# Patient Record
Sex: Male | Born: 1940 | Race: White
Health system: Southern US, Community
[De-identification: ages and names within clinical notes are randomized; demographics above are authoritative.]

## PROBLEM LIST (undated history)

## (undated) DIAGNOSIS — I2510 Atherosclerotic heart disease of native coronary artery without angina pectoris: Secondary | ICD-10-CM

## (undated) DIAGNOSIS — Z955 Presence of coronary angioplasty implant and graft: Secondary | ICD-10-CM

## (undated) DIAGNOSIS — G20 Parkinson's disease: Secondary | ICD-10-CM

## (undated) DIAGNOSIS — D649 Anemia, unspecified: Secondary | ICD-10-CM

## (undated) DIAGNOSIS — I10 Essential (primary) hypertension: Secondary | ICD-10-CM

## (undated) DIAGNOSIS — E1121 Type 2 diabetes mellitus with diabetic nephropathy: Secondary | ICD-10-CM

## (undated) DIAGNOSIS — E119 Type 2 diabetes mellitus without complications: Secondary | ICD-10-CM

## (undated) DIAGNOSIS — K7689 Other specified diseases of liver: Secondary | ICD-10-CM

## (undated) DIAGNOSIS — E785 Hyperlipidemia, unspecified: Secondary | ICD-10-CM

## (undated) DIAGNOSIS — G20A1 Parkinson's disease without dyskinesia, without mention of fluctuations: Secondary | ICD-10-CM

## (undated) DIAGNOSIS — Z951 Presence of aortocoronary bypass graft: Secondary | ICD-10-CM

## (undated) HISTORY — DX: Presence of coronary angioplasty implant and graft: Z95.5

## (undated) HISTORY — DX: Essential (primary) hypertension: I10

## (undated) HISTORY — DX: Hyperlipidemia, unspecified: E78.5

## (undated) HISTORY — DX: Atherosclerotic heart disease of native coronary artery without angina pectoris: I25.10

## (undated) HISTORY — PX: CARDIAC SURGERY: SHX584

---

## 2004-03-17 DIAGNOSIS — Z951 Presence of aortocoronary bypass graft: Secondary | ICD-10-CM

## 2004-03-17 HISTORY — DX: Presence of aortocoronary bypass graft: Z95.1

## 2013-08-28 DIAGNOSIS — N201 Calculus of ureter: Secondary | ICD-10-CM | POA: Insufficient documentation

## 2013-08-28 DIAGNOSIS — K7689 Other specified diseases of liver: Secondary | ICD-10-CM | POA: Insufficient documentation

## 2013-08-28 DIAGNOSIS — M25519 Pain in unspecified shoulder: Secondary | ICD-10-CM | POA: Insufficient documentation

## 2013-08-28 DIAGNOSIS — M545 Low back pain, unspecified: Secondary | ICD-10-CM | POA: Insufficient documentation

## 2013-08-28 DIAGNOSIS — E119 Type 2 diabetes mellitus without complications: Secondary | ICD-10-CM | POA: Insufficient documentation

## 2013-08-28 DIAGNOSIS — M109 Gout, unspecified: Secondary | ICD-10-CM | POA: Insufficient documentation

## 2013-08-28 DIAGNOSIS — K224 Dyskinesia of esophagus: Secondary | ICD-10-CM | POA: Insufficient documentation

## 2013-08-28 DIAGNOSIS — R351 Nocturia: Secondary | ICD-10-CM | POA: Insufficient documentation

## 2013-08-28 DIAGNOSIS — H469 Unspecified optic neuritis: Secondary | ICD-10-CM | POA: Insufficient documentation

## 2013-08-28 DIAGNOSIS — I10 Essential (primary) hypertension: Secondary | ICD-10-CM | POA: Insufficient documentation

## 2013-08-28 DIAGNOSIS — E7849 Other hyperlipidemia: Secondary | ICD-10-CM | POA: Insufficient documentation

## 2013-08-28 DIAGNOSIS — R430 Anosmia: Secondary | ICD-10-CM | POA: Insufficient documentation

## 2013-08-28 DIAGNOSIS — I2510 Atherosclerotic heart disease of native coronary artery without angina pectoris: Secondary | ICD-10-CM | POA: Insufficient documentation

## 2013-10-07 DIAGNOSIS — R911 Solitary pulmonary nodule: Secondary | ICD-10-CM | POA: Insufficient documentation

## 2014-04-25 DIAGNOSIS — F32A Depression, unspecified: Secondary | ICD-10-CM | POA: Insufficient documentation

## 2014-04-25 DIAGNOSIS — R251 Tremor, unspecified: Secondary | ICD-10-CM | POA: Insufficient documentation

## 2014-04-25 DIAGNOSIS — G20 Parkinson's disease: Secondary | ICD-10-CM | POA: Insufficient documentation

## 2015-07-10 DIAGNOSIS — E1121 Type 2 diabetes mellitus with diabetic nephropathy: Secondary | ICD-10-CM | POA: Insufficient documentation

## 2015-07-10 DIAGNOSIS — I10 Essential (primary) hypertension: Secondary | ICD-10-CM | POA: Insufficient documentation

## 2015-08-05 DIAGNOSIS — D729 Disorder of white blood cells, unspecified: Secondary | ICD-10-CM | POA: Insufficient documentation

## 2015-08-05 DIAGNOSIS — I209 Angina pectoris, unspecified: Secondary | ICD-10-CM | POA: Insufficient documentation

## 2015-08-05 DIAGNOSIS — E785 Hyperlipidemia, unspecified: Secondary | ICD-10-CM | POA: Insufficient documentation

## 2015-08-05 DIAGNOSIS — Z951 Presence of aortocoronary bypass graft: Secondary | ICD-10-CM | POA: Insufficient documentation

## 2016-10-28 DIAGNOSIS — F330 Major depressive disorder, recurrent, mild: Secondary | ICD-10-CM | POA: Insufficient documentation

## 2018-01-01 ENCOUNTER — Emergency Department (HOSPITAL_BASED_OUTPATIENT_CLINIC_OR_DEPARTMENT_OTHER)
Admission: EM | Admit: 2018-01-01 | Discharge: 2018-01-01 | Disposition: A | Discharge status: Home / Self Care | Payer: Medicare Other | Attending: Emergency Medicine | Admitting: Emergency Medicine

## 2018-01-01 ENCOUNTER — Encounter (HOSPITAL_BASED_OUTPATIENT_CLINIC_OR_DEPARTMENT_OTHER): Payer: Self-pay | Admitting: Emergency Medicine

## 2018-01-01 ENCOUNTER — Emergency Department (HOSPITAL_BASED_OUTPATIENT_CLINIC_OR_DEPARTMENT_OTHER): Payer: Medicare Other

## 2018-01-01 ENCOUNTER — Other Ambulatory Visit: Payer: Self-pay

## 2018-01-01 DIAGNOSIS — M545 Low back pain, unspecified: Secondary | ICD-10-CM

## 2018-01-01 DIAGNOSIS — Z951 Presence of aortocoronary bypass graft: Secondary | ICD-10-CM | POA: Insufficient documentation

## 2018-01-01 DIAGNOSIS — E119 Type 2 diabetes mellitus without complications: Secondary | ICD-10-CM | POA: Diagnosis not present

## 2018-01-01 DIAGNOSIS — G20 Parkinson's disease: Secondary | ICD-10-CM | POA: Diagnosis not present

## 2018-01-01 DIAGNOSIS — I10 Essential (primary) hypertension: Secondary | ICD-10-CM | POA: Insufficient documentation

## 2018-01-01 HISTORY — DX: Parkinson's disease: G20

## 2018-01-01 HISTORY — DX: Parkinson's disease without dyskinesia, without mention of fluctuations: G20.A1

## 2018-01-01 HISTORY — DX: Essential (primary) hypertension: I10

## 2018-01-01 HISTORY — DX: Type 2 diabetes mellitus without complications: E11.9

## 2018-01-01 HISTORY — DX: Presence of aortocoronary bypass graft: Z95.1

## 2018-01-01 MED ORDER — CYCLOBENZAPRINE HCL 10 MG PO TABS: 10.0000 mg | ORAL_TABLET | Freq: Two times a day (BID) | ORAL | 0 refills | Status: DC | PRN

## 2018-01-01 MED ORDER — KETOROLAC TROMETHAMINE 30 MG/ML IJ SOLN
15.0000 mg | Freq: Once | INTRAMUSCULAR | Status: AC
  Administered 2018-01-01: 15 mg via INTRAMUSCULAR
  Filled 2018-01-01: qty 1

## 2018-01-01 MED ORDER — CYCLOBENZAPRINE HCL 5 MG PO TABS: 5.0000 mg | ORAL_TABLET | Freq: Two times a day (BID) | ORAL | 0 refills | Status: AC | PRN

## 2018-01-01 NOTE — Discharge Instructions (Addendum)
You can take ibuprofen as prescribed over-the-counter, as needed for your pain.  For muscle pain or spasms, take Flexeril twice daily.  Do not drive or operate machinery while taking this medication.  Use ice and heat alternating 20 minutes on, 20 minutes off.  Please return or go to the closest emergency department if you develop any new or worsening symptoms.

## 2018-01-01 NOTE — ED Provider Notes (Signed)
[facility] EMERGENCY DEPARTMENT Provider Note   CSN: [id number] Arrival date & time: 01/01/18  1131     History   Chief Complaint Chief Complaint  Patient presents with  . Back Pain    HPI Erik Velazquez is a 77 y.o. male with history of Parkinson's disease, hypertension, diabetes, coronary artery disease who presents with a 3-day history of left-sided low back pain.  Patient reports his pain began after a trip to [place] when he was lifting suitcases and sitting on the airplane a lot.  He reports it improved, however flared up again last week when he lifted a suitcase.  He has had pain across his low back, worse in the left side.  It is worse when he moves.  He denies any numbness or tingling, saddle anesthesia, bowel or bladder incontinence, history of IVDU, cancer, procedure to back, or fevers.  He denies any chest pain, shortness of breath.  He has no personal history of AAA.  HPI  Past Medical History:  Diagnosis Date  . Diabetes mellitus without complication (HCC)   . 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 Hypertension   . Parkinson disease (HCC)     There are no active problems to display for this patient.   Past Surgical History:  Procedure Laterality Date  . CARDIAC SURGERY          Home Medications    Prior to Admission medications   Medication Sig Start Date End Date Taking? Authorizing Provider  cyclobenzaprine (FLEXERIL) 10 MG tablet Take 1 tablet (10 mg total) by mouth 2 (two) times daily as needed for muscle spasms. 01/01/18   Emi HolesLaw, Earnstine Meinders M, PA-C    Family History History reviewed. No pertinent family history.  Social History Social History   Tobacco Use  . Smoking status: Never Smoker  . Smokeless tobacco: Never Used  Substance Use Topics  . Alcohol use: Not Currently    Frequency: Never  . Drug use: Never     Allergies   Patient has no allergy information on record.   Review of Systems Review of Systems  Constitutional:  Negative for fever.  Respiratory: Negative for shortness of breath.   Cardiovascular: Negative for chest pain.  Genitourinary: Negative for dysuria and frequency.  Musculoskeletal: Positive for back pain.  Neurological: Negative for numbness.     Physical Exam Updated Vital Signs BP 140/83 (BP Location: Right Arm)   Pulse 70   Temp 98 F (36.7 C) (Oral)   Resp 16   SpO2 99%   Physical Exam  Constitutional: He appears well-developed and well-nourished. No distress.  HENT:  Head: Normocephalic and atraumatic.  Mouth/Throat: Oropharynx is clear and moist. No oropharyngeal exudate.  Eyes: Pupils are equal, round, and reactive to light. Conjunctivae are normal. Right eye exhibits no discharge. Left eye exhibits no discharge. No scleral icterus.  Neck: Normal range of motion. Neck supple. No thyromegaly present.  Cardiovascular: Normal rate, regular rhythm, normal heart sounds and intact distal pulses. Exam reveals no gallop and no friction rub.  No murmur heard. Pulmonary/Chest: Effort normal and breath sounds normal. No stridor. No respiratory distress. He has no wheezes. He has no rales.  Abdominal: Soft. Bowel sounds are normal. He exhibits no distension and no pulsatile midline mass. There is no tenderness. There is no rebound and no guarding.  Musculoskeletal: He exhibits no edema.  Some left-sided lumbar paraspinal tenderness, no midline cervical, thoracic, or lumbar tenderness  Lymphadenopathy:    He has no  cervical adenopathy.  Neurological: He is alert. Coordination normal.  Skin: Skin is warm and dry. No rash noted. He is not diaphoretic. No pallor.  Psychiatric: He has a normal mood and affect.  Nursing note and vitals reviewed.    ED Treatments / Results  Labs (all labs ordered are listed, but only abnormal results are displayed) Labs Reviewed - No data to display  EKG None  Radiology Dg Lumbar Spine Complete  Result Date: 01/01/2018 CLINICAL DATA:  77 y/o M;  lifting injury 3 days ago. Bilateral lower back pain. EXAM: LUMBAR SPINE - COMPLETE 4+ VIEW COMPARISON:  None. FINDINGS: Five lumbar type non-rib-bearing vertebral bodies. Normal lumbar lordosis without listhesis. Vertebral body and disc space heights are maintained. Multilevel endplate marginal osteophytes. Calcific atherosclerosis of the abdominal aorta. Mild lower lumbar facet arthrosis. IMPRESSION: 1. No acute fracture or dislocation. 2. Mild lumbar spondylosis. Electronically Signed   By: Mitzi Hansen M.D.   On: 01/01/2018 13:58    Procedures Procedures (including critical care time)  Medications Ordered in ED Medications  ketorolac (TORADOL) 30 MG/ML injection 15 mg (15 mg Intramuscular Given 01/01/18 1321)     Initial Impression / Assessment and Plan / ED Course  I have reviewed the triage vital signs and the nursing notes.  Pertinent labs & imaging results that were available during my care of the patient were reviewed by me and considered in my medical decision making (see chart for details).  Clinical Course as of Jan 02 1551  Sat Jan 01, 2018  [id number] 77 year old male here with some low back pain.  He attributes it to lifting something heavy and he has had this happen before.  He is otherwise neuro intact and is got a benign abdominal exam.  X-rays do not show any obvious fractures.  We will treat him symptomatically and have him follow-up with his primary care doctor.   [MB]    Clinical Course User Index [MB] Terrilee Files, MD    Patient with low back pain after lifting a heavy suitcase.  Suspect musculoskeletal pain.  He is neurovascularly intact.  X-ray is negative for acute findings but shows mild spondylosis.  Patient has no pulsatile mass.  He has a mechanism of injury.  Low suspicion of AAA.  Patient is completely improved after 15 mg of Toradol.  Patient is requesting Flexeril.  Patient given low-dose Flexeril, however there was an error in E prescribing and  patient was prescribed 10 mg instead.  I called [facility] and several other [facility] multiple times and could not contact anyone.  There is no voicemail set up.  I also tried to call the patient several times and there is also no voicemail set up.  The prescription was changed electronically however I was unable to confirm the change or make the patient aware to take half a tablet.  Patient also evaluated by my attending, Dr. Charm Barges, who guided the patient's management and agrees with plan.  Final Clinical Impressions(s) / ED Diagnoses   Final diagnoses:  Acute left-sided low back pain without sciatica    ED Discharge Orders         Ordered    cyclobenzaprine (FLEXERIL) 10 MG tablet  2 times daily PRN     01/01/18 1440           Emi Holes, [place] 01/01/18 1825    Terrilee Files, MD 01/02/18 [phone]

## 2018-01-01 NOTE — ED Triage Notes (Signed)
Pt c/o left sided lower back pain x 3 days with radiation down leg. No urinary sx or radiation to abdomen

## 2019-01-31 IMAGING — CR DG LUMBAR SPINE COMPLETE 4+V
5 series · 5 of 5 positions shown · non-contrast
Comparison: None.

CLINICAL DATA: 77 y/o M; lifting injury 3 days ago. Bilateral lower
back pain.

EXAM:
LUMBAR SPINE - COMPLETE 4+ VIEW

[t l-spine a.p.]
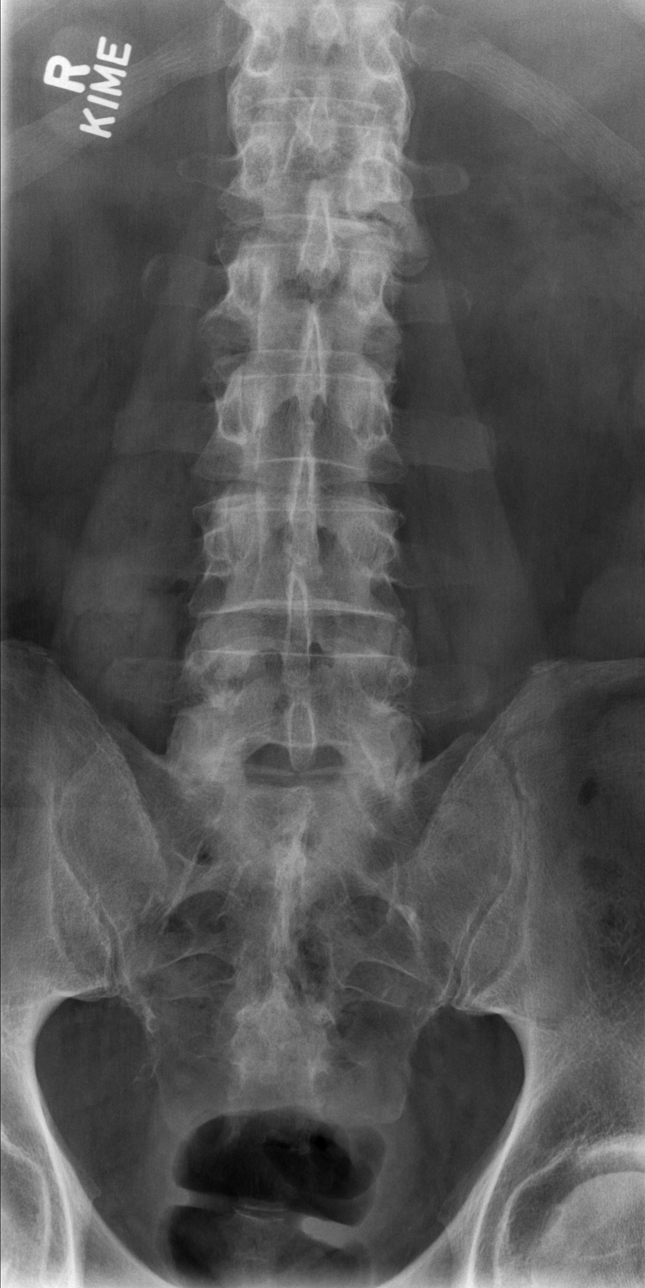

[t l-spine oblique exposure (1 of 2)]
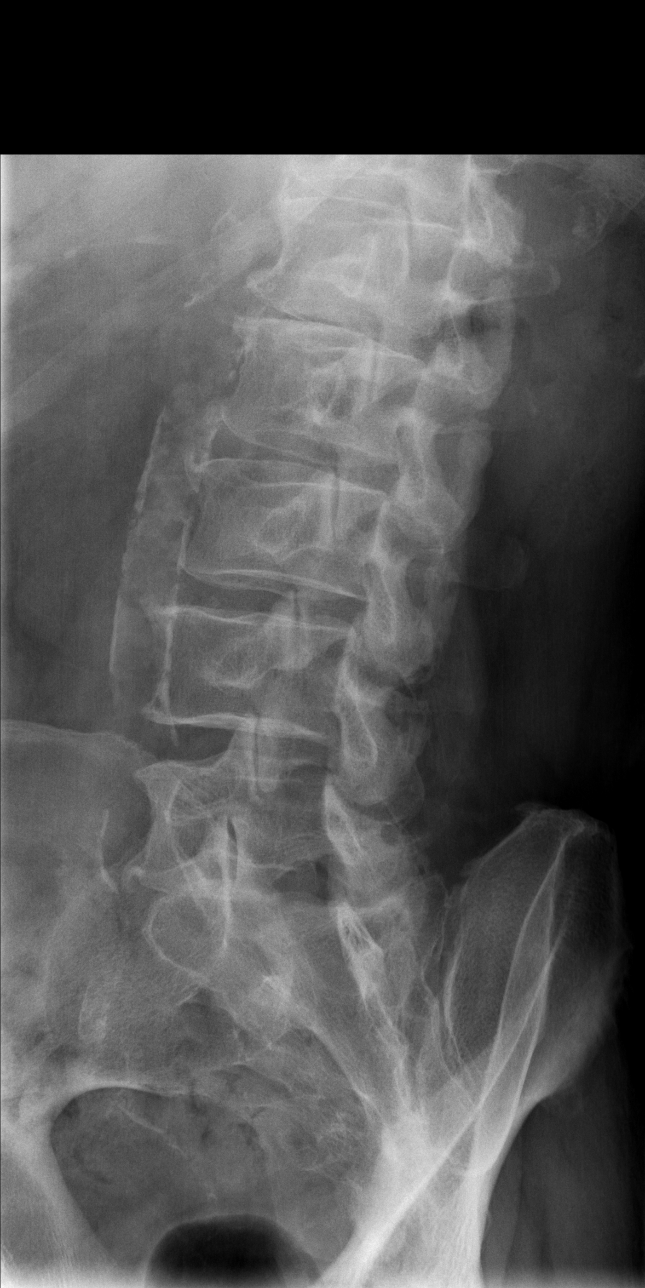

[t l-spine oblique exposure (2 of 2)]
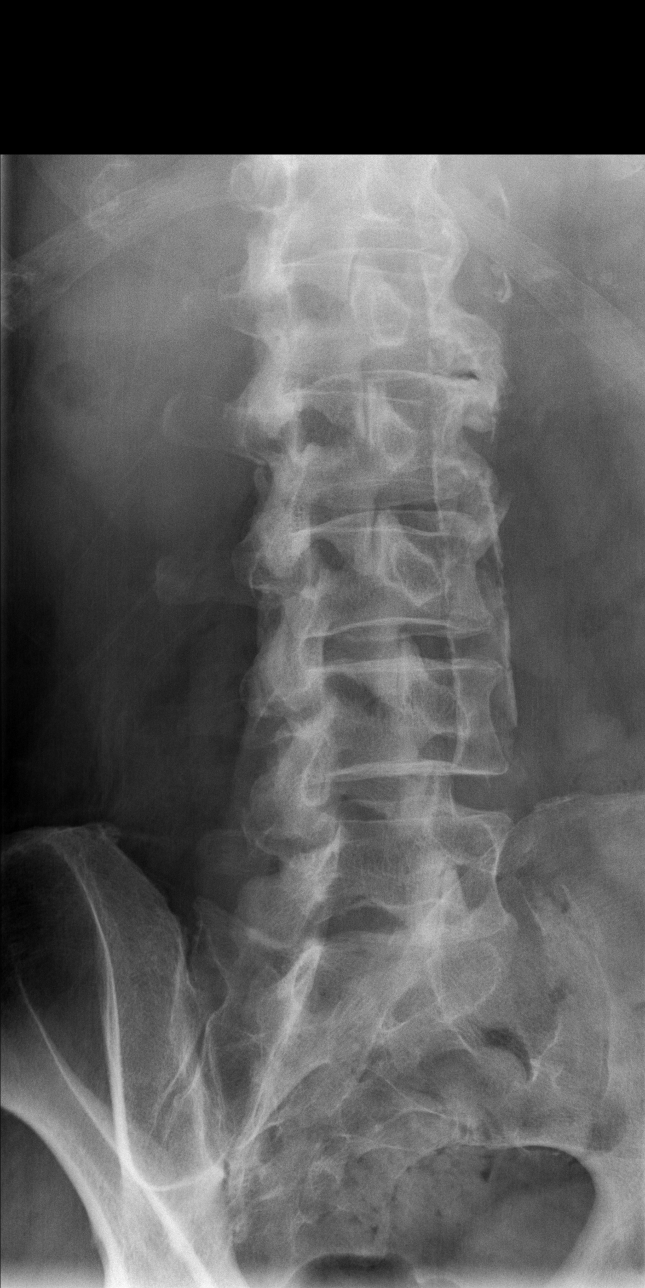

[t l-spine lat]
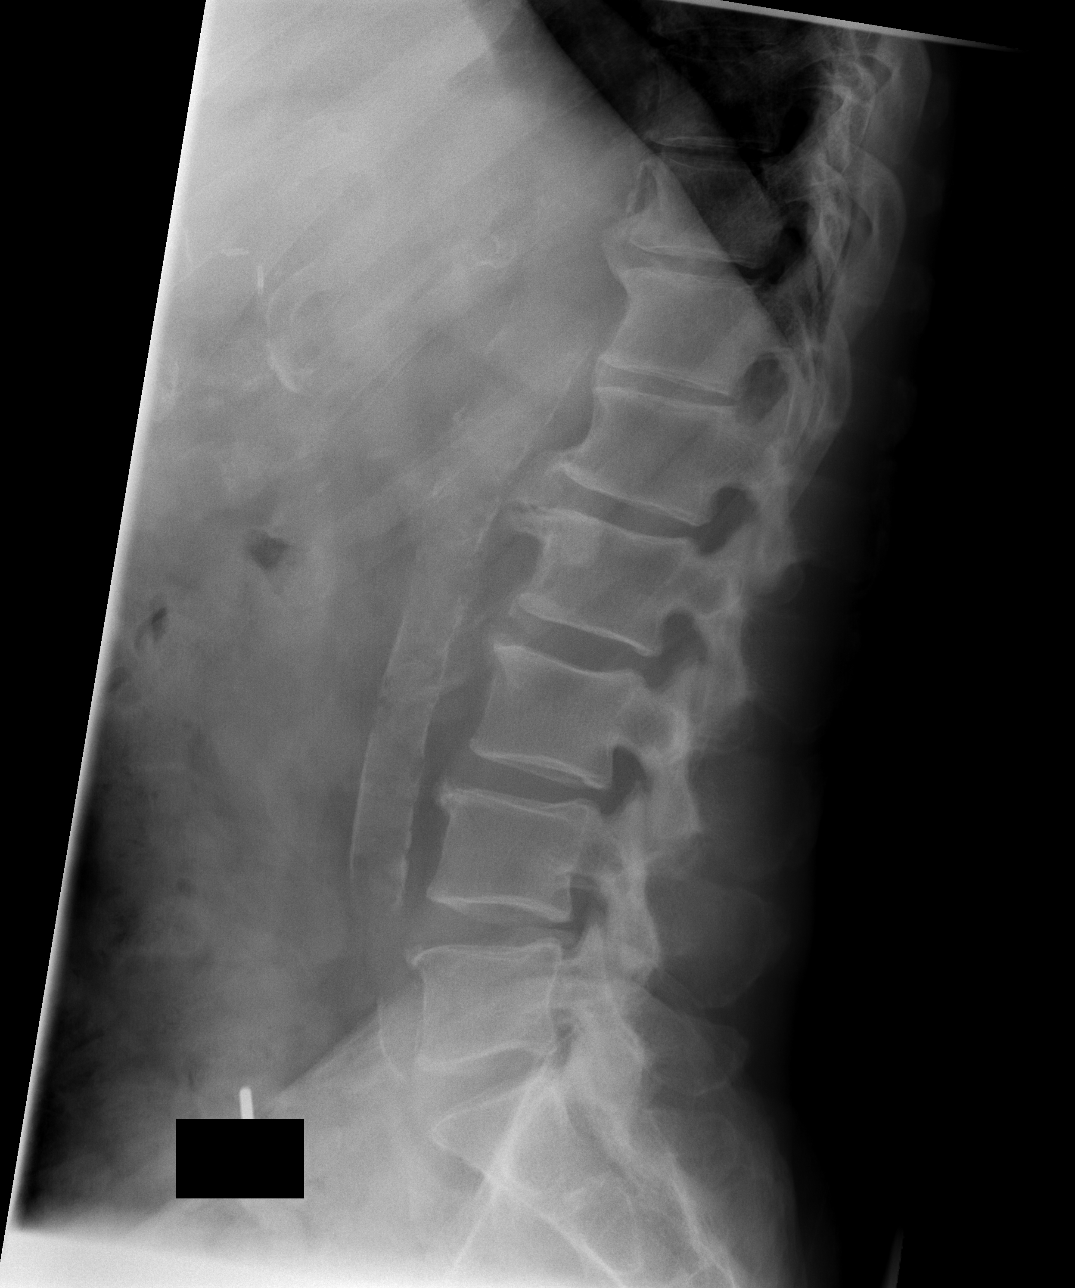

[t l-spine l5-s1 spot]
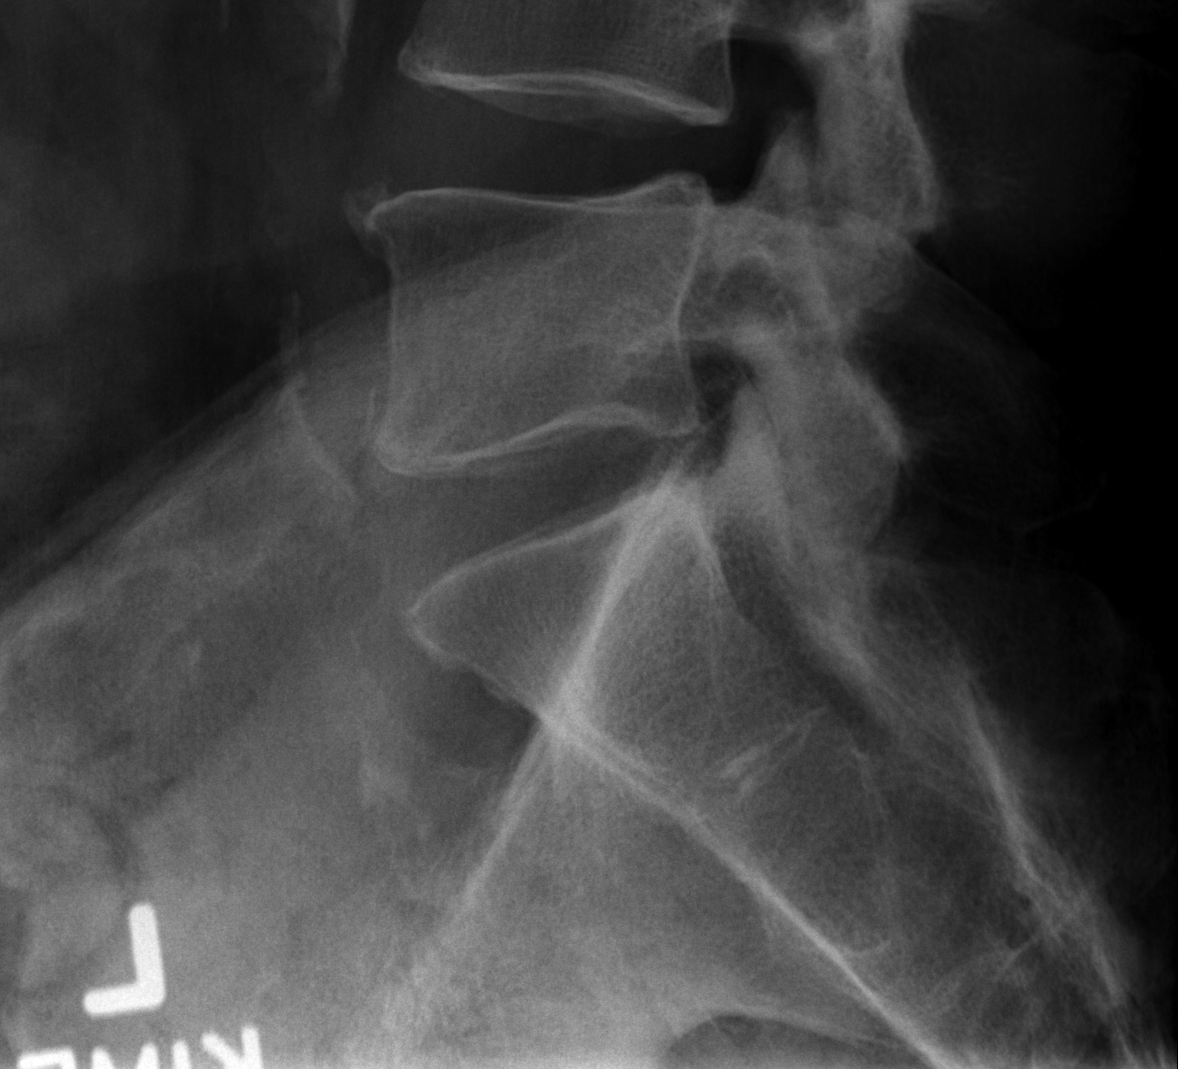

[5 of 5 positions shown; findings below may reference images not displayed]

FINDINGS: Five lumbar type non-rib-bearing vertebral bodies. Normal lumbar
lordosis without listhesis. Vertebral body and disc space heights
are maintained. Multilevel endplate marginal osteophytes. Calcific
atherosclerosis of the abdominal aorta. Mild lower lumbar facet
arthrosis.
IMPRESSION: 1. No acute fracture or dislocation.
2. Mild lumbar spondylosis.

## 2019-03-23 ENCOUNTER — Ambulatory Visit (INDEPENDENT_AMBULATORY_CARE_PROVIDER_SITE_OTHER): Payer: Self-pay | Admitting: Family Medicine

## 2019-04-06 ENCOUNTER — Encounter (INDEPENDENT_AMBULATORY_CARE_PROVIDER_SITE_OTHER): Payer: Self-pay | Admitting: Family Medicine

## 2019-04-06 ENCOUNTER — Ambulatory Visit (INDEPENDENT_AMBULATORY_CARE_PROVIDER_SITE_OTHER): Payer: No Typology Code available for payment source | Admitting: Family Medicine

## 2019-04-06 VITALS — BP 124/64 | HR 84 | Temp 97.6°F | Resp 14 | Wt 172.4 lb

## 2019-04-06 DIAGNOSIS — F339 Major depressive disorder, recurrent, unspecified: Secondary | ICD-10-CM

## 2019-04-06 DIAGNOSIS — E782 Mixed hyperlipidemia: Secondary | ICD-10-CM

## 2019-04-06 DIAGNOSIS — E119 Type 2 diabetes mellitus without complications: Secondary | ICD-10-CM

## 2019-04-06 DIAGNOSIS — I10 Essential (primary) hypertension: Secondary | ICD-10-CM

## 2019-04-06 DIAGNOSIS — G20 Parkinson's disease: Secondary | ICD-10-CM

## 2019-04-06 DIAGNOSIS — M109 Gout, unspecified: Secondary | ICD-10-CM

## 2019-04-06 MED ORDER — LOSARTAN POTASSIUM 25 MG PO TABS
ORAL_TABLET | ORAL | 3 refills | Status: DC
Start: 2019-04-06 — End: 2019-07-03

## 2019-04-06 MED ORDER — ACCU-CHEK AVIVA PLUS W/DEVICE KIT
PACK | 1 refills | Status: DC
Start: 2019-04-06 — End: 2019-05-12

## 2019-04-06 MED ORDER — ATORVASTATIN CALCIUM 20 MG PO TABS
ORAL_TABLET | ORAL | 4 refills | Status: DC
Start: 2019-04-06 — End: 2019-07-03

## 2019-04-06 MED ORDER — METFORMIN HCL 1000 MG PO TABS
ORAL_TABLET | ORAL | 4 refills | Status: DC
Start: 2019-04-06 — End: 2019-07-03

## 2019-04-06 MED ORDER — SERTRALINE HCL 25 MG PO TABS
ORAL_TABLET | ORAL | 4 refills | Status: DC
Start: 2019-04-06 — End: 2019-07-03

## 2019-04-06 MED ORDER — ALLOPURINOL 300 MG PO TABS
ORAL_TABLET | ORAL | 4 refills | Status: DC
Start: 2019-04-06 — End: 2019-07-03

## 2019-04-06 NOTE — Progress Notes (Signed)
[facility] [place] - AN [place] PART[facility]                       Date of Exam: 04/06/2019        Patient ID: [name] [name] is a 79 y.o. male.  Attending Physician: [name], MD        Chief Complaint:    Chief Complaint   Patient presents with   . Establish Care   . Medication Refill     losartan               HPI:    New patient  Here with son  PD VV with neuro 3 weeks ago    DM: lows but has not checked bs   Went to urgent care for med refills   On metformin and glipizide.     HTN: bp stable  Needs refill on losartan     Gout: takes allopurinol daily   Has not had gouty attack in years    HLD:   Needs refill    He has depression  He would like to continue zoloft.             Problem List:    Patient Active Problem List   Diagnosis   . Parkinson disease   . Type 2 diabetes mellitus without complication, without long-term current use of insulin   . Episode of recurrent major depressive disorder, unspecified depression episode severity             Current Meds:    Current Outpatient Medications on File Prior to Visit   Medication Sig Dispense Refill   . Ascorbic Acid (VITAMIN C PO) Take 1,000 mg by mouth     . aspirin EC 81 MG EC tablet Take by mouth     . carbidopa-levodopa (SINEMET) 25-100 MG per tablet TAKE 5 TABLETS DAILY AS DIRECTED  1.5 EACH AT 8 AM AND 1 PM, AND 1 TABLET EACH AT 6 PM AND 11PM     . Coenzyme Q10 (CoQ10) 200 MG Cap Take 400 mg by mouth     . glipiZIDE XL (GLUCOTROL XL) 5 MG 24 hr tablet Take 5 mg by mouth daily     . glucosamine-chondroitin 500-400 MG tablet Take 1 tablet by mouth     . glucose blood (Precision QID Test) test strip 1 each by Other route daily.     . Lancets Misc 1 each     . Multiple Vitamins-Minerals (CENTRUM SILVER 50+MEN PO) Take 1 tablet by mouth     . nitroglycerin (NITROSTAT) 0.4 MG SL tablet Place 0.4 mg under the tongue     . vitamin C (ASCORBIC ACID) 500 MG tablet Take 500 mg by mouth       No current facility-administered medications on  file prior to visit.           Allergies:    No Known Allergies          Past Surgical History:    History reviewed. No pertinent surgical history.        Family History:    History reviewed. No pertinent family history.        Social History:    Social History     Tobacco Use   . Smoking status: Never Smoker   . Smokeless tobacco: Never Used   Substance Use Topics   . Alcohol use: Not on file   . Drug use:  Not on file          The following sections were reviewed this encounter by the provider:   Tobacco  Allergies  Meds  Problems  Med Hx  Surg Hx  Fam Hx             Vital Signs:    BP 124/64   Pulse 84   Temp 97.6 F (36.4 C)   Resp 14   Wt 78.2 kg (172 lb 6.4 oz)          ROS:    Review of Systems   Constitutional: Negative for activity change and appetite change.   HENT: Negative for ear discharge and ear pain.    Eyes: Negative for discharge.   Respiratory: Negative for apnea and shortness of breath.    Cardiovascular: Negative for chest pain.   Gastrointestinal: Negative for abdominal distention.   Endocrine: Negative for cold intolerance.   Genitourinary: Negative for difficulty urinating.   Musculoskeletal: Negative for arthralgias.              Physical Exam:    Physical Exam  Constitutional:       Appearance: Normal appearance.   HENT:      Head: Normocephalic.      Nose: Nose normal.   Eyes:      Extraocular Movements: Extraocular movements intact.   Cardiovascular:      Rate and Rhythm: Normal rate.      Heart sounds: No murmur.   Pulmonary:      Effort: Pulmonary effort is normal.      Breath sounds: Normal breath sounds.   Abdominal:      General: There is no distension.      Palpations: There is no mass.   Musculoskeletal:      Right lower leg: No edema.      Left lower leg: No edema.   Skin:     General: Skin is dry.   Neurological:      Mental Status: He is alert.      Comments: Pin rolling tremor BL   Psychiatric:         Mood and Affect: Mood normal.         Thought Content: Thought  content normal.         Judgment: Judgment normal.              Assessment:    1. Parkinson disease  - Neurology Referral: Lynann Bologna, MD ([place])  - Referral to Physical Therapy-[facility]    2. Type 2 diabetes mellitus without complication, without long-term current use of insulin  - Comprehensive metabolic panel  - Lipid panel  - Hemoglobin A1C  - Urine Microalbumin Random  - metFORMIN (GLUCOPHAGE) 1000 MG tablet; TAKE 1 TABLET (1,000 MG TOTAL) BY MOUTH 2 TIMES DAILY WITH MEALS.  Dispense: 180 tablet; Refill: 4  - sertraline (ZOLOFT) 25 MG tablet; TAKE 1 TABLET (25 MG TOTAL) BY MOUTH ONCE DAILY.  Dispense: 90 tablet; Refill: 4  - Blood Glucose Monitoring Suppl (Accu-Chek Aviva Plus) w/Device Kit; Use as Directed  Dispense: 1 kit; Refill: 1    3. Episode of recurrent major depressive disorder, unspecified depression episode severity    4. Gout, unspecified cause, unspecified chronicity, unspecified site  - allopurinol (ZYLOPRIM) 300 MG tablet; TAKE 1 TABLET (300 MG TOTAL) BY MOUTH DAILY FOR 30 DAYS. NO FURTHER REFILLS WITH OUT OFFICE VISIT  Dispense: 90 tablet; Refill: 4    5. Mixed  hyperlipidemia  - atorvastatin (LIPITOR) 20 MG tablet; TAKE 1 TABLET BY MOUTH AT BEDTIME, NEEDS OFFICE VISIT FOR MORE REFILLS  Dispense: 90 tablet; Refill: 4    6. Essential hypertension  - losartan (COZAAR) 25 MG tablet; TAKE 1 TABLET BY MOUTH DAILY. MUST MAKE APPOINTMENT FOR ADDITIONAL REFILLS  Dispense: 90 tablet; Refill: 3            Plan:        New patient from [place], here with son who he lives with. Needs new neuroligst  Check labs for DM and HLD. IF a1c less than 7 will discontinue glipizide.   Start checking BS daily.          Follow-up:    Return in about 4 weeks (around 05/04/2019) for dm.         [name], MD

## 2019-04-07 ENCOUNTER — Encounter (INDEPENDENT_AMBULATORY_CARE_PROVIDER_SITE_OTHER): Payer: Self-pay | Admitting: Family Medicine

## 2019-04-08 LAB — COMPREHENSIVE METABOLIC PANEL
ALT: 15 IU/L (ref 0–44)
AST (SGOT): 23 IU/L (ref 0–40)
African American eGFR: 78 mL/min/{1.73_m2} (ref >59)
Albumin/Globulin Ratio: 1.6 (ref 1.2–2.2)
Albumin: 4.2 g/dL (ref 3.7–4.7)
Alkaline Phosphatase: 81 IU/L (ref 39–117)
BUN / Creatinine Ratio: 19 (ref 10–24)
BUN: 20 mg/dL (ref 8–27)
Bilirubin, Total: 0.7 mg/dL (ref 0.0–1.2)
CO2: 22 mmol/L (ref 20–29)
Calcium: 9.7 mg/dL (ref 8.6–10.2)
Chloride: 101 mmol/L (ref 96–106)
Creatinine: 1.06 mg/dL (ref 0.76–1.27)
Globulin, Total: 2.7 g/dL (ref 1.5–4.5)
Glucose: 164 mg/dL — ABNORMAL HIGH (ref 65–99)
Potassium: 4.4 mmol/L (ref 3.5–5.2)
Protein, Total: 6.9 g/dL (ref 6.0–8.5)
Sodium: 140 mmol/L (ref 134–144)
non-African American eGFR: 68 mL/min/{1.73_m2} (ref >59)

## 2019-04-08 LAB — LIPID PANEL
Cholesterol / HDL Ratio: 3.2 ratio (ref 0.0–5.0)
Cholesterol: 168 mg/dL (ref 100–199)
HDL: 52 mg/dL (ref >39)
LDL Chol Calculated (NIH): 97 mg/dL (ref 0–99)
Triglycerides: 104 mg/dL (ref 0–149)
VLDL Calculated: 19 mg/dL (ref 5–40)

## 2019-04-08 LAB — HEMOGLOBIN A1C: Hemoglobin A1C: 4.9 % (ref 4.8–5.6)

## 2019-04-08 LAB — MICROALBUMIN, RANDOM URINE
Creatinine, UR: 166.7 mg/dL
Microalb/Crt. Ratio: 4 mg/g creat (ref 0–29)
Microalbumin, UR: 7.4 ug/mL

## 2019-04-12 ENCOUNTER — Other Ambulatory Visit (INDEPENDENT_AMBULATORY_CARE_PROVIDER_SITE_OTHER): Payer: Self-pay | Admitting: Family Medicine

## 2019-04-12 DIAGNOSIS — E119 Type 2 diabetes mellitus without complications: Secondary | ICD-10-CM

## 2019-04-12 MED ORDER — GLUCOSE BLOOD VI STRP
ORAL_STRIP | 1 refills | Status: DC
Start: 2019-04-12 — End: 2019-05-12

## 2019-04-12 MED ORDER — ACCU-CHEK SOFT TOUCH LANCETS MISC
1 refills | Status: DC
Start: 2019-04-12 — End: 2019-05-12

## 2019-04-18 ENCOUNTER — Telehealth (INDEPENDENT_AMBULATORY_CARE_PROVIDER_SITE_OTHER): Payer: No Typology Code available for payment source | Admitting: Family Medicine

## 2019-04-18 ENCOUNTER — Encounter (INDEPENDENT_AMBULATORY_CARE_PROVIDER_SITE_OTHER): Payer: Self-pay | Admitting: Family Medicine

## 2019-04-18 DIAGNOSIS — E1169 Type 2 diabetes mellitus with other specified complication: Secondary | ICD-10-CM

## 2019-04-18 DIAGNOSIS — F330 Major depressive disorder, recurrent, mild: Secondary | ICD-10-CM

## 2019-04-18 NOTE — Addendum Note (Signed)
Addended by: Nonie Hoyer on: 04/18/2019 10:56 AM     Modules accepted: Orders

## 2019-04-18 NOTE — Progress Notes (Signed)
[facility] [place] - AN [place] PARTNER                       Date of Virtual Visit: 04/18/2019 8:52 AM        Patient ID: [name] [name] is a 79 y.o. male.  Attending Physician: [name], MD       Telemedicine Eligibility:    State Location:  [x]  [place]  []  [facility] []  [place]  []  Other:    Patient ID VERIFIED:  Patient is known to provider      Visit terminated since not appropriate for virtual care:  [x]  N/A  []  Reason:         Chief Complaint:    Chief Complaint   Patient presents with   . Follow-up               HPI:    79 year old male seen on video visit with his son last visit he established care he had a history of diabetes for which she is on glipizide and had noted a few low sugars we checked an A1c which was low at 4.9 he has been advised to stop glipizide he reports he has not taken glipizide in the past week.  He has a history of Parkinson's disease and will start physical therapy and will see a neurologist    He has a history of depression which she reports is controlled                Problem List:    Patient Active Problem List   Diagnosis   . Parkinson's disease   . Diabetes mellitus type 2, controlled   . Mild episode of recurrent major depressive disorder   . Neutrophilic leukocytosis   . Low back pain   . Hypertension   . Hepatic cyst   . Dyslipidemia   . Gouty arthropathy   . Diffuse esophageal spasm   . Diabetic nephropathy associated with type 2 diabetes mellitus   . Depression   . CAD (coronary artery disease)   . Anosmia   . Benign essential hypertension   . Angina pectoris   . Nocturia   . Optic neuritis   . Other and unspecified hyperlipidemia   . Pulmonary nodule, left   . S/P CABG (coronary artery bypass graft)   . Shoulder joint pain   . Tremor   . Ureteric stone             Current Meds:    Outpatient Medications Marked as Taking for the 04/18/19 encounter (Telemedicine Visit) with [name], MD   Medication Sig  Dispense Refill   . allopurinol (ZYLOPRIM) 300 MG tablet TAKE 1 TABLET (300 MG TOTAL) BY MOUTH DAILY FOR 30 DAYS. NO FURTHER REFILLS WITH OUT OFFICE VISIT 90 tablet 4   . Ascorbic Acid (VITAMIN C PO) Take 1,000 mg by mouth     . aspirin EC 81 MG EC tablet Take by mouth     . atorvastatin (LIPITOR) 20 MG tablet TAKE 1 TABLET BY MOUTH AT BEDTIME, NEEDS OFFICE VISIT FOR MORE REFILLS 90 tablet 4   . Blood Glucose Monitoring Suppl (Accu-Chek Aviva Plus) w/Device Kit Use as Directed 1 kit 1   . carbidopa-levodopa (SINEMET) 25-100 MG per tablet TAKE 5 TABLETS DAILY AS DIRECTED  1.5 EACH AT 8 AM AND 1 PM, AND 1 TABLET EACH AT 6 PM AND 11PM     .  Coenzyme Q10 (CoQ10) 200 MG Cap Take 400 mg by mouth     . glucosamine-chondroitin 500-400 MG tablet Take 1 tablet by mouth     . glucose blood test strip Test fasting blood sugar once daily 100 each 1   . Lancets (accu-chek soft touch) lancets Check fasting blood sugar once daily 100 each 1   . losartan (COZAAR) 25 MG tablet TAKE 1 TABLET BY MOUTH DAILY. MUST MAKE APPOINTMENT FOR ADDITIONAL REFILLS 90 tablet 3   . metFORMIN (GLUCOPHAGE) 1000 MG tablet TAKE 1 TABLET (1,000 MG TOTAL) BY MOUTH 2 TIMES DAILY WITH MEALS. 180 tablet 4   . Multiple Vitamins-Minerals (CENTRUM SILVER 50+MEN PO) Take 1 tablet by mouth     . nitroglycerin (NITROSTAT) 0.4 MG SL tablet Place 0.4 mg under the tongue     . sertraline (ZOLOFT) 25 MG tablet TAKE 1 TABLET (25 MG TOTAL) BY MOUTH ONCE DAILY. 90 tablet 4   . vitamin C (ASCORBIC ACID) 500 MG tablet Take 500 mg by mouth            Allergies:    No Known Allergies          Past Surgical History:    History reviewed. No pertinent surgical history.        Family History:    History reviewed. No pertinent family history.        Social History:    Social History     Tobacco Use   . Smoking status: Never Smoker   . Smokeless tobacco: Never Used   Substance Use Topics   . Alcohol use: Not on file   . Drug use: Not on file          The following sections were  reviewed this encounter by the provider:   Tobacco  Allergies  Meds  Problems  Med Hx  Surg Hx  Fam Hx             Vital Signs:    There were no vitals taken for this visit.         ROS:    Review of Systems   Constitutional: Negative for activity change and appetite change.   HENT: Negative for ear discharge and ear pain.    Eyes: Negative for discharge.   Respiratory: Negative for apnea and shortness of breath.    Cardiovascular: Negative for chest pain.   Gastrointestinal: Negative for abdominal distention.   Endocrine: Negative for cold intolerance.   Genitourinary: Negative for difficulty urinating.   Musculoskeletal: Negative for arthralgias.              Physical Exam:    Physical Exam   GENERAL APPEARANCE: alert, in no acute distress, pleasant, well nourished.   HEAD: normal appearance  EYES: no discharge  EARS: normal hearing  NECK/THYROID: appearance -supple  PSYCH: appropriate affect, appropriate mood, normal speech, normal attention        Assessment:    1. Controlled type 2 diabetes mellitus with other specified complication, without long-term current use of insulin    2. Mild episode of recurrent major depressive disorder            Plan:      79 year old multiple chronic conditions, does STOP glipizide, no need to check sugars,   F/u 4 weeks ensure he has f/u with neuro.           Follow-up:    Return in about 4 weeks (around 05/16/2019).  Jacqualine Mau, MD

## 2019-04-22 ENCOUNTER — Encounter (INDEPENDENT_AMBULATORY_CARE_PROVIDER_SITE_OTHER): Payer: Self-pay

## 2019-04-24 ENCOUNTER — Encounter (INDEPENDENT_AMBULATORY_CARE_PROVIDER_SITE_OTHER): Payer: Self-pay

## 2019-04-25 ENCOUNTER — Ambulatory Visit (INDEPENDENT_AMBULATORY_CARE_PROVIDER_SITE_OTHER): Payer: No Typology Code available for payment source

## 2019-04-25 DIAGNOSIS — Z23 Encounter for immunization: Secondary | ICD-10-CM

## 2019-05-04 ENCOUNTER — Ambulatory Visit: Payer: No Typology Code available for payment source | Attending: Family Medicine

## 2019-05-04 ENCOUNTER — Encounter (INDEPENDENT_AMBULATORY_CARE_PROVIDER_SITE_OTHER): Payer: Self-pay

## 2019-05-04 DIAGNOSIS — M6289 Other specified disorders of muscle: Secondary | ICD-10-CM | POA: Insufficient documentation

## 2019-05-04 DIAGNOSIS — G20 Parkinson's disease: Secondary | ICD-10-CM | POA: Insufficient documentation

## 2019-05-04 DIAGNOSIS — R269 Unspecified abnormalities of gait and mobility: Secondary | ICD-10-CM | POA: Insufficient documentation

## 2019-05-04 DIAGNOSIS — R2689 Other abnormalities of gait and mobility: Secondary | ICD-10-CM

## 2019-05-04 DIAGNOSIS — R279 Unspecified lack of coordination: Secondary | ICD-10-CM | POA: Insufficient documentation

## 2019-05-04 NOTE — PT Eval Note (Signed)
[facility]  [address], [address]  [place], [place]  [address]  Phone:  [phone]  Fax:  [phone]    PHYSICAL THERAPY EVALUATION AND PLAN OF CARE      Referred By: [name], MD    *I agree to the plan of care stated below*                                                                                                                                           Physician Signature      Date      PATIENT: [name] [name] DOB: Sep 11, 1940   MR #: [id number]  AGE: 79 y.o.    FACILITY PROVIDER #: [id number] PRIMARY MD: [name], MD    HICN# Medicare Sub. Num: [id number] DIAGNOSES: Parkinson's disease [G20]      Date of Service PT Received On: 05/04/19   Treatment Time Start Time: 0906 to Stop Time: 1000   Time Calculation Time Calculation (min): 54 min   Visit #  1/30   Units Billed PT Evaluation  $ PT Evaluation Moderate Complexity (97162): 1 Procedure Therapeutic Interventions  $ PT Therapeutic Activity (97530): 1 unit     CERTIFICATION PERIOD:  05-04-19 to 08-03-19    TREATMENT DIAGNOSIS:  Gait Abnormality R26.9, Abnormal increase muscle tightness M62.89 and Lack of Coordination R27.9      Medications:  has a current medication list which includes the following prescription(s): allopurinol, ascorbic acid, aspirin ec, atorvastatin, accu-chek aviva plus, carbidopa-levodopa, coq10, glucosamine-chondroitin, glucose blood, accu-chek soft touch, losartan, metformin, multiple vitamins-minerals, nitroglycerin, sertraline, and vitamin c.    Precautions:   HBP / CAD  Diabetic     Allergies:  No Known Allergies    EXAMINATION / EVALUATION:    Past Medical History:  [name] [name]  has no past medical history on file.    Past Surgical History:  [name] [name]  has no past surgical history on file.      History of present illness:    [name] [name] is a 79 y.o. male recently moved in with son since covid as he was not taking care of himself.  He fell 3 times 6  months ago when first arrived.  Son is making him come to PT to work on balance/ fall prevention/strength.     Social History:  widowed, retired and lives with son      Home Environment:  TH no STE, 6-8 steps to main level, BR on 3rd level w/ 16 steps.      DME Owned:  No AD  Grab bar in shower    Patient presents for physical therapy today with son.     Level of function:    I ambulating around home and in neighborhood.  Walks 1-2 x/wk in neighborhood.   I ADL's  I light meal  prep, light clean up-empty dish washer  Has a lift chair to help get up later in day when fatigued  Last fall was at Christmas time.     Patient Goal: get stronger-especially upper body. Easier w/ bed mobility. Easier car transfers.      Pain:    Patient denies pain today.    REVIEW OF SYSTEMS:    Communication: Son reports in the evenings his voice wanes-volume of voice declines.    Alert and oriented to person, place and time.  Emotional/Behavioral responses appear appropriate at this time.  Demonstrates ability to make needs known.    Cardiopulmonary:  Seated Vital Signs:  right upper extremity:  Blood Pressure:  119/67  Pulse: 76    Integumentary:  Bruised/healed scars B lower legs/knees  from falls.      Musculoskeletal System:   ROM:  UE's WFL  LE's tight hams, hip flexors, quads, ankle df to neutral  Mild B knee flexion contracture 5 degrees-able to fully extend w/ over pressure    MMT:    UE's gen 4/5 t/o major muscle groups  LE's gen 4-/5 gluts/hip abd, gastrocs 3/5    Neuromuscular System:  Sensation:   Not assessed today.    Tone:     tremor R UE    Coordination:  Finger opposition reduced on the left  Test for rapid alternating movements of UE reduced L>R  Test for rapid alternating movements of LE equivocal R>L     INITIAL EVAL    Functional Mobility and transfers:    I bed mobility, no increased time/effort on mat table, reports difficulty at home especially when fatigued.   Balance:   sitting balance static/dynamic WNL   MCTSIB:  WNL     Ambulation:   Ambulates w/o AD note trunk stooped F, no trunk rotation, arms held at sides, decreased heel to toe.                Outcomes:   INITIAL EVAL     ABC: Score 78%   RISK FOR FALLS No    Mini Best:  Score 18/25   RISK FOR FALLS No          INTERVENTION:  EVALUATION  OUTCOMES ADMINISTERED: ABC, mini best  THERAPEUTIC EXERCISE: calf stretch, QS/GS in bed  Gait: To focus on swinging UE's while ambulating.    Patient Education:   Patient was educated on goals and benefits of therapy, as well as  The BIG/Loud program which he can do with OT once [name] [name] from PT .    Pt. did demonstrate an understanding of this education.  Home Exercise Program was issued today.Marland Kitchen    EVALUATION AND DIAGNOSIS:   [name] [name] is a 79 y.o. male presents to physical therapy with:    Impairments in body function and structure:  Decreased ROM  Decreased Strength  Impaired Balance  Impaired Gait  Decreased safety/judgement- pt nearly falling each time he had to step over object-did not stop to judge distance, plowed through w/o hesitation.   Impulsivity .      Activity Limitations / Restricted Participation:  Patient is unable to perform bed mobility independently resulting in risk for skin breakdown and higher level of care.  Patient is unable to ambulate safely for community distances for safety with attendance of medical appointments and performance of IADL's.  Patient exhibits impaired posture that affects balance and increases risk for falls.  The following barriers to care may affect patient prognosis:  Low  motivation-pt came to therapy because son forced him.      PROGNOSIS:  Good for goals.    Without skilled intervention, patient may not be able to:  Perform light household chores safely and independently without risk for fall or injury.  Participate in safe and independent ADL's such as bathing, dressing, grooming.  Participate in safe and independent community ambulation for IADL's such as grocery shopping and preparing  meals.    Goals:  Short Term Goals:  To be met by 06-15-19  1. Patient will perform all bed mobility with no increased time/effort  to increase safety and independence.  2. Patient will perform sit to stand first attempt w/o UE assist to increase safety and independence.  3. Pt will perform car transfers w/ correct technique.  4.  Pt will ambulate w/ reciprocal arm swing and demonstrate trunk rotation 50 % of th time.    Long Term Goals:  To be met by 08-03-19    5. Patient will be able to negotiate 16  stairs with 1 rail assist for safety with ADL's / IADL's.  6. Patient will safely and effectively negotiate a curb for safety with community ambulation for attendance of medical appointments and IADL performance.  7. Patient will improve score on the ABC from 78 to 88 and minibest from 18/24 to 22/24 to decrease risk for falls and increase safety with functional mobility.  8. Pt will ambulate w/ reciprocal arm swing and demonstrate trunk rotation 100 % of the time.  9.  Pt will be I comprehensive HEP and carry over on a regular basis.  10.  Pt will demo 10 degrees of ankle df, full knee extension and hams/hip flexors WFL to allow more normal gait.     PLAN:  97530 Therapeutic Activities, to include functional mobility training.  16109 Therapeutic Exercises, to include home exercise program.  [phone] Neuromuscular Re-education, to include balance training.  098119 Gait Training  14782 Manual Therapy  Patient/Caregiver Education and Training    Frequency of treatment:    2 times per week for 12 weeks.    It has been a pleasure to evaluate [name] [name].  Please contact me with any questions or concerns regarding this patient's evaluation or ongoing therapy.     Therapist Signature:    [name] PT [phone]  [facility]  Adult and Pediatric Re[facility]  [address]  [place], [place] [address]  [phone]      05/04/2019      CPT Evaluation Code Justification  CRITERIA JUSTIFICATION DESCRIPTION   Personal  factors and/or co-morbidities that impact the plan of care. Factors that affect plan of care include:  Diabetes Progressive neurological disease Generalized weakness Limited social support 3 (High Complexity)     Body Systems Examined Impairments noted in:  Musculoskeletal  Neuromuscular  Cardiovascular  Integumentary 3 or more elements (Moderate Complexity)     Clinical Presentation Presentation varies throughout session, days. Evolving (Moderate Complexity)     Clinical Decision Making Standardized Assessment used:  See below for details. Evaluation Code:  Moderate Complexity

## 2019-05-09 ENCOUNTER — Ambulatory Visit: Payer: No Typology Code available for payment source

## 2019-05-09 DIAGNOSIS — R2689 Other abnormalities of gait and mobility: Secondary | ICD-10-CM

## 2019-05-09 DIAGNOSIS — R269 Unspecified abnormalities of gait and mobility: Secondary | ICD-10-CM

## 2019-05-09 NOTE — Progress Notes (Signed)
[facility]  [address], [address]  [place], [place]  [address]  Phone:  [phone]  Fax:  [phone]    PHYSICAL THERAPY DAILY TREATMENT NOTE    PATIENT: [name] [name] DOB: 1940-04-12   MR #: [id number]  AGE: 79 y.o.    FACILITY PROVIDER #: [id number] PRIMARY MD: [name], MD    HICN# Medicare Sub. Num: [id number] DIAGNOSES: Parkinson's disease [G20];Unspecified abnormalities of gait and mobility [R26.9];Other abnormalities of gait and mobility [R26.89]      Date of Service PT Received On: 05/09/19   Treatment Time Start Time: 1113 to Stop Time: 1100   Time Calculation Time Calculation (min): 1427 min   Visit #  2/30   Units Billed   Therapeutic Interventions  $ PT Therapeutic Activity (97530): 3 units     [name] referred for physical therapy services by: [name], MD    Education and Infection Prevention Regarding Covid-19 Pandemic:  Patient was educated on National's Policy and Procedure regarding COVID-19 care and safety precautions. Discussed and reviewed patient's medical history and personal risk factors and patient acknowledges exposure risk and wishes to proceed with care.    [id number] Screen: Patient does not present with a temperature >/= 100.0, no new cough, no new throat pain, and no new shortness of breath.     Personal Protective Equipment Utilized:  Procedure Mask    Certification period, precautions, medications and allergies and goals copied from initial evaluation - reviewed and reconciled today.  [name], PT 05/09/2019    CERTIFICATION PERIOD:  05-04-19 to 08-03-19    TREATMENT DIAGNOSIS:  Gait Abnormality R26.9, Abnormal increase muscle tightness M62.89 and Lack of Coordination R27.9     Medications:  has a current medication list which includes the following prescription(s): allopurinol, ascorbic acid, aspirin ec, atorvastatin, accu-chek aviva plus, carbidopa-levodopa, coq10, glucosamine-chondroitin, glucose  blood, accu-chek soft touch, losartan, metformin, multiple vitamins-minerals, nitroglycerin, sertraline, and vitamin c.    Precautions:   HBP / CAD  Diabetic     Allergies:  No Known Allergies  Goals:  Short Term Goals:  To be met by 06-15-19  1. Patient will perform all bed mobility with no increased time/effort  to increase safety and independence.  2. Patient will perform sit to stand first attempt w/o UE assist to increase safety and independence.  3. Pt will perform car transfers w/ correct technique.  4.  Pt will ambulate w/ reciprocal arm swing and demonstrate trunk rotation 50 % of th time.    Long Term Goals:  To be met by 08-03-19    5. Patient will be able to negotiate 16  stairs with 1 rail assist for safety with ADL's / IADL's.  6. Patient will safely and effectively negotiate a curb for safety with community ambulation for attendance of medical appointments and IADL performance.  7. Patient will improve score on the ABC from 78 to 88 and minibest from 18/24 to 22/24 to decrease risk for falls and increase safety with functional mobility.  8. Pt will ambulate w/ reciprocal arm swing and demonstrate trunk rotation 100 % of the time.  9.  Pt will be I comprehensive HEP and carry over on a regular basis.  10.  Pt will demo 10 degrees of ankle df, full knee extension and hams/hip flexors WFL to allow more normal gait.       *Working Toward the Above Goals: (copied from last visit)*  GOAL# Progress Toward Goals   1 Not Addressed Today   2 Progressing   3 Progressing   4 Progressing   5 Not Addressed Today   6 Progressing       EXAMINATION & CLINICAL FINDINGS:  Subjective Report:    Patient with nothing to report.  Pt reports feels N today.  Threw up this morning.  Reports for years whenever he bends over to tie shoes, he gets N and throws up.      Patient presents for physical therapy today with caregiver. [name]-she comes in 3 days/wk for 5 hrs    Observations: Pt arrived late today w/ caregiver, got  lost in parking lot.  Note patient ambulating into clinic w/ arms held at his sides/crossed in front of chest, stooped posture    Pain:    Patient denies pain today.    Outcomes assessed today:   No outcomes assessed today.    INTERVENTION:  Treatment Performed:  -nustep seat 9/arms 11 x10 min w/ emphasis on push/pull/LE only etc to warm up body.  -stair stretches: hip flexor, hams, calf on edge of step, calf raises x15, corner pec stretch  -gait training: facilitated reciprocal arm swing-used walking stick w/ therapist guiding.     Patient Education:   Patient educated on the need to get up and walk every hour, to keep active in body/mind.  TO swing arms when walking.   Pt. did verbalize and demonstrate an understanding of this education   Home Exercise Program was issued today..for stair stretches of LE's, corner pec stretch and calf raises.  See handout for details.     PHYSICAL THERAPY EVALUATION  Response to treatment: Pt had some N during session after doing stair stretches.  Difficulty w/ reciprocal arm swing-UE's move slower than LE's-good w/ therapist assisting for timing. Caregiver present and willing to A pt w/ HEP 3 x's/week    Impairments in body function and structure:  Decreased ROM  Decreased Strength  Impaired Balance  Impaired Gait  Decreased safety/judgement- pt nearly falling each time he had to step over object-did not stop to judge distance, plowed through w/o hesitation.   Impulsivity .      Activity Limitations / Restricted Participation:  Patient is unable to perform bed mobility independently resulting in risk for skin breakdown and higher level of care.  Patient is unable to ambulate safely for community distances for safety with attendance of medical appointments and performance of IADL's.  Patient exhibits impaired posture that affects balance and increases risk for falls.  The following barriers to care may affect patient prognosis:  Low motivation-pt came to therapy because son forced  him.    PLAN:  Patient will require continued skilled intervention to address above stated impairments and activity limitations.    Continue with established plan of care with focus on gait/balance, strengthening of hips/gluts for next visit.    Therapist Signature:    [name] PT [phone]  [facility]  Adult and Pediatric Rehab  [address]  [place], [place] [address]  [phone]    05/09/2019

## 2019-05-11 ENCOUNTER — Ambulatory Visit: Payer: No Typology Code available for payment source

## 2019-05-12 ENCOUNTER — Encounter (INDEPENDENT_AMBULATORY_CARE_PROVIDER_SITE_OTHER): Payer: Self-pay | Admitting: Family Medicine

## 2019-05-12 ENCOUNTER — Ambulatory Visit (INDEPENDENT_AMBULATORY_CARE_PROVIDER_SITE_OTHER): Payer: No Typology Code available for payment source | Admitting: Family Medicine

## 2019-05-12 VITALS — BP 132/66 | HR 72 | Temp 97.3°F | Resp 16 | Wt 169.0 lb

## 2019-05-12 DIAGNOSIS — H01025 Squamous blepharitis left lower eyelid: Secondary | ICD-10-CM

## 2019-05-12 DIAGNOSIS — E1169 Type 2 diabetes mellitus with other specified complication: Secondary | ICD-10-CM

## 2019-05-12 DIAGNOSIS — I209 Angina pectoris, unspecified: Secondary | ICD-10-CM

## 2019-05-12 DIAGNOSIS — H01022 Squamous blepharitis right lower eyelid: Secondary | ICD-10-CM

## 2019-05-12 LAB — POCT GLUCOSE: Whole Blood Glucose POCT: 98 mg/dL (ref 70–100)

## 2019-05-12 MED ORDER — ERYTHROMYCIN 5 MG/GM OP OINT
TOPICAL_OINTMENT | Freq: Three times a day (TID) | OPHTHALMIC | 0 refills | Status: AC
Start: 2019-05-12 — End: 2019-05-19

## 2019-05-12 NOTE — Progress Notes (Signed)
[facility] [facility]                       Date of Exam: 05/12/2019        Patient ID: [name] [name] is a 79 y.o. male.  Attending Physician: [name], MD        Chief Complaint:    Chief Complaint   Patient presents with   . Parkinsons               HPI:    79 year old male here for f/u PD, DM    1. Eating well , stopped glipizide, stopped checking blood sugar RBS today after large breakfast 98  2.  Depression controlled. Declines therapy  3. Eye lid crusting off and on for 3 weeks, burning in right eye            Problem List:    Patient Active Problem List   Diagnosis   . Parkinson's disease   . Diabetes mellitus type 2, controlled   . Mild episode of recurrent major depressive disorder   . Neutrophilic leukocytosis   . Low back pain   . Hypertension   . Hepatic cyst   . Dyslipidemia   . Gouty arthropathy   . Diffuse esophageal spasm   . Diabetic nephropathy associated with type 2 diabetes mellitus   . Depression   . CAD (coronary artery disease)   . Anosmia   . Benign essential hypertension   . Angina pectoris   . Nocturia   . Optic neuritis   . Other and unspecified hyperlipidemia   . Pulmonary nodule, left   . S/P CABG (coronary artery bypass graft)   . Shoulder joint pain   . Tremor   . Ureteric stone             Current Meds:    Current Outpatient Medications on File Prior to Visit   Medication Sig Dispense Refill   . allopurinol (ZYLOPRIM) 300 MG tablet TAKE 1 TABLET (300 MG TOTAL) BY MOUTH DAILY FOR 30 DAYS. NO FURTHER REFILLS WITH OUT OFFICE VISIT 90 tablet 4   . Ascorbic Acid (VITAMIN C PO) Take 1,000 mg by mouth     . aspirin EC 81 MG EC tablet Take by mouth     . atorvastatin (LIPITOR) 20 MG tablet TAKE 1 TABLET BY MOUTH AT BEDTIME, NEEDS OFFICE VISIT FOR MORE REFILLS 90 tablet 4   . carbidopa-levodopa (SINEMET) 25-100 MG per tablet TAKE 5 TABLETS DAILY AS DIRECTED  1.5 EACH AT 8 AM AND 1 PM, AND 1 TABLET EACH AT 6 PM AND 11PM     . Coenzyme Q10  (CoQ10) 200 MG Cap Take 400 mg by mouth     . glucosamine-chondroitin 500-400 MG tablet Take 1 tablet by mouth     . losartan (COZAAR) 25 MG tablet TAKE 1 TABLET BY MOUTH DAILY. MUST MAKE APPOINTMENT FOR ADDITIONAL REFILLS 90 tablet 3   . metFORMIN (GLUCOPHAGE) 1000 MG tablet TAKE 1 TABLET (1,000 MG TOTAL) BY MOUTH 2 TIMES DAILY WITH MEALS. 180 tablet 4   . Multiple Vitamins-Minerals (CENTRUM SILVER 50+MEN PO) Take 1 tablet by mouth     . nitroglycerin (NITROSTAT) 0.4 MG SL tablet Place 0.4 mg under the tongue     . sertraline (ZOLOFT) 25 MG tablet TAKE 1 TABLET (25 MG TOTAL) BY MOUTH ONCE DAILY. 90 tablet 4   . [DISCONTINUED] Blood Glucose  Monitoring Suppl (Accu-Chek Aviva Plus) w/Device Kit Use as Directed 1 kit 1   . [DISCONTINUED] glucose blood test strip Test fasting blood sugar once daily 100 each 1   . [DISCONTINUED] Lancets (accu-chek soft touch) lancets Check fasting blood sugar once daily 100 each 1   . [DISCONTINUED] vitamin C (ASCORBIC ACID) 500 MG tablet Take 500 mg by mouth       No current facility-administered medications on file prior to visit.          Allergies:    No Known Allergies          Past Surgical History:    History reviewed. No pertinent surgical history.        Family History:    History reviewed. No pertinent family history.        Social History:    Social History     Tobacco Use   . Smoking status: Never Smoker   . Smokeless tobacco: Never Used   Substance Use Topics   . Alcohol use: Not on file   . Drug use: Not on file          The following sections were reviewed this encounter by the provider:   Tobacco  Allergies  Meds  Problems  Med Hx  Surg Hx  Fam Hx             Vital Signs:    BP 132/66   Pulse 72   Temp 97.3 F (36.3 C)   Resp 16   Wt 76.7 kg (169 lb)          ROS:    Review of Systems   Constitutional: Negative for activity change and appetite change.   HENT: Negative for ear discharge and ear pain.    Eyes: Negative for discharge.   Respiratory: Negative for  apnea and shortness of breath.    Cardiovascular: Negative for chest pain.   Gastrointestinal: Negative for abdominal distention.   Endocrine: Negative for cold intolerance.   Genitourinary: Negative for difficulty urinating.   Musculoskeletal: Negative for arthralgias.   Neurological: Positive for tremors.              Physical Exam:    Physical Exam  Constitutional:       Appearance: Normal appearance.   HENT:      Head: Normocephalic.      Ears:      Comments: Crusting and swelling of right lower lid with conjunctival injection at the base of lid     Nose: Nose normal.   Eyes:      Extraocular Movements: Extraocular movements intact.   Cardiovascular:      Rate and Rhythm: Normal rate.      Heart sounds: No murmur.   Pulmonary:      Effort: Pulmonary effort is normal.      Breath sounds: Normal breath sounds.   Abdominal:      General: There is no distension.      Palpations: There is no mass.   Musculoskeletal:      Right lower leg: No edema.      Left lower leg: No edema.   Skin:     General: Skin is dry.   Neurological:      Mental Status: He is alert.   Psychiatric:         Mood and Affect: Mood normal.         Thought Content: Thought content normal.  Judgment: Judgment normal.              Assessment:    1. Controlled type 2 diabetes mellitus with other specified complication, without long-term current use of insulin  - POCT Glucose    2. Angina pectoris    3. 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         [name], MD

## 2019-05-16 ENCOUNTER — Encounter (INDEPENDENT_AMBULATORY_CARE_PROVIDER_SITE_OTHER): Payer: Self-pay

## 2019-05-16 ENCOUNTER — Ambulatory Visit: Payer: No Typology Code available for payment source

## 2019-05-16 DIAGNOSIS — R269 Unspecified abnormalities of gait and mobility: Secondary | ICD-10-CM

## 2019-05-16 DIAGNOSIS — R2689 Other abnormalities of gait and mobility: Secondary | ICD-10-CM

## 2019-05-16 NOTE — Progress Notes (Signed)
[facility]  [address], [address]  [place], [place]  [address]  Phone:  [phone]  Fax:  [phone]    PHYSICAL THERAPY DAILY TREATMENT NOTE    PATIENT: [name] [name] DOB: January 10, 1941   MR #: [id number]  AGE: 79 y.o.    FACILITY PROVIDER #: [id number] PRIMARY MD: [name], MD    HICN# Medicare Sub. Num: [id number] DIAGNOSES: Parkinson's disease [G20];Unspecified abnormalities of gait and mobility [R26.9];Other abnormalities of gait and mobility [R26.89]      Date of Service PT Received On: 05/16/19   Treatment Time Start Time: 1106 to Stop Time: 1200   Time Calculation Time Calculation (min): 54 min   Visit #  3/30   Units Billed   Therapeutic Interventions  $ PT Therapeutic Activity (97530): 4 units     [name] referred for physical therapy services by: [name], MD    Education and Infection Prevention Regarding Covid-19 Pandemic:  Patient was educated on [facility]'s Policy and Procedure regarding COVID-19 care and safety precautions. Discussed and reviewed patient's medical history and personal risk factors and patient acknowledges exposure risk and wishes to proceed with care.    [name] Screen: Patient does not present with a temperature >/= 100.0, no new cough, no new throat pain, and no new shortness of breath.     Personal Protective Equipment Utilized:  Procedure Mask    Certification period, precautions, medications and allergies and goals copied from initial evaluation - reviewed and reconciled today.  [name], PT 05/16/2019    CERTIFICATION PERIOD:  05-04-19 to 08-03-19    TREATMENT DIAGNOSIS:  Gait Abnormality R26.9, Abnormal increase muscle tightness M62.89 and Lack of Coordination R27.9     Medications:  has a current medication list which includes the following prescription(s): allopurinol, ascorbic acid, aspirin ec, atorvastatin, accu-chek aviva plus, carbidopa-levodopa, coq10, glucosamine-chondroitin, glucose blood,  accu-chek soft touch, losartan, metformin, multiple vitamins-minerals, nitroglycerin, sertraline, and vitamin c.    Precautions:   HBP / CAD  Diabetic     Allergies:  No Known Allergies  Goals:  Short Term Goals:  To be met by 06-15-19  1. Patient will perform all bed mobility with no increased time/effort  to increase safety and independence.  2. Patient will perform sit to stand first attempt w/o UE assist to increase safety and independence.  3. Pt will perform car transfers w/ correct technique.  4.  Pt will ambulate w/ reciprocal arm swing and demonstrate trunk rotation 50 % of th time.    Long Term Goals:  To be met by 08-03-19    5. Patient will be able to negotiate 16  stairs with 1 rail assist for safety with ADL's / IADL's.  6. Patient will safely and effectively negotiate a curb for safety with community ambulation for attendance of medical appointments and IADL performance.  7. Patient will improve score on the ABC from 78 to 88 and minibest from 18/24 to 22/24 to decrease risk for falls and increase safety with functional mobility.  8. Pt will ambulate w/ reciprocal arm swing and demonstrate trunk rotation 100 % of the time.  9.  Pt will be I comprehensive HEP and carry over on a regular basis.  10.  Pt will demo 10 degrees of ankle df, full knee extension and hams/hip flexors WFL to allow more normal gait.       *Working Toward the Above Goals: (copied from last visit)*  GOAL# Progress Toward Goals   1 Not Addressed Today   2 Progressing   3 Progressing   4 Progressing   5 Not Addressed Today   6 Progressing       EXAMINATION & CLINICAL FINDINGS:  Subjective Report:  Pt reports gets N/V every day for past 2-3 years. This occurs after he eats. He reports eating oatmeal today but usually has cheerios, sandwich for lunch. etc    Patient presents for physical therapy today with caregiver. [name]-she comes in 3 days/wk for 5 hrs    Observations: Note patient ambulating into clinic w/  No arm swing,  stooped posture    Pain:    Patient denies pain today.    Outcomes assessed today:   No outcomes assessed today.    INTERVENTION:  Treatment Performed:  TE:  -nustep seat 9/arms 11 x10 min w/ emphasis on push/pull/LE only etc to warm up body.  -Mat ex: LTR, bridges x10, S/L open book 5 reps w/ HS stretch hold last rep  -seated cervical rotation 3 x R/L  -standing ex: counter top pushupsx15, calf raises x 15 f/b calf stretch, mini squatsx  10 , hip abd B x10    TA:  -stand>floor tx w/ SBA w/o problem,  Practiced lateral scooting in hooklying, rolling to prone, prone<>quadruped, S/L>side sits>quadruped> chair    -gait training: facilitated reciprocal arm swing-used walking stick w/ therapist guiding.     Patient Education:   Patient educated on the need to get up and walk every hour, to keep active in body/mind.  To do HEP everyday-can choose between mat and standing ex.    Pt. did verbalize and demonstrate an understanding of this education   Home Exercise Program was issued today..for stair stretches of LE's, corner pec stretch and calf raises.  See handout for details.     PHYSICAL THERAPY EVALUATION  Response to treatment: Pt had difficulty w/ moving body on mat, UE's got stuck under him- needed A to move UE's out from under him, cues for techniques.  Pt w/ bradykinesia especially of UE's. Weakness in UE's makes it difficult to push up to quadruped from prone.  Caregiver present and attentive-motivated to help client with HEP.    Impairments in body function and structure:  Decreased ROM  Decreased Strength  Impaired Balance  Impaired Gait  Decreased safety/judgement- pt nearly falling each time he had to step over object-did not stop to judge distance, plowed through w/o hesitation.   Impulsivity .      Activity Limitations / Restricted Participation:  Patient is unable to perform bed mobility independently resulting in risk for skin breakdown and higher level of care.  Patient is unable to ambulate safely for  community distances for safety with attendance of medical appointments and performance of IADL's.  Patient exhibits impaired posture that affects balance and increases risk for falls.  The following barriers to care may affect patient prognosis:  Low motivation-pt came to therapy because son forced him.    PLAN:  Patient will require continued skilled intervention to address above stated impairments and activity limitations.    Continue with established plan of care with focus on gait/balance, strengthening of hips/gluts for next visit.    Therapist Signature:    [name] PT [phone]  [facility]  Adult and Pediatric Rehab  [address]  [place], [place] [address]  [phone]    05/16/2019

## 2019-05-18 ENCOUNTER — Ambulatory Visit: Payer: No Typology Code available for payment source

## 2019-05-18 DIAGNOSIS — R269 Unspecified abnormalities of gait and mobility: Secondary | ICD-10-CM

## 2019-05-18 DIAGNOSIS — R2689 Other abnormalities of gait and mobility: Secondary | ICD-10-CM

## 2019-05-18 NOTE — Progress Notes (Signed)
[facility]  [address], [address]  [place], [place]  [address]  Phone:  [phone]  Fax:  [phone]    PHYSICAL THERAPY DAILY TREATMENT NOTE    PATIENT: [name] [name] DOB: 1940/06/29   MR #: [id number]  AGE: 79 y.o.    FACILITY PROVIDER #: [id number] PRIMARY MD: [name] Doom, MD    HICN# Medicare Sub. Num: [id number] DIAGNOSES: Parkinson's disease [G20];Unspecified abnormalities of gait and mobility [R26.9];Other abnormalities of gait and mobility [R26.89]      Date of Service PT Received On: 05/18/19   Treatment Time Start Time: 1105 to Stop Time: 1200   Time Calculation Time Calculation (min): 55 min   Visit # PT Visit  PT Visit Number: 4/30   Units Billed   Therapeutic Interventions  $ PT Therapeutic Activity (97530): 4 units     [name] [name] referred for physical therapy services by: [name] Doom, MD    Education and Infection Prevention Regarding Covid-19 Pandemic:  Patient was educated on [facility]'s Policy and Procedure regarding COVID-19 care and safety precautions. Discussed and reviewed patient's medical history and personal risk factors and patient acknowledges exposure risk and wishes to proceed with care.    [name] [name] Screen: Patient does not present with a temperature >/= 100.0, no new cough, no new throat pain, and no new shortness of breath.     Personal Protective Equipment Utilized:  Procedure Mask    Certification period, precautions, medications and allergies and goals copied from initial evaluation - reviewed and reconciled today.  [name] [name], PT 05/18/2019    CERTIFICATION PERIOD:  05-04-19 to 08-03-19    TREATMENT DIAGNOSIS:  Gait Abnormality R26.9, Abnormal increase muscle tightness M62.89 and Lack of Coordination R27.9     Medications:  has a current medication list which includes the following prescription(s): allopurinol, ascorbic acid, aspirin ec, atorvastatin, accu-chek aviva plus, carbidopa-levodopa, coq10,  glucosamine-chondroitin, glucose blood, accu-chek soft touch, losartan, metformin, multiple vitamins-minerals, nitroglycerin, sertraline, and vitamin c.    Precautions:   HBP / CAD  Diabetic     Allergies:  No Known Allergies  Goals:  Short Term Goals:  To be met by 06-15-19  1. Patient will perform all bed mobility with no increased time/effort  to increase safety and independence.  2. Patient will perform sit to stand first attempt w/o UE assist to increase safety and independence.  3. Pt will perform car transfers w/ correct technique.  4.  Pt will ambulate w/ reciprocal arm swing and demonstrate trunk rotation 50 % of th time.    Long Term Goals:  To be met by 08-03-19    5. Patient will be able to negotiate 16  stairs with 1 rail assist for safety with ADL's / IADL's.  6. Patient will safely and effectively negotiate a curb for safety with community ambulation for attendance of medical appointments and IADL performance.  7. Patient will improve score on the ABC from 78 to 88 and minibest from 18/24 to 22/24 to decrease risk for falls and increase safety with functional mobility.  8. Pt will ambulate w/ reciprocal arm swing and demonstrate trunk rotation 100 % of the time.  9.  Pt will be I comprehensive HEP and carry over on a regular basis.  10.  Pt will demo 10 degrees of ankle df, full knee extension and hams/hip flexors WFL to allow more normal gait.       *Working Toward the Above Goals: (copied  from last visit)*    GOAL# Progress Toward Goals   1 Not Addressed Today   2 Progressing   3 Progressing   4 Progressing   5 Not Addressed Today   6 Progressing       EXAMINATION & CLINICAL FINDINGS:  Subjective Report:  Pt sons has ordered GF foods to see if this help with symproms.  Pt has been exercising w/ [name]-neck stretches, calf stretches, calf raises.  Had oatmeal today no N/V      Patient presents for physical therapy today with caregiver. [name]-she comes in 3 days/wk for 5 hrs    Observations: Note  patient ambulating into clinic w/  No arm swing, stooped posture    Pain:    Patient reports 4-5/10 back pain today- it started yesterday.    Outcomes assessed today:   No outcomes assessed today.    INTERVENTION:  Treatment Performed:    TA:  -stand>floor tx w/ SBA w/o problem,  -Practiced lateral scooting in hooklying R/L w/ cues to move feet/hips/shoulders/head  -hooklying w/ UE's abducted chin nod 10 s hold x10, LTR Bx5, held last rep, B backstroke arms x5 , ktoc, thomas stretch, diag ktoc, B ktoC, bridges x10 5 s hold, ankle across knee piriformis stretch  -short kneel<>tall kneelx5, diagonal reach w/ hands clasped B x5, quadruped alt LE lift x5 B, prone B shoulder retrx10  -prone<>quadruped w/ VC's>quadruped> chair w/ VC's and min A  -gait training: facilitated reciprocal arm swing, stop/start, walking backwards.     Patient Education:   Patient educated on the need to get up and walk every hour, to keep active in body/mind.  To do HEP everyday-can choose between mat and standing ex.    Pt. did verbalize and demonstrate an understanding of this education   Home Exercise Program was issued today..for stair stretches of LE's, corner pec stretch and calf raises.  See handout for details.     PHYSICAL THERAPY EVALUATION  Response to treatment: Pt had difficulty w/ scooting in hooklying, much improved prone> quadruped and up from floor.   Pt w/ bradykinesia with all therex especially of UE's. Cues for UE's in quadruped as the R elbow kept bending.  Pt with poor body awareness.  Caregiver present and attentive-motivated to help client with HEP.    Impairments in body function and structure:  Decreased ROM  Decreased Strength  Impaired Balance  Impaired Gait  Decreased safety/judgement- pt nearly falling each time he had to step over object-did not stop to judge distance, plowed through w/o hesitation.   Impulsivity .      Activity Limitations / Restricted Participation:  Patient is unable to perform bed mobility  independently resulting in risk for skin breakdown and higher level of care.  Patient is unable to ambulate safely for community distances for safety with attendance of medical appointments and performance of IADL's.  Patient exhibits impaired posture that affects balance and increases risk for falls.  The following barriers to care may affect patient prognosis:  Low motivation-pt came to therapy because son forced him.    PLAN:  Patient will require continued skilled intervention to address above stated impairments and activity limitations.    Continue with established plan of care with focus on gait/balance, strengthening of hips/gluts for next visit.    Therapist Signature:    [name] [name] PT [phone]  [facility]  Adult and Pediatric Rehab  [address]  [place], [place] [address]  [phone]  05/18/2019

## 2019-05-23 ENCOUNTER — Ambulatory Visit (INDEPENDENT_AMBULATORY_CARE_PROVIDER_SITE_OTHER): Payer: No Typology Code available for payment source

## 2019-05-23 ENCOUNTER — Ambulatory Visit: Payer: No Typology Code available for payment source

## 2019-05-23 DIAGNOSIS — R269 Unspecified abnormalities of gait and mobility: Secondary | ICD-10-CM

## 2019-05-23 DIAGNOSIS — R2689 Other abnormalities of gait and mobility: Secondary | ICD-10-CM

## 2019-05-23 NOTE — Progress Notes (Signed)
[facility]  [address], [address]  [place], [place]  [address]  Phone:  [phone]  Fax:  [phone]    PHYSICAL THERAPY DAILY TREATMENT NOTE    PATIENT: [name] [name] DOB: [birth year]   MR #: [id number]  AGE: 79 y.o.    FACILITY PROVIDER #: [id number] PRIMARY MD: [name], MD    HICN# Medicare Sub. Num: [id number] DIAGNOSES: Parkinson's disease [G20];Unspecified abnormalities of gait and mobility [R26.9];Other abnormalities of gait and mobility [R26.89]      Date of Service PT Received On: 05/23/19   Treatment Time Start Time: 1104 to Stop Time: 1200   Time Calculation Time Calculation (min): 56 min   Visit #  5/30   Units Billed   Therapeutic Interventions  $ PT Therapeutic Activity (97530): 4 units     [name] referred for physical therapy services by: [name], MD    Education and Infection Prevention Regarding Covid-19 Pandemic:  Patient was educated on [facility]'s Policy and Procedure regarding COVID-19 care and safety precautions. Discussed and reviewed patient's medical history and personal risk factors and patient acknowledges exposure risk and wishes to proceed with care.    OACZY60 Screen: Patient does not present with a temperature >/= 100.0, no new cough, no new throat pain, and no new shortness of breath.     Personal Protective Equipment Utilized:  Procedure Mask    Certification period, precautions, medications and allergies and goals copied from initial evaluation - reviewed and reconciled today.  [name], PT 05/23/2019    CERTIFICATION PERIOD:  05-04-19 to 08-03-19    TREATMENT DIAGNOSIS:  Gait Abnormality R26.9, Abnormal increase muscle tightness M62.89 and Lack of Coordination R27.9     Medications:  has a current medication list which includes the following prescription(s): allopurinol, ascorbic acid, aspirin ec, atorvastatin, accu-chek aviva plus, carbidopa-levodopa, coq10, glucosamine-chondroitin, glucose blood,  accu-chek soft touch, losartan, metformin, multiple vitamins-minerals, nitroglycerin, sertraline, and vitamin c.    Precautions:   HBP / CAD  Diabetic     Allergies:  No Known Allergies  Goals:  Short Term Goals:  To be met by 06-15-19  1. Patient will perform all bed mobility with no increased time/effort  to increase safety and independence.  2. Patient will perform sit to stand first attempt w/o UE assist to increase safety and independence.  3. Pt will perform car transfers w/ correct technique.  4.  Pt will ambulate w/ reciprocal arm swing and demonstrate trunk rotation 50 % of th time.    Long Term Goals:  To be met by 08-03-19    5. Patient will be able to negotiate 16  stairs with 1 rail assist for safety with ADL's / IADL's.  6. Patient will safely and effectively negotiate a curb for safety with community ambulation for attendance of medical appointments and IADL performance.  7. Patient will improve score on the ABC from 78 to 88 and minibest from 18/24 to 22/24 to decrease risk for falls and increase safety with functional mobility.  8. Pt will ambulate w/ reciprocal arm swing and demonstrate trunk rotation 100 % of the time.  9.  Pt will be I comprehensive HEP and carry over on a regular basis.  10.  Pt will demo 10 degrees of ankle df, full knee extension and hams/hip flexors WFL to allow more normal gait.       *Working Toward the Above Goals: (copied from last visit)*  GOAL# Progress Toward Goals   1 Not Addressed Today   2 Progressing   3 Progressing   4 Progressing   5 Not Addressed Today   6 Progressing       EXAMINATION & CLINICAL FINDINGS:  Subjective Report:  Pt has nothing new to report.          Patient presents for physical therapy today with caregiver. [name]-she comes in 3 days/wk for 5 hrs    Observations: Note patient ambulating into clinic w/  No arm swing, stooped posture    Pain:    Patient reports no pain today.    Outcomes assessed today:   No outcomes assessed  today.    INTERVENTION:  Treatment Performed:  TE: corner pec stretch 2 ways, biceps stretch w/ hands clasped behind back, counter top pushups x20, squats 5 s hold x 20, calf raises edge of steps x20 f/b stretch, HS /hip flexor stretch at stairs, B hip abd 2x10, rowing x20, AROM cervical rotation R/L x3 reps to increase ROM  TA:BIG walk - facilitated reciprocal arm swing, stop/start, walking backwards, high side step, front cross overs, braiding L/R, rock and twist to inc trunk rotation/UE swing, alt UE swing at sides and horizontally.      Patient Education:   Patient educated on the need to get up and walk every hour, to intersperse HEP into day.    Pt. did verbalize and demonstrate an understanding of this education   Home Exercise Program was issued today..for squats, hip abd, pushups, rowing.  See handout for details.     PHYSICAL THERAPY EVALUATION  Response to treatment: Some LOB with stop/start of gait.  Pt reverts to ambulalting w/o UE swing after all walking drills.  Does much better with cues.  Pt easily confused w/ braiding-needed inc verbal and demo cues.  Pt unable to help keep track of reps w/ exercises.  Tolerated new therex well.   Caregiver present and attentive-motivated to help client with HEP.    Impairments in body function and structure:  Decreased ROM  Decreased Strength  Impaired Balance  Impaired Gait  Decreased safety/judgement- pt nearly falling each time he had to step over object-did not stop to judge distance, plowed through w/o hesitation.   Impulsivity .      Activity Limitations / Restricted Participation:  Patient is unable to perform bed mobility independently resulting in risk for skin breakdown and higher level of care.  Patient is unable to ambulate safely for community distances for safety with attendance of medical appointments and performance of IADL's.  Patient exhibits impaired posture that affects balance and increases risk for falls.  The following barriers to care may  affect patient prognosis:  Low motivation-pt came to therapy because son forced him.    PLAN:  Patient will require continued skilled intervention to address above stated impairments and activity limitations.    Continue with established plan of care with focus on stepping reactions/lunges, balance.    Therapist Signature:    [name] PT [phone]  [facility]  Adult and Pediatric Rehab  [address]  [place], [place] [address]  [phone]    05/23/2019

## 2019-05-24 ENCOUNTER — Ambulatory Visit (INDEPENDENT_AMBULATORY_CARE_PROVIDER_SITE_OTHER): Payer: No Typology Code available for payment source

## 2019-05-24 DIAGNOSIS — Z23 Encounter for immunization: Secondary | ICD-10-CM

## 2019-05-25 ENCOUNTER — Ambulatory Visit: Payer: No Typology Code available for payment source

## 2019-05-25 DIAGNOSIS — R2689 Other abnormalities of gait and mobility: Secondary | ICD-10-CM

## 2019-05-25 DIAGNOSIS — R269 Unspecified abnormalities of gait and mobility: Secondary | ICD-10-CM

## 2019-05-25 NOTE — Progress Notes (Signed)
[facility]  [address]  [place], [place]  [address]  Phone:  [phone]  Fax:  [phone]    PHYSICAL THERAPY DAILY TREATMENT NOTE    PATIENT: [name] [name] DOB: 12-18-1940   MR #: [id number]  AGE: 79 y.o.    FACILITY PROVIDER #: [id number] PRIMARY MD: [name], MD    HICN# Medicare Sub. Num: [id number] DIAGNOSES: Parkinson's disease [G20];Unspecified abnormalities of gait and mobility [R26.9];Other abnormalities of gait and mobility [R26.89]      Date of Service PT Received On: 05/25/19   Treatment Time Start Time: 1100 to Stop Time: 1200   Time Calculation Time Calculation (min): 60 min   Visit # PT Visit  PT Visit Number: 6 /30   Units Billed   Therapeutic Interventions  $ PT Therapeutic Activity (97530): 4 units     [name] [name] referred for physical therapy services by: [name], MD    Education and Infection Prevention Regarding Covid-19 Pandemic:  Patient was educated on [place]'s Policy and Procedure regarding COVID-19 care and safety precautions. Discussed and reviewed patient's medical history and personal risk factors and patient acknowledges exposure risk and wishes to proceed with care.    [name] Screen: Patient does not present with a temperature >/= 100.0, no new cough, no new throat pain, and no new shortness of breath.     Personal Protective Equipment Utilized:  Procedure Mask    Certification period, precautions, medications and allergies and goals copied from initial evaluation - reviewed and reconciled today.  [name] [name], PT 05/25/2019    CERTIFICATION PERIOD:  05-04-19 to 08-03-19    TREATMENT DIAGNOSIS:  Gait Abnormality R26.9, Abnormal increase muscle tightness M62.89 and Lack of Coordination R27.9     Medications:  has a current medication list which includes the following prescription(s): allopurinol, ascorbic acid, aspirin ec, atorvastatin, accu-chek aviva plus, carbidopa-levodopa, coq10,  glucosamine-chondroitin, glucose blood, accu-chek soft touch, losartan, metformin, multiple vitamins-minerals, nitroglycerin, sertraline, and vitamin c.    Precautions:   HBP / CAD  Diabetic     Allergies:  No Known Allergies  Goals:  Short Term Goals:  To be met by 06-15-19  1. Patient will perform all bed mobility with no increased time/effort  to increase safety and independence.  2. Patient will perform sit to stand first attempt w/o UE assist to increase safety and independence.  3. Pt will perform car transfers w/ correct technique.  4.  Pt will ambulate w/ reciprocal arm swing and demonstrate trunk rotation 50 % of th time.    Long Term Goals:  To be met by 08-03-19    5. Patient will be able to negotiate 16  stairs with 1 rail assist for safety with ADL's / IADL's.  6. Patient will safely and effectively negotiate a curb for safety with community ambulation for attendance of medical appointments and IADL performance.  7. Patient will improve score on the ABC from 78 to 88 and minibest from 18/24 to 22/24 to decrease risk for falls and increase safety with functional mobility.  8. Pt will ambulate w/ reciprocal arm swing and demonstrate trunk rotation 100 % of the time.  9.  Pt will be I comprehensive HEP and carry over on a regular basis.  10.  Pt will demo 10 degrees of ankle df, full knee extension and hams/hip flexors WFL to allow more normal gait.       *Working Toward the Above Goals: (  copied from last visit)*    GOAL# Progress Toward Goals   1 progressing   2 Progressing   3 Progressing   4 Progressing   5 Not Addressed Today   6 Progressing       EXAMINATION & CLINICAL FINDINGS:  Subjective Report:  Pt reports no N/V today, but last night around 9 pm vomited 10 x's.   Caregiver reports pt chokes on water 50% of the time.         Patient presents for physical therapy today with caregiver. [name] [name]-she comes in 3 days/wk for 5 hrs    Observations: Note patient ambulating into clinic w/  No arm swing,  stooped posture    Pain:    Patient reports no pain today upon arrival when getting on LP and at times while on it gets sharp pain in back-thinks its from kidneys-had similar pain w/ kidney stones in the past.    Outcomes assessed today:   No outcomes assessed today.    INTERVENTION:  Treatment Performed:  TE: leg press 75# 2x15, abd crunch on LP x15     TA:BIG walk - facilitated reciprocal arm swing, stop/start, walking backwards, high side step, front cross overs, braiding L/R, march turns R/L  - R/L forward lunge<>upright  -R/L side lunge<>upright  -R/L backward step<>upright      Patient Education:   Patient educated on the need to use a straw to allow the chin to remain tucked when drinking water to prevent aspiration pneumonia.      Pt. did verbalize and demonstrate an understanding of this education   Home Exercise Program was issued today..for squats, hip abd, pushups, rowing.  See handout for details.     PHYSICAL THERAPY EVALUATION  Response to treatment: Note LOB posteriorly when drinking water in standing and pt choking on water.  Note more tremor today in hands/ LE's-difficulty drinking water, spilling water.  Note pt has inc LOB post many times t/o session w/ small stepping reactions or reaching out for walls. Pt seems to mentally check out at times-able to follow instructions for a bit, then gets confused and unable to carry through.  Pt w/ several episodes of yelling out in pain from R mid/low back when scooting over and moving on LP. Palpation reveals tenderness R QL just under T12.       Impairments in body function and structure:  Decreased ROM  Decreased Strength  Impaired Balance  Impaired Gait  Decreased safety/judgement- pt nearly falling each time he had to step over object-did not stop to judge distance, plowed through w/o hesitation.   Impulsivity .      Activity Limitations / Restricted Participation:  Patient is unable to perform bed mobility independently resulting in risk for skin  breakdown and higher level of care.  Patient is unable to ambulate safely for community distances for safety with attendance of medical appointments and performance of IADL's.  Patient exhibits impaired posture that affects balance and increases risk for falls.  The following barriers to care may affect patient prognosis:  Low motivation-pt came to therapy because son forced him.    PLAN:  Patient will require continued skilled intervention to address above stated impairments and activity limitations.    Continue with established plan of care with focus on LB/LE stretches, bed mobility,more UE cross twists, fine motor.  Will contact MD about getting orders for swallow study, speech therapy and OT for when done w/ PT.     Therapist Signature:  [name] [name] PT [phone]  [facility]  Adult and Pediatric Rehab  [address]  [place], [place] [address]  [phone]    05/25/2019

## 2019-05-30 ENCOUNTER — Ambulatory Visit: Payer: No Typology Code available for payment source

## 2019-05-30 ENCOUNTER — Ambulatory Visit: Payer: No Typology Code available for payment source | Attending: Family Medicine

## 2019-05-30 DIAGNOSIS — R279 Unspecified lack of coordination: Secondary | ICD-10-CM | POA: Insufficient documentation

## 2019-05-30 DIAGNOSIS — M6289 Other specified disorders of muscle: Secondary | ICD-10-CM | POA: Insufficient documentation

## 2019-05-30 DIAGNOSIS — G20 Parkinson's disease: Secondary | ICD-10-CM | POA: Insufficient documentation

## 2019-05-30 DIAGNOSIS — R2689 Other abnormalities of gait and mobility: Secondary | ICD-10-CM

## 2019-05-30 DIAGNOSIS — R269 Unspecified abnormalities of gait and mobility: Secondary | ICD-10-CM | POA: Insufficient documentation

## 2019-05-30 NOTE — Progress Notes (Signed)
[facility]  [address], [address]  [place], [place]  [address]  Phone:  [phone]  Fax:  [phone]    PHYSICAL THERAPY DAILY TREATMENT NOTE    PATIENT: [name] [name] DOB: 06/13/1940   MR #: [id number]  AGE: 79 y.o.    FACILITY PROVIDER #: [id number] PRIMARY MD: [name] Doom, MD    HICN# Medicare Sub. Num: [id number] DIAGNOSES: Parkinson's disease [G20];Unspecified abnormalities of gait and mobility [R26.9];Other abnormalities of gait and mobility [R26.89]      Date of Service PT Received On: 05/30/19   Treatment Time Start Time: 1100 to Stop Time: 1200   Time Calculation Time Calculation (min): 60 min   Visit # PT Visit  PT Visit Number: 7 /30   Units Billed   Therapeutic Interventions  $ PT Therapeutic Activity (97530): 4 units     [name] [name] referred for physical therapy services by: [name] Doom, MD    Education and Infection Prevention Regarding Covid-19 Pandemic:  Patient was educated on [name]'s Policy and Procedure regarding COVID-19 care and safety precautions. Discussed and reviewed patient's medical history and personal risk factors and patient acknowledges exposure risk and wishes to proceed with care.    ACZYS06 Screen: Patient does not present with a temperature >/= 100.0, no new cough, no new throat pain, and no new shortness of breath.     Personal Protective Equipment Utilized:  Procedure Mask    Certification period, precautions, medications and allergies and goals copied from initial evaluation - reviewed and reconciled today.  [name] [name], PT 05/30/2019    CERTIFICATION PERIOD:  05-04-19 to 08-03-19    TREATMENT DIAGNOSIS:  Gait Abnormality R26.9, Abnormal increase muscle tightness M62.89 and Lack of Coordination R27.9     Medications:  has a current medication list which includes the following prescription(s): allopurinol, ascorbic acid, aspirin ec, atorvastatin, accu-chek aviva plus, carbidopa-levodopa, coq10,  glucosamine-chondroitin, glucose blood, accu-chek soft touch, losartan, metformin, multiple vitamins-minerals, nitroglycerin, sertraline, and vitamin c.    Precautions:   HBP / CAD  Diabetic     Allergies:  No Known Allergies  Goals:  Short Term Goals:  To be met by 06-15-19  1. Patient will perform all bed mobility with no increased time/effort  to increase safety and independence.  2. Patient will perform sit to stand first attempt w/o UE assist to increase safety and independence.  3. Pt will perform car transfers w/ correct technique.  4.  Pt will ambulate w/ reciprocal arm swing and demonstrate trunk rotation 50 % of th time.    Long Term Goals:  To be met by 08-03-19    5. Patient will be able to negotiate 16  stairs with 1 rail assist for safety with ADL's / IADL's.  6. Patient will safely and effectively negotiate a curb for safety with community ambulation for attendance of medical appointments and IADL performance.  7. Patient will improve score on the ABC from 78 to 88 and minibest from 18/24 to 22/24 to decrease risk for falls and increase safety with functional mobility.  8. Pt will ambulate w/ reciprocal arm swing and demonstrate trunk rotation 100 % of the time.  9.  Pt will be I comprehensive HEP and carry over on a regular basis.  10.  Pt will demo 10 degrees of ankle df, full knee extension and hams/hip flexors WFL to allow more normal gait.       *Working Toward the Above Goals: (  copied from last visit)*    GOAL# Progress Toward Goals   1 progressing   2 Progressing   3 Progressing   4 Progressing   5 Not Addressed Today   6 Progressing       EXAMINATION & CLINICAL FINDINGS:  Subjective Report:  Pt reports reports back hurt all weekend, now it feels better, no pain at start of therapy session. Pt went to dermatologist before PT-had some spots burned off.     Patient presents for physical therapy today with caregiver. [name] [name]-she comes in 3 days/wk for 5 hrs    Observations: Note patient  ambulating into clinic w/  No arm swing, stooped posture    Pain:    Patient     Outcomes assessed today:   No outcomes assessed today.    INTERVENTION:  Treatment Performed:  TA: practiced BIG walking with reciprocal arm swing, backing up w/ slight crouch, switch F/B  -practiced standing alignment after stretches w/ knees fully extended and chin nodded/chest lifted    TE:  nustep warm up seat 11/arms 13 for 10 min wu  Mat ex: ktoc, piriformis w/ diagonal ktoc and ankle across opp knee, LTR, HS w/ strap and therapist/caregiver, backstroke arms>snow angels, chin nod w/ head on 3" yoga block, S/L open book    MT: piriformis/ITB/pec release and gentle NAGS to Tspine/rib springs all to inc thoracic rotation, passive stretch of HS w/ traction; gentle STM w/ biofreeze to L QL -at site of tenderness.       Patient Education: To wear sneakers with ties for safety so we can work on dynamic gait/lunges. Caregivers educated in how to A w/ hamstring stretch, to help pt w/ LB/LE stretches when c/o pain in his back. Other days to work on [employer] production planner ex.   Patient educated on the need to use a straw to allow the chin to remain tucked when drinking water to prevent aspiration pneumonia.      Pt. did verbalize and demonstrate an understanding of this education   Home Exercise Program was issued today..for squats, hip abd, pushups, rowing.  See handout for details.     PHYSICAL THERAPY EVALUATION  Response to treatment: Pt with inc tenderness L QL >R w/ palpation.  No yelling out in pain today as he scooted across mat table.  Tolerated stretches and caregiver seems to have a good understanding of how to help with stretches.  Note at times on mat, pt keeps eyes closed and seems to check out, LE's fall to one side.  Pt needs cues to keep eyes open.  Son given info on what to ask MD for orders for swallow study/SLP/OT/PT for BIG/LOUD program.  Pt not wearing appropriate shoes for therapy today-slip on shoes-pt to wear lace up shoes  from now on.     Impairments in body function and structure:  Decreased ROM  Decreased Strength  Impaired Balance  Impaired Gait  Decreased safety/judgement- pt nearly falling each time he had to step over object-did not stop to judge distance, plowed through w/o hesitation.   Impulsivity .      Activity Limitations / Restricted Participation:  Patient is unable to perform bed mobility independently resulting in risk for skin breakdown and higher level of care.  Patient is unable to ambulate safely for community distances for safety with attendance of medical appointments and performance of IADL's.  Patient exhibits impaired posture that affects balance and increases risk for falls.  The following barriers to care may affect  patient prognosis:  Low motivation-pt came to therapy because son forced him.    PLAN:  Patient will require continued skilled intervention to address above stated impairments and activity limitations.    Continue with established plan of care with focus on dynamic gait/stepping reactions,more UE cross twists, fine motor.  Will contact MD about getting orders for swallow study, speech therapy and OT for when done w/ PT.     Therapist Signature:    [name] [name] PT [phone]  [facility]  Adult and Pediatric Rehab  [address]  [place], [place] [address]  [phone]    05/30/2019

## 2019-06-01 ENCOUNTER — Ambulatory Visit: Payer: No Typology Code available for payment source

## 2019-06-01 DIAGNOSIS — R2689 Other abnormalities of gait and mobility: Secondary | ICD-10-CM

## 2019-06-01 DIAGNOSIS — R269 Unspecified abnormalities of gait and mobility: Secondary | ICD-10-CM

## 2019-06-01 NOTE — Progress Notes (Signed)
[facility]  [address], [address]  [place], [place]  [address]  Phone:  [phone]  Fax:  [phone]    PHYSICAL THERAPY DAILY TREATMENT NOTE    PATIENT: [name] [name] DOB: 1940/05/09   MR #: [id number]  AGE: 79 y.o.    FACILITY PROVIDER #: [id number] PRIMARY MD: [name] Doom, MD    HICN# Medicare Sub. Num: [id number] DIAGNOSES: Parkinson's disease [G20];Unspecified abnormalities of gait and mobility [R26.9];Other abnormalities of gait and mobility [R26.89]      Date of Service PT Received On: 06/01/19   Treatment Time Start Time: 1100 to Stop Time: 1200   Time Calculation Time Calculation (min): 60 min   Visit # PT Visit  PT Visit Number: 8 /30   Units Billed   Therapeutic Interventions  $ PT Therapeutic Activity (97530): 4 units     [name] [name] referred for physical therapy services by: [name] Doom, MD    Education and Infection Prevention Regarding Covid-19 Pandemic:  Patient was educated on [facility] Policy and Procedure regarding COVID-19 care and safety precautions. Discussed and reviewed patient's medical history and personal risk factors and patient acknowledges exposure risk and wishes to proceed with care.    [name] Screen: Patient does not present with a temperature >/= 100.0, no new cough, no new throat pain, and no new shortness of breath.     Personal Protective Equipment Utilized:  Procedure Mask    Certification period, precautions, medications and allergies and goals copied from initial evaluation - reviewed and reconciled today.  [name] [name], PT 06/01/2019    CERTIFICATION PERIOD:  05-04-19 to 08-03-19    TREATMENT DIAGNOSIS:  Gait Abnormality R26.9, Abnormal increase muscle tightness M62.89 and Lack of Coordination R27.9     Medications:  has a current medication list which includes the following prescription(s): allopurinol, ascorbic acid, aspirin ec, atorvastatin, accu-chek aviva plus, carbidopa-levodopa, coq10,  glucosamine-chondroitin, glucose blood, accu-chek soft touch, losartan, metformin, multiple vitamins-minerals, nitroglycerin, sertraline, and vitamin c.    Precautions:   HBP / CAD  Diabetic     Allergies:  No Known Allergies  Goals:  Short Term Goals:  To be met by 06-15-19  1. Patient will perform all bed mobility with no increased time/effort  to increase safety and independence.  2. Patient will perform sit to stand first attempt w/o UE assist to increase safety and independence.  3. Pt will perform car transfers w/ correct technique.  4.  Pt will ambulate w/ reciprocal arm swing and demonstrate trunk rotation 50 % of th time.    Long Term Goals:  To be met by 08-03-19    5. Patient will be able to negotiate 16  stairs with 1 rail assist for safety with ADL's / IADL's.  6. Patient will safely and effectively negotiate a curb for safety with community ambulation for attendance of medical appointments and IADL performance.  7. Patient will improve score on the ABC from 78 to 88 and minibest from 18/24 to 22/24 to decrease risk for falls and increase safety with functional mobility.  8. Pt will ambulate w/ reciprocal arm swing and demonstrate trunk rotation 100 % of the time.  9.  Pt will be I comprehensive HEP and carry over on a regular basis.  10.  Pt will demo 10 degrees of ankle df, full knee extension and hams/hip flexors WFL to allow more normal gait.       *Working Toward the Above Goals: (  copied from last visit)*    GOAL# Progress Toward Goals   1 progressing   2 Progressing   3 Progressing   4 Progressing   5 Not Addressed Today   6 Progressing       EXAMINATION & CLINICAL FINDINGS:  Subjective Report:  Pt reports did some back stretches yesterday. Does exercises regularly w/ [name] [name].  Doing better drinking more water, but not using straw.     Patient presents for physical therapy today with caregiver. [name] [name]-she comes in 3 days/wk for 5 hrs    Observations: Note patient ambulating into clinic w/  No arm  swing, stooped posture    Pain:    Patient denies back pain today    Outcomes assessed today:   No outcomes assessed today.    INTERVENTION:  Treatment Performed:  TA: practiced BIG walking with reciprocal arm swing, backing up w/ slight crouch, switch F/B, stop/start and starting off w/ LLE each time to get arms in synch, high side step R/Lx10, front cross overs R/L x10  -forward lunge/side lunge and back step each w/ hold 10 s w/ scap retractionx5 B slow, x5 fast to facilitate stepping reactions  -rock and twist to increase cervical/trunk rotation and weight shift/pivot of heel  -stand>[name] I w/o chair support  -practiced scooting laterally across mat  -supine>sides>quasruped>stand w/ supervision-no cues or A needed today.   -agility: fast feet out/in    TE:  nustep warm up seat 11/arms 13 for 10 min wu   LTR, chin nod w/pillow x5, bicycle B x5          Patient Education: To wear sneakers with ties for safety so we can work on dynamic gait/lunges. Caregivers educated in how to A w/ hamstring stretch, to help pt w/ LB/LE stretches when c/o pain in his back. Other days to work on [employer] production planner ex.   Patient educated on the need to use a straw to allow the chin to remain tucked when drinking water to prevent aspiration pneumonia.      Pt. did verbalize and demonstrate an understanding of this education   Home Exercise Program was issued today..for squats, hip abd, pushups, rowing.  See handout for details.     PHYSICAL THERAPY EVALUATION  Response to treatment: Great progress w/ [name]/bed mobility today.  Still difficulty scooting laterally across mat- may have been due to end of session and pt fatigued. Some LOB post today-pt grabbing for bars rather than appropriate stepping reactions.  Bradykinesia w/ stepping and attempts at agility.     Impairments in body function and structure:  Decreased ROM  Decreased Strength  Impaired Balance  Impaired Gait  Decreased safety/judgement- pt nearly falling each time he had  to step over object-did not stop to judge distance, plowed through w/o hesitation.   Impulsivity .      Activity Limitations / Restricted Participation:  Patient is unable to perform bed mobility independently resulting in risk for skin breakdown and higher level of care.  Patient is unable to ambulate safely for community distances for safety with attendance of medical appointments and performance of IADL's.  Patient exhibits impaired posture that affects balance and increases risk for falls.  The following barriers to care may affect patient prognosis:  Low motivation-pt came to therapy because son forced him.    PLAN:  Patient will require continued skilled intervention to address above stated impairments and activity limitations.    Continue with established plan of care with focus on dynamic gait/stepping  reactions,more UE cross twists, fine motor.  Will contact MD about getting orders for swallow study, speech therapy and OT for when done w/ PT.     Therapist Signature:    [name] [name] PT [phone]   [facility]  Adult and Pediatric Rehab  [address]  [place], [place] [address]  [phone]    06/01/2019

## 2019-06-06 ENCOUNTER — Ambulatory Visit: Payer: No Typology Code available for payment source

## 2019-06-06 ENCOUNTER — Encounter (INDEPENDENT_AMBULATORY_CARE_PROVIDER_SITE_OTHER): Payer: Self-pay | Admitting: Family Medicine

## 2019-06-06 DIAGNOSIS — R269 Unspecified abnormalities of gait and mobility: Secondary | ICD-10-CM

## 2019-06-06 DIAGNOSIS — R2689 Other abnormalities of gait and mobility: Secondary | ICD-10-CM
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
## 2019-06-08 ENCOUNTER — Ambulatory Visit: Payer: No Typology Code available for payment source

## 2019-06-08 DIAGNOSIS — R2689 Other abnormalities of gait and mobility: Secondary | ICD-10-CM

## 2019-06-08 DIAGNOSIS — R269 Unspecified abnormalities of gait and mobility: Secondary | ICD-10-CM
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
## 2019-06-13 ENCOUNTER — Ambulatory Visit: Payer: No Typology Code available for payment source

## 2019-06-13 DIAGNOSIS — R2689 Other abnormalities of gait and mobility: Secondary | ICD-10-CM

## 2019-06-13 DIAGNOSIS — R269 Unspecified abnormalities of gait and mobility: Secondary | ICD-10-CM
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
## 2019-06-14 ENCOUNTER — Encounter (INDEPENDENT_AMBULATORY_CARE_PROVIDER_SITE_OTHER): Payer: Self-pay | Admitting: Neurology

## 2019-06-14 ENCOUNTER — Telehealth (INDEPENDENT_AMBULATORY_CARE_PROVIDER_SITE_OTHER): Payer: No Typology Code available for payment source | Admitting: Neurology

## 2019-06-14 VITALS — Ht 71.0 in | Wt 168.0 lb

## 2019-06-14 DIAGNOSIS — R1319 Other dysphagia: Secondary | ICD-10-CM

## 2019-06-14 DIAGNOSIS — R110 Nausea: Secondary | ICD-10-CM

## 2019-06-14 DIAGNOSIS — R251 Tremor, unspecified: Secondary | ICD-10-CM

## 2019-06-14 DIAGNOSIS — R1310 Dysphagia, unspecified: Secondary | ICD-10-CM

## 2019-06-14 DIAGNOSIS — G20 Parkinson's disease: Secondary | ICD-10-CM

## 2019-06-14 MED ORDER — RYTARY 36.25-145 MG PO CPCR
2.00 | ORAL_CAPSULE | Freq: Four times a day (QID) | ORAL | 3 refills | Status: DC
Start: 2019-06-14 — End: 2019-11-01

## 2019-06-14 NOTE — Progress Notes (Signed)
Subjective:     TELEHEALTH VISIT (secondary to COVID-19 precautionary measures)    Verbal consent has been obtained from the patient to conduct a telehealth visit encounter to minimize exposure to COVID-19: yes    -------------------------------    79 y/o R handed M with hx HTN, HLD, DM, presents to Movement Disorders clinic for evaluation of Parkinson's disease and related symptoms.     Here to transfer PD care.  Symptoms began around 3 years ago with R handed tremor, mainly at rest, worsened with activity. Walking changed too, slower to get started and unsteady. Had fallen a 'couple of times.' Voice was changing, handwriting was changing "very small." He reports not having noticed a change to sense of smell, reports 2-3 days between bowel movements.  Sleep is fragmented, having to wake for the bathroom then difficulty going back to sleep. No RBD-like symptoms reported.     Was diagnosed with Parkinson's disease around 3 years ago and started on carbidopa/levodopa 1 tablet 3x daily, and noted a limited response. Then moved to 4x daily and then 5x daily 5 hours apart, fits this in between 7am and 1am when he goes to bed. This 'calms them down' but doesn't go away. Helps with his speech as well. Still significantly impaired. At one point, was supposed to try 1.5 tablets but never tried it. Can occasionally feel LH/D, but not right now. No hallucinations but can see 'shadows of movement' at times. At one point, BP was running low but now ok on lower doses of BP meds.     + grandfather with PD. No family history of Parkinson's or tremor, no history of head injury or LOC, no history of chemical or neuroleptic exposure.  Surgical and social history reviewed. Denies heavy drinking, smoking, and drug use. Counseled to abstain from smoking. No contributory family history.      The following portions of the patient's history were reviewed and updated as appropriate: allergies, current medications, past family history,  past social history, past surgical history and problem list.    Review of Systems  + tremor, gait disorder  No recent illness. Denies fever, chills, cough, sinus pain, eye pain, eye redness, ear pain, rhinorrhea, sore throat, chest pain, SOB, wheezing, abd pain, Nausea, Vomiting, diarrhea, constipation, dysuria, or rashes.  All other systems reviewed and are negative except as previously noted in the HPI.    Objective:     Constitutional: NAD   Eyes: Clear conjunctiva  Musculoskeletal: stooped posture, normal muscle bulk.  Integumentary: No abnormal rash noted  Psych: Flat affect, decreased blink rate. Mouth agape.     Neurological:   Language: Spontaneous & fluent speech, good comprehension. Hypophonic.    Cranial Nerves:  III, IV, VI: Mild limitation to vertical gaze, No nystagmus, no palsies, no ptosis.  V: Intact to LT V1-V3 distribution bilaterally.   VI: Symmetrical face and expression.   VIII: Hearing intact to finger rub bilaterally.     Motor Exam: No motor deficits identified.      Sensation: Sensation reported intact to LT grossly through symmetrically.     Cerebellar:   Finger to Nose: intact bilaterally with mild terminal dysmetria on FTN.    Reflexes: Deferred due to ZOOM telehealth visit.     Station/ Gait:Deferred due to location.     Unable to assess tone. Severe resting and positional tremor on R, mild on L. Worsens with delay.  Fine motor use of hands severely impacted R>L, sequencing effect noted. No asterixis,  myoclonus or dyskinesia.     Assessment:     79 y/o R handed M with hx HTN, HLD, DM, presents to Movement Disorders clinic for evaluation of Parkinson's disease and related symptoms.  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  Of note, he appears underdosed in clinic today even at the peak dose of his dosing interval.  Additionally, he describes significant constipation which is further impeding the effect of his  classic carbidopa levodopa.  While he does feel carbidopa levodopa effect, never reaches a point of symptom control and the dose does not seem to cover the 5-hour interval in which he is taking it.  Additionally, he is having overnight wearing off which is leading to sleeplessness and affecting his energy level the next day.    Eval and treat for balance/gait dysfunction related to Parkinson's Disease.  BIG therapy would be preferred.   Of medical necessity to maintain and hopefully improve quality of life.    He has only been on classic carbidopa levodopa, meaning we have a host of more modern medications to try to improve this.  Given that the foundation of therapy being carbidopa/levodopa, we will begin by trying to smooth this out by trying longer lasting and smoother Rytary at a relatively higher dose given his current underdosed state.  The dose will be 2 capsules 145 mg 4 times daily 5 hours apart.  Can always adjust the dosing interval or the dose itself depending on tolerability and response.  Once we stabilize him on longer-lasting levodopa, we can always augment with one of the many once daily agents we have to help treat Parkinson's disease.    Discussed also the role of constipation in medication effect, as well as methods to improve.  Will also send to BIG and LOUD PT/OT and SLT.     He also describes gastroenterological issues including nausea and vomiting at times and choking with some liquids.  While this could be related to the pulsatile effect of classic carbidopa levodopa, need to rule out a primary GI issue such as reflux/hiatal hernia/etc. - will send for modified barium swallow and he will seek a consultation with a GI doctor.    Plan:     As above.     RTC in 4-6 weeks. Patient can follow up sooner if needed. In the meantime, patient will contact the office with any questions or concerns.     -----------------------------------------------------------    Approximately 60 minutes were spent on  the day of service including face-to-face time with patient, coordinating care, record review and documentation.    Ailish Prospero "Johnston Ebbs, MD  Co-Director, Movement Disorders Specialist  [facility]  [facility]    [address] [place]  [address]  [place] [address]    [place], [place] [address]  T [phone]  F [phone]   T [phone]  F [phone]     [email or web address]

## 2019-06-14 NOTE — Patient Instructions (Addendum)
[email or web address]    Our plan:     1) Stop your current carbidopa/levodopa and begin Rytary at a dose of 2 capsules of 145mg  4x daily, 5 hours apart.  If there is any co-pay, don't pay it and let me know.    2) Begin BIG and LOUD PT and speech therapy.     3) Schedule and complete a modified barium swallow.     4) Schedule an evaluation with a GI doctor.    Keep me updated and we'll f/u in 6-8 weeks.     Today's Visit:      In today's visit I reviewed your medications and records relating your health - prior testing, blood work, reports of other health care providers present in your electronic medical record.     If you have records from non-[place] doctors please send to [place] or bring to next office visit.     A copy of today's visit will be sent to your referring doctor and/or primary care doctor.    Let me know if there are things we could have done better for your office visit.    Patient satisfaction survey:      If you receive a patient satisfaction survey, I would greatly appreciate it if you would complete it.     We are like a car dealer where we aim for a score of 9 or 10.  If you had an experience that was less than a 9 or 10, please let me know so we can improve. If you had a good experience today, please let us know too.  We value your feedback.     Contact me online:      Patient Portal online - Please sign up for MyChart -- this is the best way to communicate me.  There is a messaging feature which can send messages directly to my inbox.  It is the best way to communicate with me and get test results, medication refills or ask questions.     You can expect to get a response within 24-48 hours during weekdays - if you do not, resend your request and inform us we did not respond. My goal is for every question answered as soon as possible. Average response for a phone call is 3 days due to the volume we receive (which is why MyChart is preferred).    MyChart should not be used  if you are having a medical emergency -- call 911 in that case.     Coupons for medication:      If you would like to see if samples or vouchers available for your medicines please consider checking online.  Most medicines have web sites where you can print coupons or vouchers for you co-pay.     For example: AutomobileBuzz.is,  DSLRemote.se, Namendaxr.com, http://www.walker-hill.info/, Rytary.com, Vimpat.com    Thank you for trusting me with your health.      Take care,      Alyssha Housh "Johnston Ebbs, MD  Co-Director, Movement Disorders Specialist  [facility]  [facility]    [email or web address]      [address]  [address]  [place] [address]    [place], [place] [address]  T [phone]  F [phone]   T [phone]  F [phone]

## 2019-06-15 ENCOUNTER — Ambulatory Visit: Payer: No Typology Code available for payment source

## 2019-06-15 ENCOUNTER — Encounter (INDEPENDENT_AMBULATORY_CARE_PROVIDER_SITE_OTHER): Payer: Self-pay | Admitting: Neurology

## 2019-06-15 DIAGNOSIS — R269 Unspecified abnormalities of gait and mobility: Secondary | ICD-10-CM

## 2019-06-15 NOTE — Progress Notes (Signed)
[facility]  [address], [address]  [place], [place]  [address]  Phone:  [phone]  Fax:  [phone]    PHYSICAL THERAPY DAILY TREATMENT NOTE    PATIENT: [name] [name] DOB: October 21, 1940   MR #: [id number]  AGE: 79 y.o.    FACILITY PROVIDER #: [id number] PRIMARY MD: [name] Doom, MD    HICN# Medicare Sub. Num: [id number] DIAGNOSES: Parkinson's disease [G20];Unspecified abnormalities of gait and mobility [R26.9];Other abnormalities of gait and mobility [R26.89]      Date of Service PT Received On: 06/15/19   Treatment Time Start Time: 1100 to Stop Time: 1200   Time Calculation Time Calculation (min): 60 min   Visit # PT Visit  PT Visit Number: 12 /30   Units Billed   Therapeutic Interventions  $ PT Therapeutic Activity (97530): 4 units     [name] [name] referred for physical therapy services by: [name] Doom, MD    Education and Infection Prevention Regarding Covid-19 Pandemic:  Patient was educated on [name] [name] and Procedure regarding COVID-19 care and safety precautions. Discussed and reviewed patient's medical history and personal risk factors and patient acknowledges exposure risk and wishes to proceed with care.    [name] Screen: Patient does not present with a temperature >/= 100.0, no new cough, no new throat pain, and no new shortness of breath.     Personal Protective Equipment Utilized:  Procedure Mask    Certification period, precautions, medications and allergies and goals copied from initial evaluation - reviewed and reconciled today.  [name] [name], PT 06/15/2019    CERTIFICATION PERIOD:  05-04-19 to 08-03-19    TREATMENT DIAGNOSIS:  Gait Abnormality R26.9, Abnormal increase muscle tightness M62.89 and Lack of Coordination R27.9     Medications:  has a current medication list which includes the following prescription(s): allopurinol, ascorbic acid, aspirin ec, atorvastatin, accu-chek aviva plus, carbidopa-levodopa, coq10,  glucosamine-chondroitin, glucose blood, accu-chek soft touch, losartan, metformin, multiple vitamins-minerals, nitroglycerin, sertraline, and vitamin c.    Precautions:   HBP / CAD  Diabetic     Allergies:  No Known Allergies    Goals:  Short Term Goals:  To be met by 06-15-19  1. Patient will perform all bed mobility with no increased time/effort  to increase safety and independence.  2. Patient will perform sit to stand first attempt w/o UE assist to increase safety and independence.  3. Pt will perform car transfers w/ correct technique.  4.  Pt will ambulate w/ reciprocal arm swing and demonstrate trunk rotation 50 % of the time.    Long Term Goals:  To be met by 08-03-19    5. Patient will be able to negotiate 16  stairs with 1 rail assist for safety with ADL's / IADL's.  6. Patient will safely and effectively negotiate a curb for safety with community ambulation for attendance of medical appointments and IADL performance.  7. Patient will improve score on the ABC from 78 to 88 and minibest from 18/24 to 22/24 to decrease risk for falls and increase safety with functional mobility.  8. Pt will ambulate w/ reciprocal arm swing and demonstrate trunk rotation 100 % of the time.  9.  Pt will be I comprehensive HEP and carry over on a regular basis.  10.  Pt will demo 10 degrees of ankle df, full knee extension and hams/hip flexors WFL to allow more normal gait.       *Working Toward the  Above Goals: (copied from last visit)*    GOAL# Progress Toward Goals   1 progressing   2 met   3 met   4 Progressing   5 met   6 Progressing       EXAMINATION & CLINICAL FINDINGS:  Subjective Report: saw new neurologist yesterday.  Got orders for swallow study and BIG/LOUD program.  MD thinks N/V may be from meds.        Patient presents for physical therapy today with caregiver. [name] [name]-she comes in 3 days/wk for 5 hrs    Observations: Note patient ambulating into clinic w/  No arm swing, stooped posture    Pain:    Patient  denies back pain today    Outcomes assessed today:   No outcomes assessed today.    INTERVENTION:  Treatment Performed:  TA:     -Seated BIG ex: floor to ceilingx5, seated side to side x 4 each  -2 BIG lunges F/S x5 w/ 8 s hold w/ finger flicks or opp and loud counting  -back stepping reaction w/ 8 count hold    -forward lunge/side lunge and back step 5 x's each fast to facilitate stepping reactions  Rock and reach LE's, UE's only, then together  Rock and twist w/ arms reaching across, practiced weight shift and pivoting heel first.    -practiced BIG walking w/ more erect posture with reciprocal arm swing    Patient Education: [name] [name] present and practiced BIG ex w/ patient so she can A at home. Educated in BIG program being 4x/wk w/ high intensity.   Pt. did verbalize and demonstrate an understanding of this education   Home Exercise Program was issued today..for LAQ.  See handout for details.     PHYSICAL THERAPY EVALUATION  Response to treatment:Pt needs inc cues for bradykinesia/counting/fomr w/ BIG ex.  Caregiver seems to have a good understanding of cues.  Pt w/ N occasional rest breaks needed to prevent vomiting. Note extremely tight hamstrings-unable to fully extend knee w/ LAQ.  Note weakness quads-poor ecc control into chair-collapses.     Pt tolerating BIG program ex well, bradykinesia makes it difficult.  Caregiver present and able to A him at home.        Impairments in body function and structure:  Decreased ROM  Decreased Strength  Impaired Balance  Impaired Gait  Decreased safety/judgement- pt nearly falling each time he had to step over object-did not stop to judge distance, plowed through w/o hesitation.   Impulsivity .      Activity Limitations / Restricted Participation:  Patient is unable to perform bed mobility independently resulting in risk for skin breakdown and higher level of care.  Patient is unable to ambulate safely for community distances for safety with attendance of medical  appointments and performance of IADL's.  Patient exhibits impaired posture that affects balance and increases risk for falls.  The following barriers to care may affect patient prognosis:  Low motivation-pt came to therapy because son forced him.    PLAN:  Patient will require continued skilled intervention to address above stated impairments and activity limitations.    Continue with established plan of care with focus on BIG ex, bed mobility speed, dynamic gait/stepping reactions,more UE cross twists, fine motor.       Therapist Signature:    [name] [name] PT [phone]  [facility]  Adult and Pediatric Rehab  [address]  [place], [place] [address]  [phone]    06/15/2019

## 2019-06-20 ENCOUNTER — Ambulatory Visit: Payer: No Typology Code available for payment source

## 2019-06-20 DIAGNOSIS — R269 Unspecified abnormalities of gait and mobility: Secondary | ICD-10-CM

## 2019-06-20 DIAGNOSIS — R2689 Other abnormalities of gait and mobility: Secondary | ICD-10-CM

## 2019-06-20 NOTE — Progress Notes (Signed)
[facility]  [address], [address]  [place], [place]  [address]  Phone:  [phone]  Fax:  [phone]    PHYSICAL THERAPY DAILY TREATMENT NOTE    PATIENT: [name] [name] DOB: Jun 22, 1940   MR #: [id number]  AGE: 79 y.o.    FACILITY PROVIDER #: [id number] PRIMARY MD: [name] Doom, MD    HICN# Medicare Sub. Num: [id number] DIAGNOSES: Parkinson's disease [G20];Unspecified abnormalities of gait and mobility [R26.9];Other abnormalities of gait and mobility [R26.89]      Date of Service PT Received On: 06/20/19   Treatment Time Start Time: 1100 to Stop Time: 1200   Time Calculation Time Calculation (min): 60 min   Visit # PT Visit  PT Visit Number: 13 /30   Units Billed   Therapeutic Interventions  $ PT Therapeutic Activity (97530): 4 units     [name] referred for physical therapy services by: [name] Doom, MD    Education and Infection Prevention Regarding Covid-19 Pandemic:  Patient was educated on [facility]'s Policy and Procedure regarding COVID-19 care and safety precautions. Discussed and reviewed patient's medical history and personal risk factors and patient acknowledges exposure risk and wishes to proceed with care.    [name] Screen: Patient does not present with a temperature >/= 100.0, no new cough, no new throat pain, and no new shortness of breath.     Personal Protective Equipment Utilized:  Procedure Mask    Certification period, precautions, medications and allergies and goals copied from initial evaluation - reviewed and reconciled today.  [name], PT 06/20/2019    CERTIFICATION PERIOD:  05-04-19 to 08-03-19    TREATMENT DIAGNOSIS:  Gait Abnormality R26.9, Abnormal increase muscle tightness M62.89 and Lack of Coordination R27.9     Medications:  has a current medication list which includes the following prescription(s): allopurinol, ascorbic acid, aspirin ec, atorvastatin, accu-chek aviva plus, carbidopa-levodopa, coq10,  glucosamine-chondroitin, glucose blood, accu-chek soft touch, losartan, metformin, multiple vitamins-minerals, nitroglycerin, sertraline, and vitamin c.    Precautions:   HBP / CAD  Diabetic     Allergies:  No Known Allergies    Goals:  Short Term Goals:  To be met by 06-15-19  1. Patient will perform all bed mobility with no increased time/effort  to increase safety and independence.  2. Patient will perform sit to stand first attempt w/o UE assist to increase safety and independence.  3. Pt will perform car transfers w/ correct technique.  4.  Pt will ambulate w/ reciprocal arm swing and demonstrate trunk rotation 50 % of the time.    Long Term Goals:  To be met by 08-03-19    5. Patient will be able to negotiate 16  stairs with 1 rail assist for safety with ADL's / IADL's.  6. Patient will safely and effectively negotiate a curb for safety with community ambulation for attendance of medical appointments and IADL performance.  7. Patient will improve score on the ABC from 78 to 88 and minibest from 18/24 to 22/24 to decrease risk for falls and increase safety with functional mobility.  8. Pt will ambulate w/ reciprocal arm swing and demonstrate trunk rotation 100 % of the time.  9.  Pt will be I comprehensive HEP and carry over on a regular basis.  10.  Pt will demo 10 degrees of ankle df, full knee extension and hams/hip flexors WFL to allow more normal gait.       *Working Toward the  Above Goals: (copied from last visit)*    GOAL# Progress Toward Goals   1 progressing   2 met   3 met   4 Progressing   5 met   6 Progressing       EXAMINATION & CLINICAL FINDINGS:  Subjective Report: Pt reports was tired after last session when did the entire BIG program.  Pt admits to not doing any exercises on the days that [place] isn't there.      Patient presents for physical therapy today with caregiver. [name]-she comes in 3 days/wk for 5 hrs    Observations: Note patient ambulating into clinic w/ minimal arm swing, stooped  posture    Pain:    Patient denies back pain today    Outcomes assessed today:   No outcomes assessed today.    INTERVENTION:  Treatment Performed:  TA:   -BIG walking, high knee march, high kick w/ reciprocal arms swing; high side step R/>, braiding L/R  -march turns, box step  -resisted walking agitans cord F/R/L facilitating stepping reactions    -supine on 2 yoga blocks for postural correction w/ 2 blocks under head, 1 under scap: pec stretch: T/W, backward arm circles, chin nod w/ 10 s hold x10 reps  -S/L open book x5 B  -practiced fast speed supine<>sit transfers  -corner pec stretch  -BUE arm swing, cross/uncross swing, hands clasped behind back to stretch pecs    Rock and reach [employer], UE's only, then [employer] and twist w/ arms reaching across, practiced weight shift and pivoting heel first.    -practiced BIG walking w/ more erect posture with reciprocal arm swing    Patient Education: [name] present and practiced BIG ex w/ patient so she can A at home. Educated in BIG program being 4x/wk w/ high intensity.   Pt. did verbalize and demonstrate an understanding of this education   Home Exercise Program was issued today..for LAQ.  See handout for details.     PHYSICAL THERAPY EVALUATION  Response to treatment: Note pt demonstrated minimal arm swing today from waiting room>clinic-much improved-not holding UE"s in front of him.  Still moderately stooped posture.    Pt needs inc cues for bradykinesia w/ bed mobility (bett w/ practice)and rock/reach exercise. Some LOB w/ rock and reach ex.  Poor stepping reactions w/ recovery when eccentrically stepping w/ resistance.    Pt tolerating BIG program ex well, bradykinesia makes it difficult.  Caregiver present and able to A him at home.        Impairments in body function and structure:  Decreased ROM  Decreased Strength  Impaired Balance  Impaired Gait  Decreased safety/judgement- pt nearly falling each time he had to step over object-did not stop to judge  distance, plowed through w/o hesitation.   Impulsivity .      Activity Limitations / Restricted Participation:  Patient is unable to perform bed mobility independently resulting in risk for skin breakdown and higher level of care.  Patient is unable to ambulate safely for community distances for safety with attendance of medical appointments and performance of IADL's.  Patient exhibits impaired posture that affects balance and increases risk for falls.  The following barriers to care may affect patient prognosis:  Low motivation-pt came to therapy because son forced him.    PLAN:  Patient will require continued skilled intervention to address above stated impairments and activity limitations.    Continue with established plan of care with focus on BIG ex,block work to  open thoracic spine/rib cage, dynamic gait/stepping reactions,more UE cross twists, fine motor.       Therapist Signature:    [name] PT [phone]  [facility]  Adult and Pediatric Rehab  [address] [place]  [place], [place] [address]  [phone]    06/20/2019

## 2019-06-22 ENCOUNTER — Ambulatory Visit: Payer: No Typology Code available for payment source

## 2019-06-22 DIAGNOSIS — R2689 Other abnormalities of gait and mobility: Secondary | ICD-10-CM

## 2019-06-22 DIAGNOSIS — R269 Unspecified abnormalities of gait and mobility: Secondary | ICD-10-CM

## 2019-06-22 NOTE — Progress Notes (Addendum)
[facility]  [address]  [place], [place]  [address]  Phone:  [phone]  Fax:  [phone]    PHYSICAL THERAPY DAILY TREATMENT NOTE    PATIENT: [name] [name] DOB: 1940-12-21   MR #: [id number]  AGE: 79 y.o.    FACILITY PROVIDER #: [id number] PRIMARY MD: [name], MD    HICN# Medicare Sub. Num: [id number] DIAGNOSES: Parkinson's disease [G20];Unspecified abnormalities of gait and mobility [R26.9];Other abnormalities of gait and mobility [R26.89]      Date of Service PT Received On: 06/22/19   Treatment Time Start Time: 1104 to Stop Time: 1200   Time Calculation Time Calculation (min): 56 min   Visit # PT Visit  PT Visit Number: 14 /30   Units Billed   Therapeutic Interventions  $ PT Therapeutic Activity (97530): 4 units     [name] referred for physical therapy services by: [name], MD    Education and Infection Prevention Regarding Covid-19 Pandemic:  Patient was educated on [facility]'s Policy and Procedure regarding COVID-19 care and safety precautions. Discussed and reviewed patient's medical history and personal risk factors and patient acknowledges exposure risk and wishes to proceed with care.    [name] Screen: Patient does not present with a temperature >/= 100.0, no new cough, no new throat pain, and no new shortness of breath.     Personal Protective Equipment Utilized:  Procedure Mask    Certification period, precautions, medications and allergies and goals copied from initial evaluation - reviewed and reconciled today.  [name], PT 06/22/2019    CERTIFICATION PERIOD:  05-04-19 to 08-03-19    TREATMENT DIAGNOSIS:  Gait Abnormality R26.9, Abnormal increase muscle tightness M62.89 and Lack of Coordination R27.9     Medications:  has a current medication list which includes the following prescription(s): allopurinol, ascorbic acid, aspirin ec, atorvastatin, accu-chek aviva plus, carbidopa-levodopa, coq10,  glucosamine-chondroitin, glucose blood, accu-chek soft touch, losartan, metformin, multiple vitamins-minerals, nitroglycerin, sertraline, and vitamin c.    Precautions:   HBP / CAD  Diabetic     Allergies:  No Known Allergies    Goals:  Short Term Goals:  To be met by 06-15-19  1. Patient will perform all bed mobility with no increased time/effort  to increase safety and independence.  2. Patient will perform sit to stand first attempt w/o UE assist to increase safety and independence.  3. Pt will perform car transfers w/ correct technique.  4.  Pt will ambulate w/ reciprocal arm swing and demonstrate trunk rotation 50 % of the time.    Long Term Goals:  To be met by 08-03-19    5. Patient will be able to negotiate 16  stairs with 1 rail assist for safety with ADL's / IADL's.  6. Patient will safely and effectively negotiate a curb for safety with community ambulation for attendance of medical appointments and IADL performance.  7. Patient will improve score on the ABC from 78 to 88 and minibest from 18/24 to 22/24 to decrease risk for falls and increase safety with functional mobility.  8. Pt will ambulate w/ reciprocal arm swing and demonstrate trunk rotation 100 % of the time.  9.  Pt will be I comprehensive HEP and carry over on a regular basis.  10.  Pt will demo 10 degrees of ankle df, full knee extension and hams/hip flexors WFL to allow more normal gait.       *Working Toward the  Above Goals: (copied from last visit)*    GOAL# Progress Toward Goals   1 progressing   2 met   3 met   4 Progressing   5 met   6 Progressing       EXAMINATION & CLINICAL FINDINGS:  Subjective Report: Pt reports slipped on floor when coming off of steps(wet floor) fell back  Onto stairs.  Doesn't seem injured, no reports of pain.  Still gets N/V at times.  Vomited last night while sleeping.     Patient presents for physical therapy today with caregiver. [name]-she comes in 3 days/wk for 5 hrs    Observations: Note patient  ambulating into clinic w/ minimal arm swing, stooped posture    Pain:    Patient denies back pain today    Outcomes assessed today:   No outcomes assessed today.    INTERVENTION:  147/68 83  Treatment Performed:  TA:   Seated BIG ex: floor to ceilingx5, seated side to side x 4 each  -2 BIG lunges F/S x5 w/ 8 s hold w/ finger flicks or opp and loud counting  -back stepping reaction w/ 8 count hold    -forward lunge/side lunge and back step 5 x's each fast to facilitate stepping reactions  Rock and reach [employer], UE's only, then together  [employer] and twist w/ arms reaching across, practiced weight shift and pivoting heel first.    -practiced BIG walking w/ more erect posture with reciprocal arm swing    -chair pushups x5, LAQ w/ 10 s hold x5    Patient Education: [name] present and practiced BIG ex w/ patient so she can A at home. Educated in BIG program being 4x/wk w/ high intensity.   Pt. did verbalize and demonstrate an understanding of this education   Home Exercise Program was issued today..for LAQ.  See handout for details.     PHYSICAL THERAPY EVALUATION  Response to treatment: Note decreased eccentric control of quads when sitting in chair.  Bradykinesia and brain fog progressively worsen t/o session-only did 4-5 reps of each BIG ex. .  Needs inc cues for all activities.  2 times LOB w/o corrective stepping-needed max A to regain.  Caregiver aware of need to guard pt w/ BIG program and any standing exercises.  Once pt and caregiver  proficient at BIG progam and able to do on their own, pt would benefit from OT for more UE/fine motor activities, and SLP for swallow study and dysphasia.       Impairments in body function and structure:  Decreased ROM  Decreased Strength  Impaired Balance  Impaired Gait  Decreased safety/judgement- pt nearly falling each time he had to step over object-did not stop to judge distance, plowed through w/o hesitation.   Impulsivity .      Activity Limitations / Restricted  Participation:  Patient is unable to perform bed mobility independently resulting in risk for skin breakdown and higher level of care.  Patient is unable to ambulate safely for community distances for safety with attendance of medical appointments and performance of IADL's.  Patient exhibits impaired posture that affects balance and increases risk for falls.  The following barriers to care may affect patient prognosis:  Low motivation-pt came to therapy because son forced him.    PLAN:  Patient will require continued skilled intervention to address above stated impairments and activity limitations.    Continue with established plan of care with focus on BIG ex,block work to open thoracic spine/rib cage,  dynamic gait/stepping reactions,more UE cross twists, fine motor.       Therapist Signature:    [name] PT [phone]  [facility]  Adult and Pediatric Rehab  [address]  [place], [place] [address]  [phone]    06/22/2019

## 2019-06-27 ENCOUNTER — Ambulatory Visit: Payer: No Typology Code available for payment source

## 2019-06-29 ENCOUNTER — Ambulatory Visit: Payer: No Typology Code available for payment source

## 2019-07-03 ENCOUNTER — Other Ambulatory Visit (INDEPENDENT_AMBULATORY_CARE_PROVIDER_SITE_OTHER): Payer: Self-pay

## 2019-07-03 ENCOUNTER — Encounter (INDEPENDENT_AMBULATORY_CARE_PROVIDER_SITE_OTHER): Payer: Self-pay | Admitting: Family Medicine

## 2019-07-03 DIAGNOSIS — I10 Essential (primary) hypertension: Secondary | ICD-10-CM

## 2019-07-03 DIAGNOSIS — M109 Gout, unspecified: Secondary | ICD-10-CM

## 2019-07-03 DIAGNOSIS — E119 Type 2 diabetes mellitus without complications: Secondary | ICD-10-CM

## 2019-07-03 DIAGNOSIS — E782 Mixed hyperlipidemia: Secondary | ICD-10-CM

## 2019-07-03 MED ORDER — METFORMIN HCL 1000 MG PO TABS
ORAL_TABLET | ORAL | 0 refills | Status: DC
Start: 2019-07-03 — End: 2019-08-16

## 2019-07-03 MED ORDER — ATORVASTATIN CALCIUM 20 MG PO TABS
ORAL_TABLET | ORAL | 0 refills | Status: DC
Start: 2019-07-03 — End: 2019-07-31

## 2019-07-03 MED ORDER — ALLOPURINOL 300 MG PO TABS
ORAL_TABLET | ORAL | 0 refills | Status: DC
Start: 2019-07-03 — End: 2019-07-31

## 2019-07-03 MED ORDER — SERTRALINE HCL 25 MG PO TABS
ORAL_TABLET | ORAL | 0 refills | Status: DC
Start: 2019-07-03 — End: 2019-07-31

## 2019-07-03 MED ORDER — LOSARTAN POTASSIUM 25 MG PO TABS
ORAL_TABLET | ORAL | 0 refills | Status: DC
Start: 2019-07-03 — End: 2019-07-31

## 2019-07-04 ENCOUNTER — Ambulatory Visit: Payer: No Typology Code available for payment source

## 2019-07-06 ENCOUNTER — Ambulatory Visit: Payer: No Typology Code available for payment source

## 2019-07-11 ENCOUNTER — Ambulatory Visit: Payer: No Typology Code available for payment source

## 2019-07-13 ENCOUNTER — Ambulatory Visit: Payer: No Typology Code available for payment source

## 2019-07-18 ENCOUNTER — Ambulatory Visit: Payer: No Typology Code available for payment source

## 2019-07-20 ENCOUNTER — Ambulatory Visit: Payer: No Typology Code available for payment source

## 2019-07-25 ENCOUNTER — Ambulatory Visit: Payer: No Typology Code available for payment source | Attending: Family Medicine

## 2019-07-25 ENCOUNTER — Ambulatory Visit: Payer: No Typology Code available for payment source

## 2019-07-25 DIAGNOSIS — R279 Unspecified lack of coordination: Secondary | ICD-10-CM | POA: Insufficient documentation

## 2019-07-25 DIAGNOSIS — M6289 Other specified disorders of muscle: Secondary | ICD-10-CM | POA: Insufficient documentation

## 2019-07-25 DIAGNOSIS — R2689 Other abnormalities of gait and mobility: Secondary | ICD-10-CM

## 2019-07-25 DIAGNOSIS — G20 Parkinson's disease: Secondary | ICD-10-CM | POA: Insufficient documentation

## 2019-07-25 DIAGNOSIS — R269 Unspecified abnormalities of gait and mobility: Secondary | ICD-10-CM | POA: Insufficient documentation

## 2019-07-25 NOTE — Progress Notes (Signed)
[facility]  [address]  [place], [place]  [address]  Phone:  [phone]  Fax:  [phone]    PHYSICAL THERAPY DAILY TREATMENT NOTE    PATIENT: [name] [name] DOB: January 19, 1941   MR #: [id number]  AGE: 79 y.o.    FACILITY PROVIDER #: [id number] PRIMARY MD: [name], MD    HICN# Medicare Sub. Num: [id number] DIAGNOSES: Parkinson's disease [G20];Unspecified abnormalities of gait and mobility [R26.9];Other abnormalities of gait and mobility [R26.89]      Date of Service PT Received On: 07/25/19   Treatment Time Start Time: 1103 to Stop Time: 1200   Time Calculation Time Calculation (min): 57 min   Visit # PT Visit  PT Visit Number: 15 /30   Units Billed   Therapeutic Interventions  $ PT Therapeutic Activity (97530): 4 units     [name] referred for physical therapy services by: [name], MD    Education and Infection Prevention Regarding Covid-19 Pandemic:  Patient was educated on [facility]'s Policy and Procedure regarding COVID-19 care and safety precautions. Discussed and reviewed patient's medical history and personal risk factors and patient acknowledges exposure risk and wishes to proceed with care.    [name] Screen: Patient does not present with a temperature >/= 100.0, no new cough, no new throat pain, and no new shortness of breath.     Personal Protective Equipment Utilized:  Procedure Mask    Certification period, precautions, medications and allergies and goals copied from initial evaluation - reviewed and reconciled today.  [name], PT 07/25/2019    CERTIFICATION PERIOD:  05-04-19 to 08-03-19    TREATMENT DIAGNOSIS:  Gait Abnormality R26.9, Abnormal increase muscle tightness M62.89 and Lack of Coordination R27.9     Medications:  has a current medication list which includes the following prescription(s): allopurinol, ascorbic acid, aspirin ec, atorvastatin, accu-chek aviva plus, carbidopa-levodopa, coq10,  glucosamine-chondroitin, glucose blood, accu-chek soft touch, losartan, metformin, multiple vitamins-minerals, nitroglycerin, sertraline, and vitamin c.    Precautions:   HBP / CAD  Diabetic     Allergies:  No Known Allergies    Goals:  Short Term Goals:  To be met by 06-15-19  1. Patient will perform all bed mobility with no increased time/effort  to increase safety and independence.  2. Patient will perform sit to stand first attempt w/o UE assist to increase safety and independence.  3. Pt will perform car transfers w/ correct technique.  4.  Pt will ambulate w/ reciprocal arm swing and demonstrate trunk rotation 50 % of the time.    Long Term Goals:  To be met by 08-03-19    5. Patient will be able to negotiate 16  stairs with 1 rail assist for safety with ADL's / IADL's.  6. Patient will safely and effectively negotiate a curb for safety with community ambulation for attendance of medical appointments and IADL performance.  7. Patient will improve score on the ABC from 78 to 88 and minibest from 18/24 to 22/24 to decrease risk for falls and increase safety with functional mobility.  8. Pt will ambulate w/ reciprocal arm swing and demonstrate trunk rotation 100 % of the time.  9.  Pt will be I comprehensive HEP and carry over on a regular basis.  10.  Pt will demo 10 degrees of ankle df, full knee extension and hams/hip flexors WFL to allow more normal gait.       *Working Toward the  Above Goals: (copied from last visit)*    GOAL# Progress Toward Goals   1 progressing   2 met   3 met   4 Progressing   5 met   6 Progressing       EXAMINATION & CLINICAL FINDINGS:  Subjective Report: Pt has been away due to death in family, getting house ready to sell in [place]. Has more work to do on house. No change in stomach issues-still gets N/V daily multiple times/day.  To see GI doc on 1-2 weeks. Starts LSVT [facility] next week 4 x/wk    Patient presents for physical therapy today with caregiver. [name]-she comes in 3 days/wk for  5 hrs    Observations: Note patient ambulating into clinic w/ minimal arm swing, stooped posture    Pain:    Patient denies back pain today    Outcomes assessed today:   No outcomes assessed today.    INTERVENTION:  Treatment Performed:  TA:   Seated BIG ex: floor to ceilingx5, seated side to side x 5 each  -2 BIG lunges F/S x5 w/ 8 s hold w/ finger flicks or opp and loud counting  -back stepping reaction w/ 8 count hold  -fast lunges reciprocally to mimic stepping reactions F/S/Bx5 each    Rock and reach [employer], UE's only, then together  [employer] and twist L/R, practiced weight shift and pivoting heel first.    Sit<>stand x10    -practiced BIG walking w/ more erect posture with reciprocal arm swing, walking B, switching F/B, side stepping/high stepping, braiding    -agility: fast feet F/B    -balance: stepping up /off big red foam    Patient Education: [name] present and practiced BIG ex w/ patient so she can A at home. Educated in BIG program being 4x/wk w/ high intensity.   Pt. did verbalize and demonstrate an understanding of this education   Home Exercise Program was issued today..for LAQ.  See handout for details.     PHYSICAL THERAPY EVALUATION  Response to treatment: Note still decreased eccentric control of quads when sitting in chair. Better mental clarity today except last 10 min of session pt has difficulty following instructions-note more bradykinesia as he tires.  Needs inc cues for all activities.  Multiple mild  LOB-able to self correct w/ stepping strategy.  w/o corrective stepping-needed max A to regain.  Caregiver aware of need to guard pt w/ BIG program and any standing exercises.      Impairments in body function and structure:  Decreased ROM  Decreased Strength  Impaired Balance  Impaired Gait  Decreased safety/judgement- pt nearly falling each time he had to step over object-did not stop to judge distance, plowed through w/o hesitation.   Impulsivity .      Activity Limitations / Restricted  Participation:  Patient is unable to perform bed mobility independently resulting in risk for skin breakdown and higher level of care.  Patient is unable to ambulate safely for community distances for safety with attendance of medical appointments and performance of IADL's.  Patient exhibits impaired posture that affects balance and increases risk for falls.  The following barriers to care may affect patient prognosis:  Low motivation-pt came to therapy because son forced him.    PLAN:  1 last session to ensure he can do BIG program w/ caregivers help on their own at home.  OT will be scheduled when LOUD program finishes up.     Therapist Signature:    [name]  PT [phone]  [facility]  Adult and Pediatric Rehab  [address]  [place], [place] [address]  [phone]    07/25/2019

## 2019-07-27 ENCOUNTER — Ambulatory Visit: Payer: No Typology Code available for payment source

## 2019-07-27 ENCOUNTER — Ambulatory Visit: Payer: No Typology Code available for payment source | Attending: Family Medicine

## 2019-07-27 DIAGNOSIS — R269 Unspecified abnormalities of gait and mobility: Secondary | ICD-10-CM | POA: Insufficient documentation

## 2019-07-27 DIAGNOSIS — R279 Unspecified lack of coordination: Secondary | ICD-10-CM | POA: Insufficient documentation

## 2019-07-27 DIAGNOSIS — M6289 Other specified disorders of muscle: Secondary | ICD-10-CM | POA: Insufficient documentation

## 2019-07-27 DIAGNOSIS — G20 Parkinson's disease: Secondary | ICD-10-CM | POA: Insufficient documentation

## 2019-07-27 NOTE — Progress Notes (Signed)
[facility]  [address]  [place], [place]  [address]  Phone:  [phone]  Fax:  [phone]    PHYSICAL THERAPY DAILY TREATMENT NOTE    PATIENT: [name] [name] DOB: Dec 13, 1940   MR #: [id number]  AGE: 79 y.o.    FACILITY PROVIDER #: [id number] PRIMARY MD: [name] Doom, MD    HICN# Medicare Sub. Num: [id number] DIAGNOSES: Parkinson's disease [G20];Unspecified abnormalities of gait and mobility [R26.9];Other abnormalities of gait and mobility [R26.89]      Date of Service PT Received On: 07/27/19   Treatment Time Start Time: 1105 to Stop Time: 1200   Time Calculation Time Calculation (min): 55 min   Visit # PT Visit  PT Visit Number: 16 /30   Units Billed   Therapeutic Interventions  $ PT Therapeutic Activity (97530): 4 units     [name] [name] referred for physical therapy services by: [name] Doom, MD    Education and Infection Prevention Regarding Covid-19 Pandemic:  Patient was educated on [facility]'s Policy and Procedure regarding COVID-19 care and safety precautions. Discussed and reviewed patient's medical history and personal risk factors and patient acknowledges exposure risk and wishes to proceed with care.    [name] Screen: Patient does not present with a temperature >/= 100.0, no new cough, no new throat pain, and no new shortness of breath.     Personal Protective Equipment Utilized:  Procedure Mask    Certification period, precautions, medications and allergies and goals copied from initial evaluation - reviewed and reconciled today.  [name] [name], PT 07/27/2019    CERTIFICATION PERIOD:  05-04-19 to 08-03-19    TREATMENT DIAGNOSIS:  Gait Abnormality R26.9, Abnormal increase muscle tightness M62.89 and Lack of Coordination R27.9     Medications:  has a current medication list which includes the following prescription(s): allopurinol, ascorbic acid, aspirin ec, atorvastatin, accu-chek aviva plus, carbidopa-levodopa, coq10,  glucosamine-chondroitin, glucose blood, accu-chek soft touch, losartan, metformin, multiple vitamins-minerals, nitroglycerin, sertraline, and vitamin c.    Precautions:   HBP / CAD  Diabetic     Allergies:  No Known Allergies    Goals:  Short Term Goals:  To be met by 06-15-19  1. Patient will perform all bed mobility with no increased time/effort  to increase safety and independence.  2. Patient will perform sit to stand first attempt w/o UE assist to increase safety and independence.  3. Pt will perform car transfers w/ correct technique.  4.  Pt will ambulate w/ reciprocal arm swing and demonstrate trunk rotation 50 % of the time.    Long Term Goals:  To be met by 08-03-19    5. Patient will be able to negotiate 16  stairs with 1 rail assist for safety with ADL's / IADL's.  6. Patient will safely and effectively negotiate a curb for safety with community ambulation for attendance of medical appointments and IADL performance.  7. Patient will improve score on the ABC from 78 to 88 and minibest from 18/24 to 22/24 to decrease risk for falls and increase safety with functional mobility.  8. Pt will ambulate w/ reciprocal arm swing and demonstrate trunk rotation 100 % of the time.  9.  Pt will be I comprehensive HEP and carry over on a regular basis.  10.  Pt will demo 10 degrees of ankle df, full knee extension and hams/hip flexors WFL to allow more normal gait.       *Working Toward the  Above Goals: (copied from last visit)*    GOAL# Progress Toward Goals   1/2/3 met   4/8 inconsistent to w/ cues   5/6 met   7 Progressing:minibest improved by 1 point, ABC improved to 82%   9 met   10 Met except B ankle df still limited 8 degrees B       EXAMINATION & CLINICAL FINDINGS:  Subjective Report: Caregiver reports pt did entire BIG program this morning with her and had no problem.  No N or V today.  To see GI doc on 1-2 weeks. Starts LSVT [facility] next week 4 x/wk    Patient presents for physical therapy today with  caregiver. [name] [name]-she comes in 3 days/wk for 5 hrs    Observations: Note patient ambulating into clinic w/ minimal arm swing, stooped posture    Pain:    Patient denies back pain today    Outcomes assessed today:   Minibest:19/24  ABC:82%    INTERVENTION:  Treatment Performed:  TA:   -Mini best administered  -practiced bed mobility  -reviewed and performed comprehensive HEP of supine stretches for LB/LE's, standing at stairs for hip flexor/hams/gastroc stretch, supine bridges  -doorway shoulder stretch, corner pec stretch  -reviewed standing HEP of squats, hip abd etc    -written cues for BIG program about walking drills-pt performed: big walk, march, side step, braiding,     -practiced BIG walking w/ more erect posture with reciprocal arm swing, walking B, switching F/B, side stepping/high stepping, braiding      Patient Education: [name] [name] present and observing HEP ex w/ patient so she can A at home. Educated on switching routine, one day do BIG, next day do standing strengthening, stretches etc.   Pt. did verbalize and demonstrate an understanding of this education   Home Exercise Program was issued today..for LAQ.  See handout for details.     PHYSICAL THERAPY EVALUATION  Response to treatment: Note still mild decreased eccentric control of quads when sitting in chair. Improved timing w/ bed mobility.  Still holds UE's at sides unless cued while walking/standign.  Note increased stiffness in shoulders-pt needs encouragement to stretch them overhead in bed.    Pt and cargeiver feel confident w/ HEP.  WHen finishes LOUD program -would benefit from OT for ADL's/UE strengthening/ROM. Pt has met many goals, still stiffness B ankles-to continue w/ HEP to continue to work on this.      Impairments in body function and structure:  Decreased ROM  Decreased Strength  Impaired Balance  Impaired Gait  Decreased safety/judgement- pt nearly falling each time he had to step over object-did not stop to judge distance, plowed  through w/o hesitation.   Impulsivity .      Activity Limitations / Restricted Participation:  Patient is unable to perform bed mobility independently resulting in risk for skin breakdown and higher level of care.  Patient is unable to ambulate safely for community distances for safety with attendance of medical appointments and performance of IADL's.  Patient exhibits impaired posture that affects balance and increases risk for falls.  The following barriers to care may affect patient prognosis:  Low motivation-pt came to therapy because son forced him.    PLAN:  [place] PT    Therapist Signature:    [name] [name] PT [phone]  [facility]  Adult and Pediatric Rehab  [address]  [place], [place] [address]  [phone]    07/27/2019

## 2019-07-30 ENCOUNTER — Other Ambulatory Visit (INDEPENDENT_AMBULATORY_CARE_PROVIDER_SITE_OTHER): Payer: Self-pay | Admitting: Family Medicine

## 2019-07-30 DIAGNOSIS — E119 Type 2 diabetes mellitus without complications: Secondary | ICD-10-CM

## 2019-07-30 DIAGNOSIS — I10 Essential (primary) hypertension: Secondary | ICD-10-CM

## 2019-07-30 DIAGNOSIS — M109 Gout, unspecified: Secondary | ICD-10-CM

## 2019-07-30 DIAGNOSIS — E782 Mixed hyperlipidemia: Secondary | ICD-10-CM

## 2019-08-03 ENCOUNTER — Ambulatory Visit: Payer: No Typology Code available for payment source

## 2019-08-03 DIAGNOSIS — R490 Dysphonia: Secondary | ICD-10-CM

## 2019-08-03 DIAGNOSIS — R1313 Dysphagia, pharyngeal phase: Secondary | ICD-10-CM

## 2019-08-03 DIAGNOSIS — R498 Other voice and resonance disorders: Secondary | ICD-10-CM

## 2019-08-03 NOTE — SLP Eval Note (Cosign Needed Addendum)
[facility]  [address] [place] [address]  Phone: [phone]      Fax: [phone]    SPEECH THERAPY EVALUATION AND PLAN OF CARE    *I agree to the below stated plan of care*                  Physician Signature      Date    REFERRING MD:  Laurie Panda*    PATIENT: [name] [name] DOB: 05/09/1940   MR #: [id number]  AGE: 79 y.o.    FACILITY PROVIDER #: [id number] PRIMARY MD: [name], MD    CERTIFICATION DATES:     SLP Received On: 08/03/19 to 11/03/19 START OF CARE:  SLP Received On: 08/03/19   DIAGNOSES:  Parkinson's disease [G20]  TREATMENT DIAGNOSIS: Neurological Voice Impairment - Dysphonia R49.0 Hypophonia R49.8       Date of Service SLP Received On: 08/03/19   Treatment Time Start Time: 0908 to Stop Time: 1000   Time Calculation Time Calculation (min): 52 min   Visit # SLP Visit Number: 1   Units Billed SLP Evaluations  $ Analysis Voice and Resonannce Behavioral  & Qual (40102): 1 Procedure  $ SLP Swallow Eval (72536): 1 Procedure       Order received for Speech-Language Evaluation and Treatment.    Education and Infection Prevention Regarding Covid-19 Pandemic:  Patient was educated on [employer] and Procedure regarding COVID-19 care and safety precautions. Discussed and reviewed patient's medical history and personal risk factors and patient acknowledges exposure risk and wishes to proceed with care.    [id number] Screen: Patient does not present with a temperature >/= 100.0, no new cough, no new throat pain, and no new shortness of breath.     Personal Protective Equipment Utilized:  Goggles and Procedure Mask    Precautions and Contraindications: NONE  No Known Allergies    Medications:  has a current medication list which includes the following prescription(s): allopurinol, ascorbic acid, aspirin ec, atorvastatin, rytary, coq10, glucosamine-chondroitin, losartan, metformin, multiple vitamins-minerals, nitroglycerin,  and sertraline.    Past Medical/Surgical History:  No past medical history on file.  No past surgical history on file.     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  When I talk, I feel like I'm talking to the whole room.  When I talk up, I sound like I'm screaming, to me."  "It gets quieter t/o the day.  By dark, I talk really low."    Pain:    Patient denies pain today.    OBJECTIVE MEASUREMENTS AND CLINICAL OBSERVATIONS:  Observations:      History of Present Illness: [name] [name] is a 79 y.o. male with h/o Parkinson's disease and recent c/o low volume and decreased intelligibility of speech for listeners.  Pt first noticed symptoms of PD in 2017 with symptoms of vocal volume decline.  He was diagnosed that same year by a general neurologist and referred to Dr. Roque Cash.  Now he notices an unsteady quality in voice and an decreased rate of speech.  PD has reportedly caused him to speak less due to frustration repeating himself.  Pt perceives that about 50-60% of  speech is intelligible to unfamiliar listeners.  Pt stands to be at risk for communicating in safety situations due to unclear  communication in person and over the telephone.    Complaints of coughing and throat clearing while eating.  Projectile expelling of foods.  Liquids are more difficult.    Prior level of function:    Lives with son, Regular solid diet and Thin liquid diet    Patient Goal:    "I want my voice to be stronger."    OBJECTIVE MEASUREMENTS AND CLINICAL OBSERVATIONS:  Observations:    Pt provided full verbal description of medical history and symptoms related to this acute change.    The following tests were performed today:  Bedside Swallow Exam and LSVT Loud Companion Software  Results listed below.    OBJECTIVE FINDINGS                       Pt was assessed with BSSE to have functional strength and ROM to all  oral/pharyngeal structures for both speech and swallowing.  Oral strength and ROM noted to be [facility] for lingual and labial movements.  No signs of aspiration were noted on 6 oz. thin water by cup or four pretzels.  Immediate cough/clear after straw sips.    Oral/Motor:  Appearance: WFL  Labial ROM: WFL  Labial Symmetry: WFL  Labial Strength: WFL  Labial Sensation: WFL  Lingual ROM: WFL  Lingual Symmetry: WFL  Lingual Strength: WFL  Lingual Sensation: WFL  Buccal ROM: WFL  Facial Symmetry: WFL  Buccal Strength: WFL  Facial Sensation: WFL  Mandible: WFL  Velum: WFL  Gag: absent  Dentition: WFL for mastication    Swallowing:  PO trials and Presentations:   Regular solids: pretzels (4)  Thin liquids: water  Presentation:  Via cup  Via straw  Single sips  Multiple Swallows in succession   Self-fed  Independent    Solid observations:  WFL    Liquids observations:  No signs or symptoms of aspiration observed via cup  Immediate cough via straw  Immediate throat clear via straw    Strategies in place:  none    Swallowing strategy recommendations:  Alternate solids/liquids, No talking during meals and Cup sips only    Voice:  Breath support:  Diaphragmatic at rest, Diaphragmatic in phonation, Inconsistent, Sustained /a/:  Low, Average time in seconds: 11, Shallow    Vocal quality:  Hoarse  Breathy  Dysphonia observed  Hypophonia observed    Pt was assessed for voice disturbance through LSVT [employer] Loud protocol and utilizing software established by [employer].  Results were as follows: Vowel duration: Average 10.9 seconds, 68.8 dB; Pitch range exercise: High 245 Hz, average highs 232 Hz, 72.6 dB.  Low 146 Hz, average low: 153.3 Hz, 68.2 dB; Text reading exercise: 71.3 dB; Monitored conversation exercise: 68.8 dB and 70.8 dB.  Pt's return of demonstrated volumes showed potential to reach 82.6 dB in vowel duration with 11 seconds, 80.8 dB in high pitch (293 Hz), and 73 dB in low pitch (168 Hz), with 85.7dB in word/phrase  repetitions.    Outcomes:    Clinical judgement; LSVT Companion    ASSESSMENT:: [name] is a 79 y.o. male presents to outpatient therapy today with a Severe speech disturbance c/b poor breath support, limited pitch range and low volume for voice, leaving patient with approximately 50% intelligibility to an unfamiliar listener and in a noisy environment.  His voice poses a severe risk for effective communication in the event of an emergency and has negatively impacted hisability to communicate in His daily environment.  His decreased volume affects his ability to communicate.  Skilled services will focus on 1) Training in specific techniques for Parkinson's symptoms, 2) Analysis of patient behaviors and how it effects speech, 3) Modifying and shaping patient behavior, 4) Redirect patient for carryover, and 5) Assess progress and advance through program.  Therapy is focused on a restorative nature of the voice and vocal weaknesses associated in the decline as a result of Parkinson's Disease.    It is medically necessary for Arlo Butt to recieve skilled speech therapy intervention in order to address the above impairments and functional limitations.      Aspiration Risk: yes (mild at this time) due to the declining nature of PD    Patient Education:  Patient educated on signs and symptoms of dysphagia and LSVT protocol and verbalized an understanding of the goals and benefits of therapy.     Rehabilitation Potential:    Excellent due to his commitment to intense schedule to gain neuroplasticity and due to caregiver support.    Treatment Today:  EVALUATION:   Voice Evaluation 92524  Dysphagia Evaluation 92610    PLAN:    Patient/Caregiver Education and Training  82956 Speech / Language Treatment  [phone] Dysphagia Treatment    PLAN:    Frequency of treatment:    4 times per week for 4 weeks for the LSVT skilled portion for protocol, 1x/mth for f/u as needed during pt's 2 add'l months of HEP to complete the  protocol.    Short Term Goals:  To be met by 09/21/19  1. Increase vocal loudness to 80 dB to increase vocal respiratory support for functional communication.  2. Increase sustained vowel phonation to 15+ seconds to increase vocal respiratory support for functional communication.  3. Increase pitch range by >10 Hz higher and lower than baseline (140 Hz low, 300 Hz high).    Long Term Goals:  To be met by 11/03/19  1. Increase volume for functional phrases to 80 dB in order to increase safety in the home and community.  2. Increase vocal loudness to 75dB during reading increase respiratory support for functional communication.  3. Increase vocal loudness to >75 dB during conversational speech.  4. Pt will demonstrate independence in pharyngeal strengthening exercises to decrease risk of aspiration.    It has been a pleasure to evaluate [name].  Please contact me with any questions or concerns regarding this patient's evaluation or ongoing therapy.    Therapist Signature:    Colbi Schiltz, MEd, Physicist, medical Therapist  [facility]  Outpatient Specialty Rehabilitation   [email or web address]    SLP Received On: 08/03/19

## 2019-08-07 ENCOUNTER — Ambulatory Visit: Payer: No Typology Code available for payment source

## 2019-08-07 DIAGNOSIS — R498 Other voice and resonance disorders: Secondary | ICD-10-CM

## 2019-08-07 DIAGNOSIS — R1313 Dysphagia, pharyngeal phase: Secondary | ICD-10-CM

## 2019-08-07 DIAGNOSIS — R490 Dysphonia: Secondary | ICD-10-CM

## 2019-08-07 NOTE — Progress Notes (Signed)
[facility]  [address], [address]  [place], [place]  [address]  Phone:  [phone]  Fax:  [phone]    SPEECH THERAPY DAILY TREATMENT NOTE  PATIENT: [name] [name] DOB: September 17, 1940   MR #: [id number]  AGE: 79 y.o.    FACILITY PROVIDER #: [id number] PRIMARY MD: [name], MD    CERTIFICATION DATES:     SLP Received On: 08/03/19 to 11/03/19 START OF CARE:  SLP Received On: 08/03/19   DIAGNOSES:  Parkinson's disease [G20]  TREATMENT DIAGNOSIS: Neurological Voice Impairment - Dysphonia R49.0 Hypophonia R49.8; Dysphagia, pharyngeal phase R13.13      Date of Service SLP Received On: 08/07/19   Treatment Time Start Time: 1000 to Stop Time: 1100   Time Calculation Time Calculation (min): 60 min   Visit # SLP Visit Number: 2   Units Billed   SLP Treatments  $ SLP Treatment Indiv [phone]): 1 Procedure     Education and Infection Prevention Regarding Covid-19 Pandemic:  Patient was educated on [facility]'s Policy and Procedure regarding COVID-19 care and safety precautions. Discussed and reviewed patient's medical history and personal risk factors and patient acknowledges exposure risk and wishes to proceed with care.    [id number] Screen: Patient does not present with a temperature >/= 100.0, no new cough, no new throat pain, and no new shortness of breath.     Personal Protective Equipment Utilized:  Goggles and Procedure Mask    Medications:  has a current medication list which includes the following prescription(s): allopurinol, ascorbic acid, aspirin ec, atorvastatin, rytary, coq10, glucosamine-chondroitin, losartan, metformin, multiple vitamins-minerals, nitroglycerin, and sertraline.  Patient/Caregiver does not report changes to medication at this time.    Precautions:   None.    Allergies:  No Known Allergies    SUBJECTIVE REPORT:  Patient presents for speech therapy today alone.     Patient reports, "We live in an apartment.  Not sure how my neighbors will be  with this."  Pt did not arrive with phrases and SLP assisted him in building.     Pain:    Patient denies pain today.    OBJECTIVE FINDINGS:  Interventions / Goal Status:  Goals copied from last visit:  Short Term Goals:  To be met by 09/21/19  1. Increase vocal loudness to 80 dB to increase vocal respiratory support for functional communication.  2. Increase sustained vowel phonation to 15+ seconds to increase vocal respiratory support for functional communication.  3. Increase pitch range by >10 Hz higher and lower than baseline (140 Hz low, 300 Hz high).    Long Term Goals:  To be met by 11/03/19  1. Increase volume for functional phrases to 80 dB in order to increase safety in the home and community.  2. Increase vocal loudness to 75dB during reading increase respiratory support for functional communication.  3. Increase vocal loudness to >75 dB during conversational speech.  4. Pt will demonstrate independence in pharyngeal strengthening exercises to decrease risk of aspiration.    GOAL # INTERVENTIONS PROGRESS   STG 1 and 2 LSVT:  SLP utilized LSVT Companion software along with verbal and visual cues to promote vocal performance.  LSVT:  Patient was trained in LSVT home program promoting vocal strength and performance.  Sustained /a/: 78.3 dB; 12.7 seconds (* 0/15)   STG 3 LSVT:  SLP utilized LSVT Companion software along with verbal and visual cues to promote vocal performance.  LSVT:  Patient  was trained in LSVT home program promoting vocal strength and performance.  Hi /a/: 253.8 dB; 253.8 Hz (* 1/15)  Low /a/:  66.8 dB; 144.6 Hz (* 0/15)    LTG 1 LSVT:  SLP utilized LSVT Companion software along with verbal and visual cues to promote vocal performance.  LSVT:  Patient was trained in LSVT home program promoting vocal strength and performance.  Functional phrases: 73.6 dB (* 0/5)   STG 2 LSVT:  SLP utilized LSVT Companion software along with verbal and visual cues to promote vocal performance.  LSVT:  Patient  was trained in LSVT home program promoting vocal strength and performance.  words, 76.2 dB       Education/Home Exercise Program:    Patient was present in the session and was an active participant.  Patient was educated on strategies and techniques used in treatment session.  Patient was educated on PD pt perspective of output and use of neural plasticity to show brain need for more output in order to improve parameters of voice.  LSVT HEP issued: 6 Loud/long "ah", 6 high-pitched "ah", 6 low-pitched "ah", once through functional phrases and sheet in packet titled "Words for Speech Exercises". Pt to take note to use exaggerated facial movements and voice inflections during all phrases. Pt to take full diaphragmatic breaths prior to each.    ASSESSMENT:: [name] [name] is a 79 y.o. 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    Severe speech disturbance c/b poor breath support, limited pitch range and low volume for voice, leaving patient with approximately 50% intelligibility to an unfamiliar listener and in a noisy environment.  His voice poses a severe risk for effective communication in the event of an emergency and has negatively impacted hisability to communicate in His daily environment.  His decreased volume affects his ability to communicate.  Skilled services will focus on 1) Training in specific techniques for Parkinson's symptoms, 2) Analysis of patient behaviors and how it effects speech, 3) Modifying and shaping patient behavior, 4) Redirect patient for carryover, and 5) Assess progress and advance through program.  Therapy is focused on a restorative nature of the voice and vocal weaknesses associated in the decline as a result of Parkinson's Disease.     It is medically necessary for [name] [name] to recieve skilled speech therapy intervention in order to address the above impairments and functional limitations.       Patient presents with the following functional limitations:  Patient has difficulty with communicating Conversational/Spontaneous Speech.  Patient is unable to effectively provide breath support for sustained vowel sounds and audible volume for daily needs/wants, for the purpose of communicating in a safety event..  Patient is unable to effectively communicate basic wants and needs.    Without intervention, patient is at risk for:  Decreased social interactions that may lead to further cognitive decline and contribute to further medical complications and increased fall risk.  Inability to properly support breath for physical activity and conversations which may contribute to stress and anxiety during these activities with patient feeling lack of air intake.    Patient is progressing well toward above mentioned functional goals.    PLAN:   It is medically necessary for [name] [name] to recieve skilled speech therapy intervention in order to address the above impairments and functional limitations.    Recommend continue per plan of care to work toward improving breath support in order to  increase parameters of voice: duration, pitch range and volume in order to increase patient safety and independence at home, community, work by communicate safety and intent effectively. and prevention of further functional decline of communication capabilities.    NEXT VISIT: Focus on hierarchy of words    Therapist Signature:    Jowel Waltner, MEd, Physicist, medical Therapist  [facility]  Outpatient Specialty Rehabilitation   [email or web address]    08/07/2019

## 2019-08-08 ENCOUNTER — Ambulatory Visit: Payer: No Typology Code available for payment source

## 2019-08-08 DIAGNOSIS — R1313 Dysphagia, pharyngeal phase: Secondary | ICD-10-CM

## 2019-08-08 DIAGNOSIS — R490 Dysphonia: Secondary | ICD-10-CM

## 2019-08-08 DIAGNOSIS — R498 Other voice and resonance disorders: Secondary | ICD-10-CM

## 2019-08-08 NOTE — Progress Notes (Signed)
[facility]  [address], [address]  [place], [place]  [address]  Phone:  [phone]  Fax:  [phone]    SPEECH THERAPY DAILY TREATMENT NOTE    PATIENT: [name] [name] DOB: 08-30-1940   MR #: [id number]  AGE: 79 y.o.    FACILITY PROVIDER #: [id number] PRIMARY MD: [name], MD    CERTIFICATION DATES:     SLP Received On: 08/03/19 to 11/03/19 START OF CARE:  SLP Received On: 08/03/19   DIAGNOSES:  Parkinson's disease [G20]  TREATMENT DIAGNOSIS: Neurological Voice Impairment - Dysphonia R49.0 Hypophonia R49.8        Date of Service SLP Received On: 08/08/19   Treatment Time Start Time: 1015 to Stop Time: 1100   Time Calculation Time Calculation (min): 45 min   Visit # SLP Visit Number: 3   Units Billed   SLP Treatments  $ SLP Treatment Indiv [phone]): 1 Procedure     Education and Infection Prevention Regarding Covid-19 Pandemic:  Patient was educated on [facility]'s Policy and Procedure regarding COVID-19 care and safety precautions. Discussed and reviewed patient's medical history and personal risk factors and patient acknowledges exposure risk and wishes to proceed with care.    [id number] Screen: Patient does not present with a temperature >/= 100.0, no new cough, no new throat pain, and no new shortness of breath.     Personal Protective Equipment Utilized:  Goggles and Procedure Mask    Medications:  has a current medication list which includes the following prescription(s): allopurinol, ascorbic acid, aspirin ec, atorvastatin, rytary, coq10, glucosamine-chondroitin, losartan, metformin, multiple vitamins-minerals, nitroglycerin, and sertraline.  Patient/Caregiver does not report changes to medication at this time.    Precautions:   None.    Allergies:  No Known Allergies    SUBJECTIVE REPORT:  Patient presents for speech therapy today alone.     Patient reports, "I think I did all right."  On his exercises from the evening.     Pain:    Patient denies  pain today.    OBJECTIVE FINDINGS:  Interventions / Goal Status:  Goals copied from last visit:  Short Term Goals:  To be met by 09/21/19  1. Increase vocal loudness to 80 dB to increase vocal respiratory support for functional communication.  2. Increase sustained vowel phonation to 15+ seconds to increase vocal respiratory support for functional communication.  3. Increase pitch range by >10 Hz higher and lower than baseline (140 Hz low, 300 Hz high).    Long Term Goals:  To be met by 11/03/19  1. Increase volume for functional phrases to 80 dB in order to increase safety in the home and community.  2. Increase vocal loudness to 75dB during reading increase respiratory support for functional communication.  3. Increase vocal loudness to >75 dB during conversational speech.  4. Pt will demonstrate independence in pharyngeal strengthening exercises to decrease risk of aspiration.      GOAL # INTERVENTIONS PROGRESS   STG 1 and 2 LSVT:  SLP utilized LSVT Companion software along with verbal and visual cues to promote vocal performance.  LSVT:  Patient was trained in LSVT home program promoting vocal strength and performance.  Sustained /a/: 76.9 dB; 18.3 seconds (* 1/15)   STG 3 LSVT:  SLP utilized LSVT Companion software along with verbal and visual cues to promote vocal performance.  LSVT:  Patient was trained in LSVT home program promoting vocal strength and performance.  Hi /  a/: 81 dB; 310.9 Hz (* 9/15)  Low /a/:  70 dB; 137.3 Hz (* 2/15)    LTG 1 LSVT:  SLP utilized LSVT Companion software along with verbal and visual cues to promote vocal performance.  LSVT:  Patient was trained in LSVT home program promoting vocal strength and performance.  Functional phrases: 74.8 dB (* 0/5)   LTG 2 LSVT:  SLP utilized LSVT Companion software along with verbal and visual cues to promote vocal performance.  LSVT:  Patient was trained in LSVT home program promoting vocal strength and performance.  words, 77.6 dB        Education/Home Exercise Program:    Patient was present in the session and was an active participant.  LSVT HEP issued: 6 Loud/long "ah", 6 high-pitched "ah", 6 low-pitched "ah", once through functional phrases and sheet in packet titled "Words for Speech Exercises". Pt to take note to use exaggerated facial movements and voice inflections during all phrases. Pt to take full diaphragmatic breaths prior to each.    ASSESSMENT:: [name] [name] is a 80 y.o. 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    Severe speech disturbance c/b poor breath support, limited pitch range and low volume for voice, leaving patient with approximately 50% intelligibility to an unfamiliar listener and in a noisy environment.  His voice poses a severe risk for effective communication in the event of an emergency and has negatively impacted hisability to communicate in His daily environment.  His decreased volume affects his ability to communicate.  Skilled services will focus on 1) Training in specific techniques for Parkinson's symptoms, 2) Analysis of patient behaviors and how it effects speech, 3) Modifying and shaping patient behavior, 4) Redirect patient for carryover, and 5) Assess progress and advance through program.  Therapy is focused on a restorative nature of the voice and vocal weaknesses associated in the decline as a result of Parkinson's Disease.     It is medically necessary for [name] [name] to recieve skilled speech therapy intervention in order to address the above impairments and functional limitations.      Patient presents with the following functional limitations:  Patient has difficulty with communicating Conversational/Spontaneous Speech.  Patient is unable to effectively provide breath support for sustained vowel sounds and audible volume for daily needs/wants, for the purpose of communicating in a safety event..  Patient  is unable to effectively communicate basic wants and needs.    Without intervention, patient is at risk for:  Decreased social interactions that may lead to further cognitive decline and contribute to further medical complications and increased fall risk.  Inability to properly support breath for physical activity and conversations which may contribute to stress and anxiety during these activities with patient feeling lack of air intake.    Patient is progressing well toward above mentioned functional goals.    PLAN:   It is medically necessary for [name] [name] to recieve skilled speech therapy intervention in order to address the above impairments and functional limitations.    Recommend continue per plan of care to work toward improving breath support in order to increase parameters of voice: duration, pitch range and volume in order to increase patient safety and independence at home, community, work by Scientist, physiological and intent effectively. and prevention of further functional decline of communication capabilities.    NEXT VISIT: Focus on hierarchy of phrases    Therapist Signature:  Nayzeth Altman, MEd, Physicist, medical Therapist  [facility]  Outpatient Specialty Rehabilitation   [email or web address]    08/08/2019

## 2019-08-09 ENCOUNTER — Ambulatory Visit: Payer: No Typology Code available for payment source

## 2019-08-09 DIAGNOSIS — R498 Other voice and resonance disorders: Secondary | ICD-10-CM

## 2019-08-09 DIAGNOSIS — R1313 Dysphagia, pharyngeal phase: Secondary | ICD-10-CM

## 2019-08-09 DIAGNOSIS — R490 Dysphonia: Secondary | ICD-10-CM

## 2019-08-09 NOTE — Progress Notes (Signed)
[facility]  [name], [address]  [place], [place]  [address]  Phone:  [phone]  Fax:  [phone]    SPEECH THERAPY DAILY TREATMENT NOTE    PATIENT: [name] [name] DOB: [birth year]   MR #: [id number]  AGE: 79 y.o.    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 Discussed and reviewed patient's medical history and personal risk factors and patient acknowledges exposure risk and wishes to proceed with care.    LKGMW10 Screen: Patient does not present with a temperature >/= 100.0, no new cough, no new throat pain, and no new shortness of breath.     Personal Protective Equipment Utilized:  Goggles and Procedure Mask    Medications:  has a current medication list which includes the following prescription(s): allopurinol, ascorbic acid, aspirin ec, atorvastatin, rytary, coq10, glucosamine-chondroitin, losartan, metformin, multiple vitamins-minerals, nitroglycerin, and sertraline.  Patient/Caregiver does not report changes to medication at this time.    Precautions:   None.    Allergies:  No Known Allergies    SUBJECTIVE REPORT:  Patient presents for speech therapy today alone.     Patient reports, "I'm a little tired today."    Pain:    Patient denies pain today.    OBJECTIVE  FINDINGS:  Interventions / Goal Status:  Goals copied from last visit:  Short Term Goals:  To be met by 09/21/19  1. Increase vocal loudness to 80 dB to increase vocal respiratory support for functional communication.  2. Increase sustained vowel phonation to 15+ seconds to increase vocal respiratory support for functional communication.  3. Increase pitch range by >10 Hz higher and lower than baseline (140 Hz low, 300 Hz high).    Long Term Goals:  To be met by 11/03/19  1. Increase volume for functional phrases to 80 dB in order to increase safety in the home and community.  2. Increase vocal loudness to 75dB during reading increase respiratory support for functional communication.  3. Increase vocal loudness to >75 dB during conversational speech.  4. Pt will demonstrate independence in pharyngeal strengthening exercises to decrease risk of aspiration.      GOAL # INTERVENTIONS PROGRESS   STG 1 and 2 LSVT:  SLP utilized LSVT Companion software along with verbal and visual cues to promote vocal performance.  LSVT:  Patient was trained in LSVT home program promoting vocal strength and performance.  Sustained /a/: 81.8 dB; 18.8 seconds (* 10/15)   STG 3 LSVT:  SLP utilized LSVT Companion software along with verbal and visual cues to promote vocal performance.  LSVT:  Patient was trained in LSVT home program promoting vocal strength and performance.  Hi /a/: 83.2 dB; 288.4 Hz (* 4/15)  Low /  a/:  74.9 dB; 163.7 Hz (* 0/15)    LTG 1 LSVT:  SLP utilized LSVT Companion software along with verbal and visual cues to promote vocal performance.  LSVT:  Patient was trained in LSVT home program promoting vocal strength and performance.  Functional phrases: 77.6 dB (* 0/5)   LTG 2 LSVT:  SLP utilized LSVT Companion software along with verbal and visual cues to promote vocal performance.  LSVT:  Patient was trained in LSVT home program promoting vocal strength and performance.  phrases, 79.8 dB       Education/Home Exercise  Program:    Patient was present in the session and was an active participant.  LSVT HEP issued: 6 Loud/long "ah", 6 high-pitched "ah", 6 low-pitched "ah", once through functional phrases and sheet in packet titled "Phrases for Speech Exercises". Pt to take note to use exaggerated facial movements and voice inflections during all phrases. Pt to take full diaphragmatic breaths prior to each.    ASSESSMENT:: [name] [name] is a 79 y.o. 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    Severe speech disturbance c/b poor breath support, limited pitch range and low volume for voice, leaving patient with approximately 50% intelligibility to an unfamiliar listener and in a noisy environment.  His voice poses a severe risk for effective communication in the event of an emergency and has negatively impacted hisability to communicate in His daily environment.  His decreased volume affects his ability to communicate.  Skilled services will focus on 1) Training in specific techniques for Parkinson's symptoms, 2) Analysis of patient behaviors and how it effects speech, 3) Modifying and shaping patient behavior, 4) Redirect patient for carryover, and 5) Assess progress and advance through program.  Therapy is focused on a restorative nature of the voice and vocal weaknesses associated in the decline as a result of Parkinson's Disease.     It is medically necessary for [name] [name] to recieve skilled speech therapy intervention in order to address the above impairments and functional limitations.      Patient presents with the following functional limitations:  Patient has difficulty with communicating Conversational/Spontaneous Speech.  Patient is unable to effectively provide breath support for sustained vowel sounds and audible volume for daily needs/wants, for the purpose of communicating in a safety event..  Patient is unable to effectively  communicate basic wants and needs.    Without intervention, patient is at risk for:  Decreased social interactions that may lead to further cognitive decline and contribute to further medical complications and increased fall risk.  Inability to properly support breath for physical activity and conversations which may contribute to stress and anxiety during these activities with patient feeling lack of air intake.    Patient is progressing well toward above mentioned functional goals.    PLAN:   It is medically necessary for [name] [name] to recieve skilled speech therapy intervention in order to address the above impairments and functional limitations.    Recommend continue per plan of care to work toward improving breath support in order to increase parameters of voice: duration, pitch range and volume in order to increase patient safety and independence at home, community, work by Scientist, physiological and intent effectively. and prevention of further functional decline of communication capabilities.    NEXT VISIT: Focus on hierarchy of sentences    Therapist Signature:    Wesley Carmack, MEd, Physicist, medical  Therapist  [facility]  Outpatient Specialty Rehabilitation   [email or web address]    08/09/2019

## 2019-08-10 ENCOUNTER — Encounter (INDEPENDENT_AMBULATORY_CARE_PROVIDER_SITE_OTHER): Payer: Self-pay | Admitting: Neurology

## 2019-08-10 ENCOUNTER — Ambulatory Visit: Payer: No Typology Code available for payment source

## 2019-08-10 DIAGNOSIS — R490 Dysphonia: Secondary | ICD-10-CM

## 2019-08-10 DIAGNOSIS — R498 Other voice and resonance disorders: Secondary | ICD-10-CM

## 2019-08-10 DIAGNOSIS — R1313 Dysphagia, pharyngeal phase: Secondary | ICD-10-CM

## 2019-08-10 NOTE — Progress Notes (Signed)
[facility]  [address], [address]  [place], [place]  [address]  Phone:  [phone]  Fax:  [phone]    SPEECH THERAPY DAILY TREATMENT NOTE    PATIENT: [name] [name] DOB: 08-18-1940   MR #: [id number]  AGE: 79 y.o.    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 Discussed and reviewed patient's medical history and personal risk factors and patient acknowledges exposure risk and wishes to proceed with care.    LKGMW10 Screen: Patient does not present with a temperature >/= 100.0, no new cough, no new throat pain, and no new shortness of breath.     Personal Protective Equipment Utilized:  Goggles and Procedure Mask    Medications:  has a current medication list which includes the following prescription(s): allopurinol, ascorbic acid, aspirin ec, atorvastatin, rytary, coq10, glucosamine-chondroitin, losartan, metformin, multiple vitamins-minerals, nitroglycerin, and sertraline.  Patient/Caregiver does not report changes to medication at this time.    Precautions:   None.    Allergies:  No Known Allergies    SUBJECTIVE REPORT:  Patient presents for speech therapy today alone.     Patient reports, "I feel better today."    Pain:    Patient denies pain today.    OBJECTIVE  FINDINGS:  Interventions / Goal Status:  Goals copied from last visit:  Short Term Goals:  To be met by 09/21/19  1. Increase vocal loudness to 80 dB to increase vocal respiratory support for functional communication.  2. Increase sustained vowel phonation to 15+ seconds to increase vocal respiratory support for functional communication.  3. Increase pitch range by >10 Hz higher and lower than baseline (140 Hz low, 300 Hz high).    Long Term Goals:  To be met by 11/03/19  1. Increase volume for functional phrases to 80 dB in order to increase safety in the home and community.  2. Increase vocal loudness to 75dB during reading increase respiratory support for functional communication.  3. Increase vocal loudness to >75 dB during conversational speech.  4. Pt will demonstrate independence in pharyngeal strengthening exercises to decrease risk of aspiration.      GOAL # INTERVENTIONS PROGRESS   STG 1 and 2 LSVT:  SLP utilized LSVT Companion software along with verbal and visual cues to promote vocal performance.  LSVT:  Patient was trained in LSVT home program promoting vocal strength and performance.  Sustained /a/: 81.8 dB; 18.8 seconds (* 10/15)   STG 3 LSVT:  SLP utilized LSVT Companion software along with verbal and visual cues to promote vocal performance.  LSVT:  Patient was trained in LSVT home program promoting vocal strength and performance.  Hi /a/: 83.2 dB; 288.4 Hz (* 4/15)  Low /a/:  74.9 dB; 163.7 Hz (* 0/15)    LTG 1 LSVT:  SLP utilized LSVT Companion software along with verbal and visual cues to promote vocal performance.  LSVT:  Patient was trained in LSVT home program promoting vocal strength and performance.  Functional phrases: 77.6 dB (* 0/5)   LTG 2 LSVT:  SLP utilized LSVT Companion software along with verbal and visual cues to promote vocal performance.  LSVT:  Patient was trained in LSVT home program promoting vocal strength and performance.  phrases, 79.8 dB       Education/Home Exercise  Program:    Patient was present in the session and was an active participant.  LSVT HEP issued: 6 Loud/long "ah", 6 high-pitched "ah", 6 low-pitched "ah", once through functional phrases and sheet in packet titled "Phrases for Speech Exercises". Pt to take note to use exaggerated facial movements and voice inflections during all phrases. Pt to take full diaphragmatic breaths prior to each.    ASSESSMENT:: [name] [name] is a 79 y.o. 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    Severe speech disturbance c/b poor breath support, limited pitch range and low volume for voice, leaving patient with approximately 50% intelligibility to an unfamiliar listener and in a noisy environment.  His voice poses a severe risk for effective communication in the event of an emergency and has negatively impacted hisability to communicate in His daily environment.  His decreased volume affects his ability to communicate.  Skilled services will focus on 1) Training in specific techniques for Parkinson's symptoms, 2) Analysis of patient behaviors and how it effects speech, 3) Modifying and shaping patient behavior, 4) Redirect patient for carryover, and 5) Assess progress and advance through program.  Therapy is focused on a restorative nature of the voice and vocal weaknesses associated in the decline as a result of Parkinson's Disease.     It is medically necessary for [name] [name] to recieve skilled speech therapy intervention in order to address the above impairments and functional limitations.      Patient presents with the following functional limitations:  Patient has difficulty with communicating Conversational/Spontaneous Speech.  Patient is unable to effectively provide breath support for sustained vowel sounds and audible volume for daily needs/wants, for the purpose of communicating in a safety event..  Patient is unable to effectively  communicate basic wants and needs.    Without intervention, patient is at risk for:  Decreased social interactions that may lead to further cognitive decline and contribute to further medical complications and increased fall risk.  Inability to properly support breath for physical activity and conversations which may contribute to stress and anxiety during these activities with patient feeling lack of air intake.    Patient is progressing well toward above mentioned functional goals.    PLAN:   It is medically necessary for [name] [name] to recieve skilled speech therapy intervention in order to address the above impairments and functional limitations.    Recommend continue per plan of care to work toward improving breath support in order to increase parameters of voice: duration, pitch range and volume in order to increase patient safety and independence at home, community, work by Scientist, physiological and intent effectively. and prevention of further functional decline of communication capabilities.    NEXT VISIT: Focus on hierarchy of sentences    Therapist Signature:    Sherine Cortese, MEd, Engineer, agricultural  [facility]  Outpatient Specialty Rehabilitation   [email or web address]    08/10/2019

## 2019-08-14 ENCOUNTER — Ambulatory Visit: Payer: No Typology Code available for payment source

## 2019-08-14 DIAGNOSIS — R1313 Dysphagia, pharyngeal phase: Secondary | ICD-10-CM

## 2019-08-14 DIAGNOSIS — R490 Dysphonia: Secondary | ICD-10-CM

## 2019-08-14 DIAGNOSIS — R498 Other voice and resonance disorders: Secondary | ICD-10-CM

## 2019-08-14 NOTE — Progress Notes (Signed)
[facility]  [address]  [place], [place]  [address]  Phone:  [phone]  Fax:  [phone]    SPEECH THERAPY DAILY TREATMENT NOTE    PATIENT: [name] [name] DOB: 1940-05-27   MR #: [id number]  AGE: 79 y.o.    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 Discussed and reviewed patient's medical history and personal risk factors and patient acknowledges exposure risk and wishes to proceed with care.    LKGMW10 Screen: Patient does not present with a temperature >/= 100.0, no new cough, no new throat pain, and no new shortness of breath.     Personal Protective Equipment Utilized:  Goggles and Procedure Mask    Medications:  has a current medication list which includes the following prescription(s): allopurinol, ascorbic acid, aspirin ec, atorvastatin, rytary, coq10, glucosamine-chondroitin, losartan, metformin, multiple vitamins-minerals, nitroglycerin, and sertraline.  Patient/Caregiver does not report changes to medication at this time.    Precautions:   None.    Allergies:  No Known Allergies    SUBJECTIVE REPORT:  Patient presents for speech therapy today alone.     Patient reports, "To be honest, I didn't do that well over the weekend (completing homework tasks)."    Pain:     Patient denies pain today.    OBJECTIVE FINDINGS:  Interventions / Goal Status:  Goals copied from last visit:  Short Term Goals:  To be met by 09/21/19  1. Increase vocal loudness to 80 dB to increase vocal respiratory support for functional communication.  2. Increase sustained vowel phonation to 15+ seconds to increase vocal respiratory support for functional communication.  3. Increase pitch range by >10 Hz higher and lower than baseline (140 Hz low, 300 Hz high).    Long Term Goals:  To be met by 11/03/19  1. Increase volume for functional phrases to 80 dB in order to increase safety in the home and community.  2. Increase vocal loudness to 75dB during reading increase respiratory support for functional communication.  3. Increase vocal loudness to >75 dB during conversational speech.  4. Pt will demonstrate independence in pharyngeal strengthening exercises to decrease risk of aspiration.      GOAL # INTERVENTIONS PROGRESS   STG 1 and 2 LSVT:  SLP utilized LSVT Companion software along with verbal and visual cues to promote vocal performance.  LSVT:  Patient was trained in LSVT home program promoting vocal strength and performance.  Sustained /a/:  70.1 dB; 19.2 seconds (* 0/15)   STG 3 LSVT:  SLP utilized LSVT Companion software along with verbal and visual cues to promote vocal performance.  LSVT:  Patient was trained in LSVT home program promoting vocal strength and performance.  Hi /a/: 71.9 dB; 294.1 Hz (* 0/15)  Low /a/:  64.8 dB; 169 Hz (* 0/15)    LTG 1 LSVT:  SLP utilized LSVT Companion software along with verbal and visual cues to promote vocal performance.  LSVT:  Patient was trained in LSVT home program promoting vocal strength and performance.  Functional phrases: 66 dB (* 0/5)   LTG 2 LSVT:  SLP utilized LSVT Companion software along with verbal and visual cues to promote vocal performance.  LSVT:  Patient was trained in LSVT home program promoting vocal strength and performance.  Sentences,68.4  dB       Education/Home Exercise Program:    Patient was present in the session and was an active participant.  LSVT HEP issued: 6 Loud/long "ah", 6 high-pitched "ah", 6 low-pitched "ah", once through functional phrases and sheet in packet titled "Sentences (15) for Speech Exercises". Pt to take note to use exaggerated facial movements and voice inflections during all phrases. Pt to take full diaphragmatic breaths prior to each.  SLP stressed importance of compliance and carryover of HEP to ensure progress toward meeting goals.    ASSESSMENT:: [name] [name] is a 79 y.o. 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    Severe speech disturbance c/b poor breath support, limited pitch range and low volume for voice, leaving patient with approximately 50% intelligibility to an unfamiliar listener and in a noisy environment.  His voice poses a severe risk for effective communication in the event of an emergency and has negatively impacted hisability to communicate in His daily environment.  His decreased volume affects his ability to communicate.  Skilled services will focus on 1) Training in specific techniques for Parkinson's symptoms, 2) Analysis of patient behaviors and how it effects speech, 3) Modifying and shaping patient behavior, 4) Redirect patient for carryover, and 5) Assess progress and advance through program.  Therapy is focused on a restorative nature of the voice and vocal weaknesses associated in the decline as a result of Parkinson's Disease.     It is medically necessary for [name] [name] to recieve skilled speech therapy intervention in order to address the above impairments and functional limitations.      Patient presents with the following functional limitations:  Patient has difficulty with communicating Conversational/Spontaneous Speech.  Patient is unable to effectively provide breath support for sustained  vowel sounds and audible volume for daily needs/wants, for the purpose of communicating in a safety event..  Patient is unable to effectively communicate basic wants and needs.    Without intervention, patient is at risk for:  Decreased social interactions that may lead to further cognitive decline and contribute to further medical complications and increased fall risk.  Inability to properly support breath for physical activity and conversations which may contribute to stress and anxiety during these activities with patient feeling lack of air intake.    Patient is progressing well toward above mentioned functional goals.  Pt demonstrated decline today without use of carryover.    PLAN:   It is medically necessary for [name] [name] to recieve skilled speech therapy intervention in order to address the above impairments and functional limitations.    Recommend continue per plan of care to work toward improving breath support in order to increase parameters of voice: duration, pitch range and volume in order to increase patient safety and independence at home, community, work by Scientist, physiological and intent effectively. and  prevention of further functional decline of communication capabilities.    NEXT VISIT: Focus on hierarchy of sentences    Therapist Signature:    Loza Prell, MEd, Physicist, medical Therapist  [facility]  Outpatient Specialty Rehabilitation   [email or web address]    08/14/2019

## 2019-08-15 ENCOUNTER — Ambulatory Visit: Payer: No Typology Code available for payment source

## 2019-08-15 DIAGNOSIS — R498 Other voice and resonance disorders: Secondary | ICD-10-CM

## 2019-08-15 DIAGNOSIS — R490 Dysphonia: Secondary | ICD-10-CM

## 2019-08-15 DIAGNOSIS — R1313 Dysphagia, pharyngeal phase: Secondary | ICD-10-CM

## 2019-08-15 NOTE — Progress Notes (Signed)
[facility]  [address], [address]  [place], [place]  [address]  Phone:  [phone]  Fax:  [phone]    SPEECH THERAPY DAILY TREATMENT NOTE    PATIENT: [name] [name] DOB: [birth year]   MR #: [id number]  AGE: 79 y.o.    FACILITY PROVIDER #: [id number] PRIMARY MD: [name], MD    CERTIFICATION DATES:     SLP Received On: 08/03/19 to 11/03/19 START OF CARE:  SLP Received On: 08/03/19   DIAGNOSES:  Parkinson's disease [G20]  TREATMENT DIAGNOSIS: Neurological Voice Impairment - Dysphonia R49.0 Hypophonia R49.8        Date of Service SLP Received On: 08/15/19   Treatment Time Start Time: 1000 to Stop Time: 1100   Time Calculation Time Calculation (min): 60 min   Visit # SLP Visit Number: 7   Units Billed   SLP Treatments  $ SLP Treatment Indiv [id number]): 1 Procedure     Education and Infection Prevention Regarding Covid-19 Pandemic:  Patient was educated on [facility]'s Policy and Procedure regarding COVID-19 care and safety precautions. Discussed and reviewed patient's medical history and personal risk factors and patient acknowledges exposure risk and wishes to proceed with care.    [id number] Screen: Patient does not present with a temperature >/= 100.0, no new cough, no new throat pain, and no new shortness of breath.     Personal Protective Equipment Utilized:  Goggles and Procedure Mask    Medications:  has a current medication list which includes the following prescription(s): allopurinol, ascorbic acid, aspirin ec, atorvastatin, rytary, coq10, glucosamine-chondroitin, losartan, metformin, multiple vitamins-minerals, nitroglycerin, and sertraline.  Patient/Caregiver does not report changes to medication at this time.    Precautions:   None.    Allergies:  No Known Allergies    SUBJECTIVE REPORT:  Patient presents for speech therapy today alone.     Patient reports, "I could hold a glass of milk and produce butter."  Pt feels that his tremors have increased  since the med increase by neurologist.  He has a f/u appt tomorrow.  SLP discussed greater challenges in voice exercises this week, as well, as a possible side effect.    Pain:    Patient denies pain today.    OBJECTIVE FINDINGS:  Interventions / Goal Status:  Goals copied from last visit:  Short Term Goals:  To be met by 09/21/19  1. Increase vocal loudness to 80 dB to increase vocal respiratory support for functional communication.  2. Increase sustained vowel phonation to 15+ seconds to increase vocal respiratory support for functional communication.  3. Increase pitch range by >10 Hz higher and lower than baseline (140 Hz low, 300 Hz high).    Long Term Goals:  To be met by 11/03/19  1. Increase volume for functional phrases to 80 dB in order to increase safety in the home and community.  2. Increase vocal loudness to 75dB during reading increase respiratory support for functional communication.  3. Increase vocal loudness to >75 dB during conversational speech.  4. Pt will demonstrate independence in pharyngeal strengthening exercises to decrease risk of aspiration.      GOAL # INTERVENTIONS PROGRESS   STG 1 and 2 LSVT:  SLP utilized LSVT Companion software along with verbal and visual cues to promote vocal performance.  LSVT:  Patient was trained in LSVT home program promoting vocal strength and performance.  Sustained /a/:  71.6 dB; 20.1 seconds (* 0/15)   STG  3 LSVT:  SLP utilized Landscape architect along with verbal and visual cues to promote vocal performance.  LSVT:  Patient was trained in LSVT home program promoting vocal strength and performance.  Hi /a/: 73.4 dB; 301.7 Hz (* 0/15)  Low /a/:  65.9 dB; 156.1 Hz (* 0/15)    LTG 1 LSVT:  SLP utilized LSVT Companion software along with verbal and visual cues to promote vocal performance.  LSVT:  Patient was trained in LSVT home program promoting vocal strength and performance.  Functional phrases: 69.1 dB (* 0/5)   LTG 2 LSVT:  SLP utilized LSVT  Companion software along with verbal and visual cues to promote vocal performance.  LSVT:  Patient was trained in LSVT home program promoting vocal strength and performance.  Sentences,69.9 dB       Education/Home Exercise Program:    Patient was present in the session and was an active participant.  LSVT HEP issued: 6 Loud/long "ah", 6 high-pitched "ah", 6 low-pitched "ah", once through functional phrases and sheet in packet titled "Sentences (15) for Speech Exercises". Pt to take note to use exaggerated facial movements and voice inflections during all phrases. Pt to take full diaphragmatic breaths prior to each.  SLP stressed importance of compliance and carryover of HEP to ensure progress toward meeting goals.    ASSESSMENT:: [name] [name] is a 79 y.o. 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    Severe speech disturbance c/b poor breath support, limited pitch range and low volume for voice, leaving patient with approximately 50% intelligibility to an unfamiliar listener and in a noisy environment.  His voice poses a severe risk for effective communication in the event of an emergency and has negatively impacted hisability to communicate in His daily environment.  His decreased volume affects his ability to communicate.  Skilled services will focus on 1) Training in specific techniques for Parkinson's symptoms, 2) Analysis of patient behaviors and how it effects speech, 3) Modifying and shaping patient behavior, 4) Redirect patient for carryover, and 5) Assess progress and advance through program.  Therapy is focused on a restorative nature of the voice and vocal weaknesses associated in the decline as a result of Parkinson's Disease.     It is medically necessary for [name] [name] to recieve skilled speech therapy intervention in order to address the above impairments and functional limitations.      Patient presents  with the following functional limitations:  Patient has difficulty with communicating Conversational/Spontaneous Speech.  Patient is unable to effectively provide breath support for sustained vowel sounds and audible volume for daily needs/wants, for the purpose of communicating in a safety event..  Patient is unable to effectively communicate basic wants and needs.    Without intervention, patient is at risk for:  Decreased social interactions that may lead to further cognitive decline and contribute to further medical complications and increased fall risk.  Inability to properly support breath for physical activity and conversations which may contribute to stress and anxiety during these activities with patient feeling lack of air intake.    Patient is progressing well toward above mentioned functional goals.  Pt demonstrated decline today without use of carryover.    PLAN:   It is medically necessary for [name] [name] to recieve skilled speech therapy intervention in order to address the above impairments and functional limitations.    Recommend continue per plan of care to work toward  improving breath support in order to increase parameters of voice: duration, pitch range and volume in order to increase patient safety and independence at home, community, work by communicate safety and intent effectively. and prevention of further functional decline of communication capabilities.    NEXT VISIT: Focus on hierarchy of sentences    Therapist Signature:    Zabella Wease, MEd, Physicist, medical Therapist  [facility]  Outpatient Specialty Rehabilitation   [email or web address]    08/15/2019

## 2019-08-16 ENCOUNTER — Encounter (INDEPENDENT_AMBULATORY_CARE_PROVIDER_SITE_OTHER): Payer: Self-pay | Admitting: Family Medicine

## 2019-08-16 ENCOUNTER — Telehealth (INDEPENDENT_AMBULATORY_CARE_PROVIDER_SITE_OTHER): Payer: No Typology Code available for payment source | Admitting: Family Medicine

## 2019-08-16 ENCOUNTER — Encounter (INDEPENDENT_AMBULATORY_CARE_PROVIDER_SITE_OTHER): Payer: Self-pay

## 2019-08-16 DIAGNOSIS — E1169 Type 2 diabetes mellitus with other specified complication: Secondary | ICD-10-CM

## 2019-08-16 DIAGNOSIS — I10 Essential (primary) hypertension: Secondary | ICD-10-CM

## 2019-08-16 DIAGNOSIS — R350 Frequency of micturition: Secondary | ICD-10-CM

## 2019-08-16 DIAGNOSIS — E782 Mixed hyperlipidemia: Secondary | ICD-10-CM

## 2019-08-16 MED ORDER — LOSARTAN POTASSIUM 25 MG PO TABS
ORAL_TABLET | ORAL | 4 refills | Status: AC
Start: 2019-08-16 — End: ?

## 2019-08-16 MED ORDER — ATORVASTATIN CALCIUM 20 MG PO TABS
ORAL_TABLET | ORAL | 4 refills | Status: DC
Start: 2019-08-16 — End: 2019-11-01

## 2019-08-16 NOTE — Progress Notes (Signed)
[facility] [place] - AN  PARTNER                       Date of Exam: 08/16/2019        Patient ID: [name] [name] is a 79 y.o. male.  Attending Physician: [name], MD          Chief Complaint:    Chief Complaint   Patient presents with   . Follow-up     meds refill   VIDEO VISIT, patient is known to provider, patient is home in [place]            HPI:    PT message :    Hi Dr. Gunnar Bulla,   Sorry we missed each other for this mornings appointment I connected but was listed as the only called. Not a big deal everything is going well. I did want to ask you if we can do anything for my frequent urination at night? It has been bugging me as I am up 5+ times a night. Please let me know if you want me to reschedule.   Thanks  -Onalee Hua      1. Urinary frequency: no burning with urination, good flow, towards the end it sprays, No hx of prostate issues, no hx of urinary tract infections.  He has normal urine quantity.        HTN controlled ranges in 120/80s, DM is controlled last a1c 4.9 off glipizide on metformin alone.              Problem List:    Patient Active Problem List   Diagnosis   . Parkinson's disease   . Diabetes mellitus type 2, controlled   . Mild episode of recurrent major depressive disorder   . Neutrophilic leukocytosis   . Low back pain   . Hypertension   . Hepatic cyst   . Dyslipidemia   . Gouty arthropathy   . Diffuse esophageal spasm   . Diabetic nephropathy associated with type 2 diabetes mellitus   . Depression   . CAD (coronary artery disease)   . Anosmia   . Benign essential hypertension   . Angina pectoris   . Nocturia   . Optic neuritis   . Other and unspecified hyperlipidemia   . Pulmonary nodule, left   . S/P CABG (coronary artery bypass graft)   . Shoulder joint pain   . Tremor   . Ureteric stone             Current Meds:    Current Outpatient Medications on File Prior to Visit   Medication Sig Dispense Refill   . allopurinol (ZYLOPRIM) 300 MG tablet TAKE 1  TABLET BY MOUTH EVERY DAY 30 tablet 0   . Ascorbic Acid (VITAMIN C PO) Take 1,000 mg by mouth     . aspirin EC 81 MG EC tablet Take by mouth     . carbidopa-levodopa (Rytary) 36.25-145 MG Cap CR per capsule Take 2 capsules by mouth 4 (four) times daily 5 hours apart. 720 capsule 3   . Coenzyme Q10 (CoQ10) 200 MG Cap Take 400 mg by mouth     . glucosamine-chondroitin 500-400 MG tablet Take 1 tablet by mouth     . Multiple Vitamins-Minerals (CENTRUM SILVER 50+MEN PO) Take 1 tablet by mouth     . nitroglycerin (NITROSTAT) 0.4 MG SL tablet Place 0.4 mg under the tongue     . [DISCONTINUED] atorvastatin (LIPITOR) 20  MG tablet TAKE 1 TABLET BY MOUTH EVERYDAY AT BEDTIME 30 tablet 0   . [DISCONTINUED] losartan (COZAAR) 25 MG tablet TAKE 1 TABLET BY MOUTH EVERY DAY 30 tablet 0   . [DISCONTINUED] metFORMIN (GLUCOPHAGE) 1000 MG tablet TAKE 1 TABLET (1,000 MG TOTAL) BY MOUTH 2 TIMES DAILY WITH MEALS. 60 tablet 0   . sertraline (ZOLOFT) 25 MG tablet TAKE 1 TABLET BY MOUTH EVERY DAY 90 tablet 1     No current facility-administered medications on file prior to visit.          Allergies:    No Known Allergies          Past Surgical History:    History reviewed. No pertinent surgical history.        Family History:    History reviewed. No pertinent family history.        Social History:    Social History     Tobacco Use   . Smoking status: Never Smoker   . Smokeless tobacco: Never Used   Vaping Use   . Vaping Use: Never used   Substance Use Topics   . Alcohol use: Not Currently   . Drug use: Never                 Vital Signs:    There were no vitals taken for this visit.         ROS:    Review of Systems   Constitutional: Negative for activity change and appetite change.   HENT: Negative for ear discharge and ear pain.    Eyes: Negative for discharge.   Respiratory: Negative for apnea and shortness of breath.    Cardiovascular: Negative for chest pain.   Gastrointestinal: Negative for abdominal distention.   Endocrine: Negative for  cold intolerance.   Genitourinary: Negative for difficulty urinating.   Musculoskeletal: Negative for arthralgias.              Physical Exam:    Physical Exam  Vitals reviewed.   Constitutional:       Appearance: Normal appearance.   HENT:      Head: Normocephalic.      Nose: Nose normal.   Pulmonary:      Effort: Pulmonary effort is normal.      Comments: Speaking Full sentence  Neurological:      Mental Status: He is alert.   Psychiatric:         Mood and Affect: Mood normal.         Behavior: Behavior normal.              Assessment:      1. Mixed hyperlipidemia  - atorvastatin (LIPITOR) 20 MG tablet; TAKE 1 TABLET BY MOUTH EVERYDAY AT BEDTIME  Dispense: 90 tablet; Refill: 4    2. Essential hypertension  - Comprehensive metabolic panel; Future  - losartan (COZAAR) 25 MG tablet; TAKE 1 TABLET BY MOUTH EVERY DAY  Dispense: 90 tablet; Refill: 4    3. Controlled type 2 diabetes mellitus with other specified complication, without long-term current use of insulin  - Hemoglobin A1C; Future  - Urine Microalbumin Random; Future    4. Urinary frequency  - Urology Referral: Kevan Rosebush, MD [facility]); Future              Plan:        STOP metformin   Check sugars weekly    Urinary symptoms not consistent with bph nor oab would like him to  see urology before starting meds.   F/u 6 weeks         Follow-up:    Return in about 6 weeks (around 09/27/2019).           [name], MD

## 2019-08-17 NOTE — Progress Notes (Signed)
LVM to schedule follow up any time in Sept, 08/17/2019 -SZ

## 2019-08-21 ENCOUNTER — Ambulatory Visit: Payer: No Typology Code available for payment source

## 2019-08-22 ENCOUNTER — Ambulatory Visit: Payer: No Typology Code available for payment source

## 2019-08-22 DIAGNOSIS — R490 Dysphonia: Secondary | ICD-10-CM

## 2019-08-22 DIAGNOSIS — R498 Other voice and resonance disorders: Secondary | ICD-10-CM

## 2019-08-23 ENCOUNTER — Ambulatory Visit: Payer: No Typology Code available for payment source

## 2019-08-23 DIAGNOSIS — R490 Dysphonia: Secondary | ICD-10-CM

## 2019-08-23 DIAGNOSIS — R498 Other voice and resonance disorders: Secondary | ICD-10-CM

## 2019-08-23 NOTE — Progress Notes (Signed)
[facility]  [address], [address]  [place], [place]  [address]  Phone:  [phone]  Fax:  [phone]    SPEECH THERAPY DAILY TREATMENT NOTE    PATIENT: [name] [name] DOB: [birth year]   MR #: [id number]  AGE: 79 y.o.    FACILITY PROVIDER #: [id number] PRIMARY MD: [name], MD    CERTIFICATION DATES:     SLP Received On: 08/03/19 to 11/03/19 START OF CARE:  SLP Received On: 08/03/19   DIAGNOSES:  Parkinson's disease [G20]  TREATMENT DIAGNOSIS: Neurological Voice Impairment - Dysphonia R49.0 Hypophonia R49.8        Date of Service SLP Received On: 08/23/19   Treatment Time Start Time: 1000 to Stop Time: 1100   Time Calculation Time Calculation (min): 60 min   Visit # SLP Visit Number: 8   Units Billed   SLP Treatments  $ SLP Treatment Indiv [phone]): 1 Procedure     Education and Infection Prevention Regarding Covid-19 Pandemic:  Patient was educated on [facility]'s Policy and Procedure regarding COVID-19 care and safety precautions. Discussed and reviewed patient's medical history and personal risk factors and patient acknowledges exposure risk and wishes to proceed with care.    [id number] Screen: Patient does not present with a temperature >/= 100.0, no new cough, no new throat pain, and no new shortness of breath.     Personal Protective Equipment Utilized:  Goggles and Procedure Mask    Medications:  has a current medication list which includes the following prescription(s): allopurinol, ascorbic acid, aspirin ec, atorvastatin, rytary, coq10, glucosamine-chondroitin, losartan, multiple vitamins-minerals, nitroglycerin, and sertraline.  Patient/Caregiver does report changes to medication at this time.  See below    Precautions:   None.    Allergies:  No Known Allergies    SUBJECTIVE REPORT:  Patient presents for speech therapy today alone.     Patient reports no change to his medication for PD.  His neurologist told him to give it a little while, but  lowered his BP medication.    Pain:    Patient denies pain today.    OBJECTIVE FINDINGS:  Interventions / Goal Status:  Goals copied from last visit:  Short Term Goals:  To be met by 09/21/19  1. Increase vocal loudness to 80 dB to increase vocal respiratory support for functional communication.  2. Increase sustained vowel phonation to 15+ seconds to increase vocal respiratory support for functional communication.  3. Increase pitch range by >10 Hz higher and lower than baseline (140 Hz low, 300 Hz high).    Long Term Goals:  To be met by 11/03/19  1. Increase volume for functional phrases to 80 dB in order to increase safety in the home and community.  2. Increase vocal loudness to 75dB during reading increase respiratory support for functional communication.  3. Increase vocal loudness to >75 dB during conversational speech.  4. Pt will demonstrate independence in pharyngeal strengthening exercises to decrease risk of aspiration.      GOAL # INTERVENTIONS PROGRESS   STG 1 and 2 LSVT:  SLP utilized LSVT Companion software along with verbal and visual cues to promote vocal performance.  LSVT:  Patient was trained in LSVT home program promoting vocal strength and performance.  Sustained /a/:  82.3 dB; 19.1 seconds (* 12/15)   STG 3 LSVT:  SLP utilized LSVT Companion software along with verbal and visual cues to promote vocal performance.  LSVT:  Patient was trained  in LSVT home program promoting vocal strength and performance.  Hi /a/: 81.4 dB; 310.5 Hz (* 12/15)  Low /a/:  70.8 dB; 145.2 Hz (* 0/15)    LTG 1 LSVT:  SLP utilized LSVT Companion software along with verbal and visual cues to promote vocal performance.  LSVT:  Patient was trained in LSVT home program promoting vocal strength and performance.  Functional phrases: 76.7 dB (* 0/5)   LTG 2 LSVT:  SLP utilized LSVT Companion software along with verbal and visual cues to promote vocal performance.  LSVT:  Patient was trained in LSVT home program promoting vocal  strength and performance.  Sentences, 75.9 dB       Education/Home Exercise Program:    Patient was present in the session and was an active participant.  LSVT HEP issued: 6 Loud/long "ah", 6 high-pitched "ah", 6 low-pitched "ah", once through functional phrases and sheet in packet titled "Sentences (15) for Speech Exercises". Pt to take note to use exaggerated facial movements and voice inflections during all phrases. Pt to take full diaphragmatic breaths prior to each.       ASSESSMENT:: [name] is a 79 y.o. 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    Severe speech disturbance c/b poor breath support, limited pitch range and low volume for voice, leaving patient with approximately 50% intelligibility to an unfamiliar listener and in a noisy environment.  His voice poses a severe risk for effective communication in the event of an emergency and has negatively impacted hisability to communicate in His daily environment.  His decreased volume affects his ability to communicate.  Skilled services will focus on 1) Training in specific techniques for Parkinson's symptoms, 2) Analysis of patient behaviors and how it effects speech, 3) Modifying and shaping patient behavior, 4) Redirect patient for carryover, and 5) Assess progress and advance through program.  Therapy is focused on a restorative nature of the voice and vocal weaknesses associated in the decline as a result of Parkinson's Disease.     It is medically necessary for Ostin Mathey to recieve skilled speech therapy intervention in order to address the above impairments and functional limitations.      Patient presents with the following functional limitations:  Patient has difficulty with communicating Conversational/Spontaneous Speech.  Patient is unable to effectively provide breath support for sustained vowel sounds and audible volume for daily needs/wants,  for the purpose of communicating in a safety event..  Patient is unable to effectively communicate basic wants and needs.    Without intervention, patient is at risk for:  Decreased social interactions that may lead to further cognitive decline and contribute to further medical complications and increased fall risk.  Inability to properly support breath for physical activity and conversations which may contribute to stress and anxiety during these activities with patient feeling lack of air intake.    Patient is progressing well toward above mentioned functional goals.  Pt demonstrated decline today without use of carryover.    PLAN:   It is medically necessary for Praveen Coia to recieve skilled speech therapy intervention in order to address the above impairments and functional limitations.    Recommend continue per plan of care to work toward improving breath support in order to increase parameters of voice: duration, pitch range and volume in order to increase patient safety and independence at home, community, work by Scientist, physiological and intent effectively. and prevention of  further functional decline of communication capabilities.    NEXT VISIT: Focus on hierarchy of sentences    Therapist Signature:    Oaklen Thiam, MEd, Physicist, medical Therapist  [facility]  Outpatient Specialty Rehabilitation   [email or web address]    08/23/2019

## 2019-08-24 ENCOUNTER — Ambulatory Visit: Payer: No Typology Code available for payment source

## 2019-08-24 DIAGNOSIS — R498 Other voice and resonance disorders: Secondary | ICD-10-CM

## 2019-08-24 DIAGNOSIS — R490 Dysphonia: Secondary | ICD-10-CM

## 2019-08-25 ENCOUNTER — Other Ambulatory Visit (INDEPENDENT_AMBULATORY_CARE_PROVIDER_SITE_OTHER): Payer: Self-pay | Admitting: Family Medicine

## 2019-08-25 ENCOUNTER — Encounter (INDEPENDENT_AMBULATORY_CARE_PROVIDER_SITE_OTHER): Payer: Self-pay

## 2019-08-25 DIAGNOSIS — M109 Gout, unspecified: Secondary | ICD-10-CM

## 2019-08-28 ENCOUNTER — Encounter (INDEPENDENT_AMBULATORY_CARE_PROVIDER_SITE_OTHER): Payer: Self-pay | Admitting: Nurse Practitioner

## 2019-08-28 ENCOUNTER — Ambulatory Visit: Payer: No Typology Code available for payment source | Attending: Family Medicine

## 2019-08-28 DIAGNOSIS — R279 Unspecified lack of coordination: Secondary | ICD-10-CM | POA: Insufficient documentation

## 2019-08-28 DIAGNOSIS — R498 Other voice and resonance disorders: Secondary | ICD-10-CM | POA: Insufficient documentation

## 2019-08-28 DIAGNOSIS — G20 Parkinson's disease: Secondary | ICD-10-CM | POA: Insufficient documentation

## 2019-08-28 DIAGNOSIS — R269 Unspecified abnormalities of gait and mobility: Secondary | ICD-10-CM | POA: Insufficient documentation

## 2019-08-28 DIAGNOSIS — R490 Dysphonia: Secondary | ICD-10-CM | POA: Insufficient documentation

## 2019-08-28 DIAGNOSIS — M6289 Other specified disorders of muscle: Secondary | ICD-10-CM | POA: Insufficient documentation

## 2019-08-28 NOTE — Progress Notes (Signed)
[facility]  [address], [address]  [place], [place]  [address]  Phone:  [phone]  Fax:  [phone]    SPEECH THERAPY DAILY TREATMENT NOTE    PATIENT: [name] [name] DOB: Oct 24, 1940   MR #: [id number]  AGE: 79 y.o.    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 Discussed and reviewed patient's medical history and personal risk factors and patient acknowledges exposure risk and wishes to proceed with care.    [id number] Screen: Patient does not present with a temperature >/= 100.0, no new cough, no new throat pain, and no new shortness of breath.     Personal Protective Equipment Utilized:  Goggles and Procedure Mask    Medications:  has a current medication list which includes the following prescription(s): allopurinol, ascorbic acid, aspirin ec, atorvastatin, rytary, coq10, glucosamine-chondroitin, losartan, multiple vitamins-minerals, nitroglycerin, and sertraline.  Patient/Caregiver does report changes to medication at this time.  See below    Precautions:   None.    Allergies:  No Known Allergies    SUBJECTIVE REPORT:  Patient presents for speech therapy today alone.     Patient reports, "Sometimes my memory just goes.  They told me to accept it."    Pain:    Patient denies pain  today.    OBJECTIVE FINDINGS:  Interventions / Goal Status:  Goals copied from last visit:  Short Term Goals:  To be met by 09/21/19  1. Increase vocal loudness to 80 dB to increase vocal respiratory support for functional communication.  2. Increase sustained vowel phonation to 15+ seconds to increase vocal respiratory support for functional communication.  3. Increase pitch range by >10 Hz higher and lower than baseline (140 Hz low, 300 Hz high).    Long Term Goals:  To be met by 11/03/19  1. Increase volume for functional phrases to 80 dB in order to increase safety in the home and community.  2. Increase vocal loudness to 75dB during reading increase respiratory support for functional communication.  3. Increase vocal loudness to >75 dB during conversational speech.  4. Pt will demonstrate independence in pharyngeal strengthening exercises to decrease risk of aspiration.      GOAL # INTERVENTIONS PROGRESS   STG 1 and 2 LSVT:  SLP utilized LSVT Companion software along with verbal and visual cues to promote vocal performance.  LSVT:  Patient was trained in LSVT home program promoting vocal strength and performance.  Sustained /a/:  80 dB; 17 seconds (* 12/15)   STG 3 LSVT:  SLP utilized LSVT Companion software along with verbal and visual cues to promote vocal performance.  LSVT:  Patient was trained in LSVT home program promoting vocal strength and performance.  Hi /  a/: 82.4 dB; 293.9 Hz (* 11/15)  Low /a/:  74.7 dB; 131.8 Hz (* 4/15)    LTG 1 LSVT:  SLP utilized LSVT Companion software along with verbal and visual cues to promote vocal performance.  LSVT:  Patient was trained in LSVT home program promoting vocal strength and performance.  Functional phrases: 76.5 dB (* 0/5)   LTG 2 LSVT:  SLP utilized LSVT Companion software along with verbal and visual cues to promote vocal performance.  LSVT:  Patient was trained in LSVT home program promoting vocal strength and performance.  Sentences, 75.3 dB        Education/Home Exercise Program:    Patient was present in the session and was an active participant.  LSVT HEP issued: 6 Loud/long "ah", 6 high-pitched "ah", 6 low-pitched "ah", once through functional phrases and sheet in packet titled "Sentences (15) for Speech Exercises". Pt to take note to use exaggerated facial movements and voice inflections during all phrases. Pt to take full diaphragmatic breaths prior to each.       ASSESSMENT:: [name] [name] is a 79 y.o. 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    Severe speech disturbance c/b poor breath support, limited pitch range and low volume for voice, leaving patient with approximately 50% intelligibility to an unfamiliar listener and in a noisy environment.  His voice poses a severe risk for effective communication in the event of an emergency and has negatively impacted hisability to communicate in His daily environment.  His decreased volume affects his ability to communicate.  Skilled services will focus on 1) Training in specific techniques for Parkinson's symptoms, 2) Analysis of patient behaviors and how it effects speech, 3) Modifying and shaping patient behavior, 4) Redirect patient for carryover, and 5) Assess progress and advance through program.  Therapy is focused on a restorative nature of the voice and vocal weaknesses associated in the decline as a result of Parkinson's Disease.     It is medically necessary for [name] [name] to recieve skilled speech therapy intervention in order to address the above impairments and functional limitations.      Patient presents with the following functional limitations:  Patient has difficulty with communicating Conversational/Spontaneous Speech.  Patient is unable to effectively provide breath support for sustained vowel sounds and audible volume for daily needs/wants, for the purpose of communicating in a safety  event..  Patient is unable to effectively communicate basic wants and needs.    Without intervention, patient is at risk for:  Decreased social interactions that may lead to further cognitive decline and contribute to further medical complications and increased fall risk.  Inability to properly support breath for physical activity and conversations which may contribute to stress and anxiety during these activities with patient feeling lack of air intake.    Patient is progressing well toward above mentioned functional goals.  Pt demonstrated decline today without use of carryover.    PLAN:   It is medically necessary for [name] [name] to recieve skilled speech therapy intervention in order to address the above impairments and functional limitations.    Recommend continue per plan of care to work toward improving breath support in order to increase parameters of voice: duration, pitch range and volume in order to increase patient safety and independence at home, community, work by Scientist, physiological and intent effectively. and prevention of further functional decline of communication capabilities.    NEXT VISIT:  Focus on hierarchy of paragraphs    Therapist Signature:    Charletta Voight, MEd, Physicist, medical Therapist  [facility]  [facility]   [email or web address]    08/28/2019

## 2019-08-28 NOTE — Progress Notes (Signed)
[facility]  [address], [address]  [place], [place]  [address]  Phone:  [phone]  Fax:  [phone]    SPEECH THERAPY DAILY TREATMENT NOTE    PATIENT: [name] [name] DOB: 16-Sep-1940   MR #: [id number]  AGE: 79 y.o.    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 Discussed and reviewed patient's medical history and personal risk factors and patient acknowledges exposure risk and wishes to proceed with care.    [id number] Screen: Patient does not present with a temperature >/= 100.0, no new cough, no new throat pain, and no new shortness of breath.     Personal Protective Equipment Utilized:  Goggles and Procedure Mask    Medications:  has a current medication list which includes the following prescription(s): allopurinol, ascorbic acid, aspirin ec, atorvastatin, rytary, coq10, glucosamine-chondroitin, losartan, multiple vitamins-minerals, nitroglycerin, and sertraline.  Patient/Caregiver does report changes to medication at this time.  See below    Precautions:   None.    Allergies:  No Known Allergies    SUBJECTIVE REPORT:  Patient presents for speech therapy today alone.     Patient reports, "I can never remember the (voicemail) assignment."    Pain:    Patient denies pain  today.    OBJECTIVE FINDINGS:  Interventions / Goal Status:  Goals copied from last visit:  Short Term Goals:  To be met by 09/21/19  1. Increase vocal loudness to 80 dB to increase vocal respiratory support for functional communication.  2. Increase sustained vowel phonation to 15+ seconds to increase vocal respiratory support for functional communication.  3. Increase pitch range by >10 Hz higher and lower than baseline (140 Hz low, 300 Hz high).    Long Term Goals:  To be met by 11/03/19  1. Increase volume for functional phrases to 80 dB in order to increase safety in the home and community.  2. Increase vocal loudness to 75dB during reading increase respiratory support for functional communication.  3. Increase vocal loudness to >75 dB during conversational speech.  4. Pt will demonstrate independence in pharyngeal strengthening exercises to decrease risk of aspiration.      GOAL # INTERVENTIONS PROGRESS   STG 1 and 2 LSVT:  SLP utilized LSVT Companion software along with verbal and visual cues to promote vocal performance.  LSVT:  Patient was trained in LSVT home program promoting vocal strength and performance.  Sustained /a/:  79.5 dB; 14.4 seconds (* 2/15)   STG 3 LSVT:  SLP utilized LSVT Companion software along with verbal and visual cues to promote vocal performance.  LSVT:  Patient was trained in LSVT home program promoting vocal strength and performance.  Hi /a/: 81.8 dB; 319.1 Hz Marland Kitchen  0/15)  Low /a/:  76.7 dB; 154.6 Hz (* 0/15)    LTG 1 LSVT:  SLP utilized LSVT Companion software along with verbal and visual cues to promote vocal performance.  LSVT:  Patient was trained in LSVT home program promoting vocal strength and performance.  Functional phrases: 75.7 dB (* 0/5)   LTG 2 LSVT:  SLP utilized LSVT Companion software along with verbal and visual cues to promote vocal performance.  LSVT:  Patient was trained in LSVT home program promoting vocal strength and performance.  Paragraphs, 76.2 dB        Education/Home Exercise Program:    Patient was present in the session and was an active participant.  LSVT HEP issued: 6 Loud/long "ah", 6 high-pitched "ah", 6 low-pitched "ah", once through functional phrases and read aloud any paragraph length publication for 2 minutes. Pt to take note to use exaggerated facial movements and voice inflections during all phrases. Pt to take full diaphragmatic breaths between each phrase-length utterance.       ASSESSMENT:: [name] [name] is a 79 y.o. 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    Severe speech disturbance c/b poor breath support, limited pitch range and low volume for voice, leaving patient with approximately 50% intelligibility to an unfamiliar listener and in a noisy environment.  His voice poses a severe risk for effective communication in the event of an emergency and has negatively impacted hisability to communicate in His daily environment.  His decreased volume affects his ability to communicate.  Skilled services will focus on 1) Training in specific techniques for Parkinson's symptoms, 2) Analysis of patient behaviors and how it effects speech, 3) Modifying and shaping patient behavior, 4) Redirect patient for carryover, and 5) Assess progress and advance through program.  Therapy is focused on a restorative nature of the voice and vocal weaknesses associated in the decline as a result of Parkinson's Disease.     It is medically necessary for [name] [name] to recieve skilled speech therapy intervention in order to address the above impairments and functional limitations.      Patient presents with the following functional limitations:  Patient has difficulty with communicating Conversational/Spontaneous Speech.  Patient is unable to effectively provide breath support for sustained vowel sounds and audible volume for daily needs/wants, for the purpose of  communicating in a safety event..  Patient is unable to effectively communicate basic wants and needs.    Without intervention, patient is at risk for:  Decreased social interactions that may lead to further cognitive decline and contribute to further medical complications and increased fall risk.  Inability to properly support breath for physical activity and conversations which may contribute to stress and anxiety during these activities with patient feeling lack of air intake.    Patient is progressing well toward above mentioned functional goals.  Pt demonstrated decline today without use of carryover.    PLAN:   It is medically necessary for [name] [name] to recieve skilled speech therapy intervention in order to address the above impairments and functional limitations.    Recommend continue per plan of care to work toward improving breath support in order to increase parameters of voice: duration, pitch range and volume in order to increase patient safety and independence at home, community, work by Scientist, physiological and intent effectively. and prevention of further functional decline of communication capabilities.    NEXT VISIT: Focus on hierarchy of paragraphs  Therapist Signature:    Kendrea Cerritos, MEd, Physicist, medical Therapist  [facility]  Outpatient Specialty Rehabilitation   [email or web address]    08/28/2019

## 2019-08-29 ENCOUNTER — Ambulatory Visit: Payer: No Typology Code available for payment source

## 2019-08-29 DIAGNOSIS — R498 Other voice and resonance disorders: Secondary | ICD-10-CM

## 2019-08-29 DIAGNOSIS — R1313 Dysphagia, pharyngeal phase: Secondary | ICD-10-CM

## 2019-08-29 DIAGNOSIS — R490 Dysphonia: Secondary | ICD-10-CM

## 2019-08-29 NOTE — Progress Notes (Signed)
[facility]  [address]  [place], [place]  [address]  Phone:  [phone]  Fax:  [phone]    SPEECH THERAPY DAILY TREATMENT NOTE    PATIENT: [name] [name] DOB: Dec 26, 1940   MR #: [id number]  AGE: 79 y.o.    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 Discussed and reviewed patient's medical history and personal risk factors and patient acknowledges exposure risk and wishes to proceed with care.    [id number] Screen: Patient does not present with a temperature >/= 100.0, no new cough, no new throat pain, and no new shortness of breath.     Personal Protective Equipment Utilized:  Goggles and Procedure Mask    Medications:  has a current medication list which includes the following prescription(s): allopurinol, ascorbic acid, aspirin ec, atorvastatin, rytary, coq10, glucosamine-chondroitin, losartan, multiple vitamins-minerals, nitroglycerin, and sertraline.  Patient/Caregiver does report changes to medication at this time.  See below    Precautions:   None.    Allergies:  No Known Allergies    SUBJECTIVE REPORT:  Patient presents for speech therapy today alone.     Patient reports, "I know  You told me to call.  But, I couldn't remember why."    Pain:    Patient denies pain  today.    OBJECTIVE FINDINGS:  Interventions / Goal Status:  Goals copied from last visit:  Short Term Goals:  To be met by 09/21/19  1. Increase vocal loudness to 80 dB to increase vocal respiratory support for functional communication.  2. Increase sustained vowel phonation to 15+ seconds to increase vocal respiratory support for functional communication.  3. Increase pitch range by >10 Hz higher and lower than baseline (140 Hz low, 300 Hz high).    Long Term Goals:  To be met by 11/03/19  1. Increase volume for functional phrases to 80 dB in order to increase safety in the home and community.  2. Increase vocal loudness to 75dB during reading increase respiratory support for functional communication.  3. Increase vocal loudness to >75 dB during conversational speech.  4. Pt will demonstrate independence in pharyngeal strengthening exercises to decrease risk of aspiration.      GOAL # INTERVENTIONS PROGRESS   STG 1 and 2 LSVT:  SLP utilized LSVT Companion software along with verbal and visual cues to promote vocal performance.  LSVT:  Patient was trained in LSVT home program promoting vocal strength and performance.  Sustained /a/:  83.2 dB; 15.2 seconds (* 10/15)   STG 3 LSVT:  SLP utilized LSVT Companion software along with verbal and visual cues to promote vocal performance.  LSVT:  Patient was trained in LSVT home program promoting vocal strength and performance.  Hi /a/: 82.2 dB; 300.5 Hz (* 12/15)  Low /a/:  72.4 dB; 150.9 Hz (* 0/15)    LTG 1 LSVT:  SLP utilized LSVT Companion software along with verbal and visual cues to promote vocal performance.  LSVT:  Patient was trained in LSVT home program promoting vocal strength and performance.  Functional phrases: 76.3 dB (* 0/5)   LTG 2 LSVT:  SLP utilized LSVT Companion software along with verbal and visual cues to promote vocal performance.  LSVT:  Patient was trained in LSVT home program promoting vocal strength and performance.  Paragraphs, 75.8 dB        Education/Home Exercise Program:    Patient was present in the session and was an active participant.  LSVT HEP issued: 6 Loud/long "ah", 6 high-pitched "ah", 6 low-pitched "ah", once through functional phrases and read aloud any paragraph length publication for 2 minutes. Pt to take note to use exaggerated facial movements and voice inflections during all phrases. Pt to take full diaphragmatic breaths between each phrase-length utterance.       ASSESSMENT:: [name] [name] is a 79 y.o. 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    Severe speech disturbance c/b poor breath support, limited pitch range and low volume for voice, leaving patient with approximately 50% intelligibility to an unfamiliar listener and in a noisy environment.  His voice poses a severe risk for effective communication in the event of an emergency and has negatively impacted hisability to communicate in His daily environment.  His decreased volume affects his ability to communicate.  Skilled services will focus on 1) Training in specific techniques for Parkinson's symptoms, 2) Analysis of patient behaviors and how it effects speech, 3) Modifying and shaping patient behavior, 4) Redirect patient for carryover, and 5) Assess progress and advance through program.  Therapy is focused on a restorative nature of the voice and vocal weaknesses associated in the decline as a result of Parkinson's Disease.     It is medically necessary for [name] [name] to recieve skilled speech therapy intervention in order to address the above impairments and functional limitations.      Patient presents with the following functional limitations:  Patient has difficulty with communicating Conversational/Spontaneous Speech.  Patient is unable to effectively provide breath support for sustained vowel sounds and audible volume for daily needs/wants, for the purpose of  communicating in a safety event..  Patient is unable to effectively communicate basic wants and needs.    Without intervention, patient is at risk for:  Decreased social interactions that may lead to further cognitive decline and contribute to further medical complications and increased fall risk.  Inability to properly support breath for physical activity and conversations which may contribute to stress and anxiety during these activities with patient feeling lack of air intake.    Patient is progressing well toward above mentioned functional goals.  Pt demonstrated decline today without use of carryover.    PLAN:   It is medically necessary for [name] [name] to recieve skilled speech therapy intervention in order to address the above impairments and functional limitations.    Recommend continue per plan of care to work toward improving breath support in order to increase parameters of voice: duration, pitch range and volume in order to increase patient safety and independence at home, community, work by Scientist, physiological and intent effectively. and prevention of further functional decline of communication capabilities.  NEXT VISIT: Focus on hierarchy of paragraphs x 5 min    Therapist Signature:    Nanette Wirsing, MEd, Physicist, medical Therapist  [facility]  Outpatient Specialty Rehabilitation   [email or web address]    08/29/2019

## 2019-08-29 NOTE — Progress Notes (Signed)
[facility]  [address]  [place], [place]  [address]  Phone:  [phone]  Fax:  [phone]    SPEECH THERAPY DAILY TREATMENT NOTE    PATIENT: [name] [name] DOB: 09-Mar-1940   MR #: [id number]  AGE: 79 y.o.    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 Discussed and reviewed patient's medical history and personal risk factors and patient acknowledges exposure risk and wishes to proceed with care.    [id number] Screen: Patient does not present with a temperature >/= 100.0, no new cough, no new throat pain, and no new shortness of breath.     Personal Protective Equipment Utilized:  Goggles and Procedure Mask    Medications:  has a current medication list which includes the following prescription(s): allopurinol, ascorbic acid, aspirin ec, atorvastatin, rytary, coq10, glucosamine-chondroitin, losartan, multiple vitamins-minerals, nitroglycerin, and sertraline.  Patient/Caregiver does report changes to medication at this time.  See below    Precautions:   None.    Allergies:  No Known Allergies    SUBJECTIVE REPORT:  Patient presents for speech therapy today alone.     Patient reports, "I feel like I need to warm up."    Pain:    Patient denies pain today.    OBJECTIVE  FINDINGS:  Interventions / Goal Status:  Goals copied from last visit:  Short Term Goals:  To be met by 09/21/19  1. Increase vocal loudness to 80 dB to increase vocal respiratory support for functional communication.  2. Increase sustained vowel phonation to 15+ seconds to increase vocal respiratory support for functional communication.  3. Increase pitch range by >10 Hz higher and lower than baseline (140 Hz low, 300 Hz high).    Long Term Goals:  To be met by 11/03/19  1. Increase volume for functional phrases to 80 dB in order to increase safety in the home and community.  2. Increase vocal loudness to 75dB during reading increase respiratory support for functional communication.  3. Increase vocal loudness to >75 dB during conversational speech.  4. Pt will demonstrate independence in pharyngeal strengthening exercises to decrease risk of aspiration.      GOAL # INTERVENTIONS PROGRESS   STG 1 and 2 LSVT:  SLP utilized LSVT Companion software along with verbal and visual cues to promote vocal performance.  LSVT:  Patient was trained in LSVT home program promoting vocal strength and performance.  Sustained /a/:  81.8 dB; 16.3 seconds (* 12/15)   STG 3 LSVT:  SLP utilized LSVT Companion software along with verbal and visual cues to promote vocal performance.  LSVT:  Patient was trained in LSVT home program promoting vocal strength and performance.  Hi /a/: 82.1 dB; 295  Hz (* 5/15)  Low /a/:  73.4 dB; 137 Hz (* 0/15)    LTG 1 LSVT:  SLP utilized LSVT Companion software along with verbal and visual cues to promote vocal performance.  LSVT:  Patient was trained in LSVT home program promoting vocal strength and performance.  Functional phrases: 78.2 dB (* 0/5)   LTG 2 LSVT:  SLP utilized LSVT Companion software along with verbal and visual cues to promote vocal performance.  LSVT:  Patient was trained in LSVT home program promoting vocal strength and performance.  Paragraphs x 5 min, 74.6 dB       Education/Home  Exercise Program:    Patient was present in the session and was an active participant.  LSVT HEP issued: 6 Loud/long "ah", 6 high-pitched "ah", 6 low-pitched "ah", once through functional phrases and read aloud any paragraph length publication for 5 minutes. Pt to take note to use exaggerated facial movements and voice inflections during all phrases. Pt to take full diaphragmatic breaths between each phrase-length utterance.       ASSESSMENT:: [name] [name] is a 79 y.o. 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    Severe speech disturbance c/b poor breath support, limited pitch range and low volume for voice, leaving patient with approximately 50% intelligibility to an unfamiliar listener and in a noisy environment.  His voice poses a severe risk for effective communication in the event of an emergency and has negatively impacted hisability to communicate in His daily environment.  His decreased volume affects his ability to communicate.  Skilled services will focus on 1) Training in specific techniques for Parkinson's symptoms, 2) Analysis of patient behaviors and how it effects speech, 3) Modifying and shaping patient behavior, 4) Redirect patient for carryover, and 5) Assess progress and advance through program.  Therapy is focused on a restorative nature of the voice and vocal weaknesses associated in the decline as a result of Parkinson's Disease.     It is medically necessary for [name] [name] to recieve skilled speech therapy intervention in order to address the above impairments and functional limitations.      Patient presents with the following functional limitations:  Patient has difficulty with communicating Conversational/Spontaneous Speech.  Patient is unable to effectively provide breath support for sustained vowel sounds and audible volume for daily needs/wants, for the purpose of communicating in a safety  event..  Patient is unable to effectively communicate basic wants and needs.    Without intervention, patient is at risk for:  Decreased social interactions that may lead to further cognitive decline and contribute to further medical complications and increased fall risk.  Inability to properly support breath for physical activity and conversations which may contribute to stress and anxiety during these activities with patient feeling lack of air intake.    Patient is progressing well toward above mentioned functional goals.  Pt demonstrated decline today without use of carryover.    PLAN:   It is medically necessary for [name] [name] to recieve skilled speech therapy intervention in order to address the above impairments and functional limitations.    Recommend continue per plan of care to work toward improving breath support in order to increase parameters of voice: duration, pitch range and volume in order to increase patient safety and independence at home, community, work by Scientist, physiological and intent effectively. and prevention of further functional decline of communication capabilities.    NEXT VISIT:  Focus on hierarchy of paragraphs    Therapist Signature:    Janille Draughon, MEd, Physicist, medical Therapist  [facility]  Outpatient Specialty Rehabilitation   [email or web address]    08/29/2019

## 2019-08-30 ENCOUNTER — Ambulatory Visit: Payer: No Typology Code available for payment source

## 2019-08-30 DIAGNOSIS — R1313 Dysphagia, pharyngeal phase: Secondary | ICD-10-CM

## 2019-08-30 DIAGNOSIS — R498 Other voice and resonance disorders: Secondary | ICD-10-CM

## 2019-08-30 DIAGNOSIS — R490 Dysphonia: Secondary | ICD-10-CM

## 2019-08-31 ENCOUNTER — Ambulatory Visit: Payer: No Typology Code available for payment source

## 2019-08-31 DIAGNOSIS — R498 Other voice and resonance disorders: Secondary | ICD-10-CM

## 2019-08-31 DIAGNOSIS — R490 Dysphonia: Secondary | ICD-10-CM

## 2019-08-31 NOTE — Progress Notes (Signed)
[facility]  [address]  [place], [place]  [address]  Phone:  [phone]  Fax:  [phone]    SPEECH THERAPY DAILY TREATMENT NOTE    PATIENT: [name] [name] DOB: 21-May-1940   MR #: [id number]  AGE: 79 y.o.    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 Discussed and reviewed patient's medical history and personal risk factors and patient acknowledges exposure risk and wishes to proceed with care.    [id number] Screen: Patient does not present with a temperature >/= 100.0, no new cough, no new throat pain, and no new shortness of breath.     Personal Protective Equipment Utilized:  Goggles and Procedure Mask    Medications:  has a current medication list which includes the following prescription(s): allopurinol, ascorbic acid, aspirin ec, atorvastatin, rytary, coq10, glucosamine-chondroitin, losartan, multiple vitamins-minerals, nitroglycerin, and sertraline.  Patient/Caregiver does report changes to medication at this time.  See below    Precautions:   None.    Allergies:  No Known Allergies    SUBJECTIVE REPORT:  Patient presents for speech therapy today alone.     Patient reports, "I"m not sure what I can talk about."    Pain:    Patient denies pain today.    OBJECTIVE  FINDINGS:  Interventions / Goal Status:  Goals copied from last visit:  Short Term Goals:  To be met by 09/21/19  1. Increase vocal loudness to 80 dB to increase vocal respiratory support for functional communication.  2. Increase sustained vowel phonation to 15+ seconds to increase vocal respiratory support for functional communication.  3. Increase pitch range by >10 Hz higher and lower than baseline (140 Hz low, 300 Hz high).    Long Term Goals:  To be met by 11/03/19  1. Increase volume for functional phrases to 80 dB in order to increase safety in the home and community.  2. Increase vocal loudness to 75dB during reading increase respiratory support for functional communication.  3. Increase vocal loudness to >75 dB during conversational speech.  4. Pt will demonstrate independence in pharyngeal strengthening exercises to decrease risk of aspiration.      GOAL # INTERVENTIONS PROGRESS   STG 1 and 2 LSVT:  SLP utilized LSVT Companion software along with verbal and visual cues to promote vocal performance.  LSVT:  Patient was trained in LSVT home program promoting vocal strength and performance.  Sustained /a/:  83.3 dB; 18.4 seconds (* 12/15)   STG 3 LSVT:  SLP utilized LSVT Companion software along with verbal and visual cues to promote vocal performance.  LSVT:  Patient was trained in LSVT home program promoting vocal strength and performance.  Hi /a/: 84.2 dB; 281  Hz (* 1/15)  Low /a/:  72.3 dB; 143.8 Hz (* 0/15)    LTG 1 LSVT:  SLP utilized LSVT Companion software along with verbal and visual cues to promote vocal performance.  LSVT:  Patient was trained in LSVT home program promoting vocal strength and performance.  Functional phrases: 78 dB (* 0/5)   LTG 3 LSVT:  SLP utilized LSVT Companion software along with verbal and visual cues to promote vocal performance.  LSVT:  Patient was trained in LSVT home program promoting vocal strength and performance.  Conversation x 5 min, 74.2 dB       Education/Home  Exercise Program:    Patient was present in the session and was an active participant.  LSVT HEP issued: 6 Loud/long "ah", 6 high-pitched "ah", 6 low-pitched "ah", once through functional phrases and carry on a 5-minute conversation. Pt to take note to use exaggerated facial movements and voice inflections during all phrases. Pt to take full diaphragmatic breaths between each phrase-length utterance        ASSESSMENT:: [name] [name] is a 79 y.o. 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    Severe speech disturbance c/b poor breath support, limited pitch range and low volume for voice, leaving patient with approximately 50% intelligibility to an unfamiliar listener and in a noisy environment.  His voice poses a severe risk for effective communication in the event of an emergency and has negatively impacted hisability to communicate in His daily environment.  His decreased volume affects his ability to communicate.  Skilled services will focus on 1) Training in specific techniques for Parkinson's symptoms, 2) Analysis of patient behaviors and how it effects speech, 3) Modifying and shaping patient behavior, 4) Redirect patient for carryover, and 5) Assess progress and advance through program.  Therapy is focused on a restorative nature of the voice and vocal weaknesses associated in the decline as a result of Parkinson's Disease.     It is medically necessary for [name] [name] to recieve skilled speech therapy intervention in order to address the above impairments and functional limitations.      Patient presents with the following functional limitations:  Patient has difficulty with communicating Conversational/Spontaneous Speech.  Patient is unable to effectively provide breath support for sustained vowel sounds and audible volume for daily needs/wants, for the purpose of communicating in a safety event..  Patient is unable  to effectively communicate basic wants and needs.    Without intervention, patient is at risk for:  Decreased social interactions that may lead to further cognitive decline and contribute to further medical complications and increased fall risk.  Inability to properly support breath for physical activity and conversations which may contribute to stress and anxiety during these activities with patient feeling lack of air intake.    Patient is progressing well toward above mentioned functional goals.      PLAN:   It is medically necessary for [name] [name] to recieve skilled speech therapy intervention in order to address the above impairments and functional limitations.    Recommend continue per plan of care to work toward improving breath support in order to increase parameters of voice: duration, pitch range and volume in order to increase patient safety and independence at home, community, work by Scientist, physiological and intent effectively. and prevention of further functional decline of communication capabilities.    NEXT VISIT: Focus on hierarchy of conversation    Therapist Signature:  Harding Thomure, MEd, Physicist, medical Therapist  [facility]  Outpatient Specialty Rehabilitation   [email or web address]    08/31/2019

## 2019-09-04 ENCOUNTER — Ambulatory Visit: Payer: No Typology Code available for payment source

## 2019-09-04 DIAGNOSIS — R498 Other voice and resonance disorders: Secondary | ICD-10-CM

## 2019-09-04 DIAGNOSIS — R490 Dysphonia: Secondary | ICD-10-CM

## 2019-09-05 ENCOUNTER — Ambulatory Visit: Payer: No Typology Code available for payment source

## 2019-09-05 DIAGNOSIS — R498 Other voice and resonance disorders: Secondary | ICD-10-CM

## 2019-09-05 DIAGNOSIS — R490 Dysphonia: Secondary | ICD-10-CM

## 2019-09-05 NOTE — Progress Notes (Signed)
[facility]  [address]  [place], [place]  [address]  Phone:  [phone]  Fax:  [phone]    SPEECH THERAPY DAILY TREATMENT NOTE    PATIENT: [name] [name] DOB: [birth year]   MR #: [id number]  AGE: 79 y.o.    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 Discussed and reviewed patient's medical history and personal risk factors and patient acknowledges exposure risk and wishes to proceed with care.    IRCVE93 Screen: Patient does not present with a temperature >/= 100.0, no new cough, no new throat pain, and no new shortness of breath.     Personal Protective Equipment Utilized:  Goggles and Procedure Mask    Medications:  has a current medication list which includes the following prescription(s): allopurinol, ascorbic acid, aspirin ec, atorvastatin, rytary, coq10, glucosamine-chondroitin, losartan, multiple vitamins-minerals, nitroglycerin, and sertraline.  Patient/Caregiver does report changes to medication at this time.  See below    Precautions:   None.    Allergies:  No Known Allergies    SUBJECTIVE REPORT:  Patient presents for speech therapy today alone.     Patient reports, "II feel like I am louder than it says."    Pain:    Patient denies pain today.    OBJECTIVE  FINDINGS:  Interventions / Goal Status:  Goals copied from last visit:  Short Term Goals:  To be met by 09/21/19  1. Increase vocal loudness to 80 dB to increase vocal respiratory support for functional communication.  2. Increase sustained vowel phonation to 15+ seconds to increase vocal respiratory support for functional communication.  3. Increase pitch range by >10 Hz higher and lower than baseline (140 Hz low, 300 Hz high).    Long Term Goals:  To be met by 11/03/19  1. Increase volume for functional phrases to 80 dB in order to increase safety in the home and community.  2. Increase vocal loudness to 75dB during reading increase respiratory support for functional communication.  3. Increase vocal loudness to >75 dB during conversational speech.  4. Pt will demonstrate independence in pharyngeal strengthening exercises to decrease risk of aspiration.      GOAL # INTERVENTIONS PROGRESS   STG 1 and 2 LSVT:  SLP utilized LSVT Companion software along with verbal and visual cues to promote vocal performance.  LSVT:  Patient was trained in LSVT home program promoting vocal strength and performance.  Sustained /a/:  83.8 dB; 17.9 seconds (* 12/15)   STG 3 LSVT:  SLP utilized LSVT Companion software along with verbal and visual cues to promote vocal performance.  LSVT:  Patient was trained in LSVT home program promoting vocal strength and performance.  Hi /a/: 83.3 dB;  334.6 Hz (* 12/15)  Low /a/:  75.7 dB; 152.8 Hz (* 0/15)    LTG 1 LSVT:  SLP utilized LSVT Companion software along with verbal and visual cues to promote vocal performance.  LSVT:  Patient was trained in LSVT home program promoting vocal strength and performance.  Functional phrases: 78.9 dB (* 0/5)   LTG 3 LSVT:  SLP utilized LSVT Companion software along with verbal and visual cues to promote vocal performance.  LSVT:  Patient was trained in LSVT home program promoting vocal strength and performance.  Conversation x 5 min, 71.7 dB       Education/Home  Exercise Program:    Patient was present in the session and was an active participant.  LSVT HEP issued: 6 Loud/long "ah", 6 high-pitched "ah", 6 low-pitched "ah", once through functional phrases and carry on a 5-minute conversation. Pt to take note to use exaggerated facial movements and voice inflections during all phrases. Pt to take full diaphragmatic breaths between each phrase-length utterance        ASSESSMENT:: [name] [name] is a 79 y.o. 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    Severe speech disturbance c/b poor breath support, limited pitch range and low volume for voice, leaving patient with approximately 50% intelligibility to an unfamiliar listener and in a noisy environment.  His voice poses a severe risk for effective communication in the event of an emergency and has negatively impacted hisability to communicate in His daily environment.  His decreased volume affects his ability to communicate.  Skilled services will focus on 1) Training in specific techniques for Parkinson's symptoms, 2) Analysis of patient behaviors and how it effects speech, 3) Modifying and shaping patient behavior, 4) Redirect patient for carryover, and 5) Assess progress and advance through program.  Therapy is focused on a restorative nature of the voice and vocal weaknesses associated in the decline as a result of Parkinson's Disease.     It is medically necessary for [facility] to recieve skilled speech therapy intervention in order to address the above impairments and functional limitations.      Patient presents with the following functional limitations:  Patient has difficulty with communicating Conversational/Spontaneous Speech.  Patient is unable to effectively provide breath support for sustained vowel sounds and audible volume for daily needs/wants, for the purpose of communicating in a safety event..  Patient is unable  to effectively communicate basic wants and needs.    Without intervention, patient is at risk for:  Decreased social interactions that may lead to further cognitive decline and contribute to further medical complications and increased fall risk.  Inability to properly support breath for physical activity and conversations which may contribute to stress and anxiety during these activities with patient feeling lack of air intake.    Patient is progressing well toward above mentioned functional goals.      PLAN:   It is medically necessary for Renan Danese to recieve skilled speech therapy intervention in order to address the above impairments and functional limitations.    Recommend continue per plan of care to work toward improving breath support in order to increase parameters of voice: duration, pitch range and volume in order to increase patient safety and independence at home, community, work by Scientist, physiological and intent effectively. and prevention of further functional decline of communication capabilities.    NEXT VISIT: Focus on hierarchy of conversation    Therapist  Signature:    Gwendlyn Hanback, MEd, Physicist, medical Therapist  [facility]  Outpatient Specialty Rehabilitation   [email or web address]    09/05/2019

## 2019-09-05 NOTE — Progress Notes (Signed)
[facility]  [address]  [place], [place]  [address]  Phone:  [phone]  Fax:  [phone]    SPEECH THERAPY DAILY TREATMENT NOTE    PATIENT: [name] [name] DOB: May 16, 1940   MR #: [id number]  AGE: 79 y.o.    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 Discussed and reviewed patient's medical history and personal risk factors and patient acknowledges exposure risk and wishes to proceed with care.    [id number] Screen: Patient does not present with a temperature >/= 100.0, no new cough, no new throat pain, and no new shortness of breath.     Personal Protective Equipment Utilized:  Goggles and Procedure Mask    Medications:  has a current medication list which includes the following prescription(s): allopurinol, ascorbic acid, aspirin ec, atorvastatin, rytary, coq10, glucosamine-chondroitin, losartan, multiple vitamins-minerals, nitroglycerin, and sertraline.  Patient/Caregiver does report changes to medication at this time.  See below    Precautions:   None.    Allergies:  No Known Allergies    SUBJECTIVE REPORT:  Patient presents for speech therapy today alone.     Patient reports, "My sister's visiting.  I can talk about family."    Pain:    Patient denies pain  today.    OBJECTIVE FINDINGS:  Interventions / Goal Status:  Goals copied from last visit:  Short Term Goals:  To be met by 09/21/19  1. Increase vocal loudness to 80 dB to increase vocal respiratory support for functional communication.  2. Increase sustained vowel phonation to 15+ seconds to increase vocal respiratory support for functional communication.  3. Increase pitch range by >10 Hz higher and lower than baseline (140 Hz low, 300 Hz high).    Long Term Goals:  To be met by 11/03/19  1. Increase volume for functional phrases to 80 dB in order to increase safety in the home and community.  2. Increase vocal loudness to 75dB during reading increase respiratory support for functional communication.  3. Increase vocal loudness to >75 dB during conversational speech.  4. Pt will demonstrate independence in pharyngeal strengthening exercises to decrease risk of aspiration.      GOAL # INTERVENTIONS PROGRESS   STG 1 and 2 LSVT:  SLP utilized LSVT Companion software along with verbal and visual cues to promote vocal performance.  LSVT:  Patient was trained in LSVT home program promoting vocal strength and performance.  Sustained /a/:  74.1 dB; 16.6 seconds (* 0/15)   STG 3 LSVT:  SLP utilized LSVT Companion software along with verbal and visual cues to promote vocal performance.  LSVT:  Patient was trained in LSVT home program promoting vocal strength and performance.  Hi /a/: 76.5 dB;  297.9 Hz (* 0/15)  Low /a/:  65.8 dB; 172.9 Hz (* 0/15)    LTG 1 LSVT:  SLP utilized LSVT Companion software along with verbal and visual cues to promote vocal performance.  LSVT:  Patient was trained in LSVT home program promoting vocal strength and performance.  Functional phrases: 68.1 dB (* 0/5)   LTG 3 LSVT:  SLP utilized LSVT Companion software along with verbal and visual cues to promote vocal performance.  LSVT:  Patient was trained in LSVT home program promoting vocal strength and performance.  Conversation x 5 min, 67 dB        Education/Home Exercise Program:    Patient was present in the session and was an active participant.  LSVT HEP issued: 6 Loud/long "ah", 6 high-pitched "ah", 6 low-pitched "ah", once through functional phrases and carry on a 5-minute conversation. Pt to take note to use exaggerated facial movements and voice inflections during all phrases. Pt to take full diaphragmatic breaths between each phrase-length utterance       ASSESSMENT:: [name] [name] is a 79 y.o. 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    Severe speech disturbance c/b poor breath support, limited pitch range and low volume for voice, leaving patient with approximately 50% intelligibility to an unfamiliar listener and in a noisy environment.  His voice poses a severe risk for effective communication in the event of an emergency and has negatively impacted hisability to communicate in His daily environment.  His decreased volume affects his ability to communicate.  Skilled services will focus on 1) Training in specific techniques for Parkinson's symptoms, 2) Analysis of patient behaviors and how it effects speech, 3) Modifying and shaping patient behavior, 4) Redirect patient for carryover, and 5) Assess progress and advance through program.  Therapy is focused on a restorative nature of the voice and vocal weaknesses associated in the decline as a result of Parkinson's Disease.     It is medically necessary for [name] [name] to recieve skilled speech therapy intervention in order to address the above impairments and functional limitations.      Patient presents with the following functional limitations:  Patient has difficulty with communicating Conversational/Spontaneous Speech.  Patient is unable to effectively provide breath support for sustained vowel sounds and audible volume for daily needs/wants, for the purpose of communicating in a safety  event..  Patient is unable to effectively communicate basic wants and needs.    Without intervention, patient is at risk for:  Decreased social interactions that may lead to further cognitive decline and contribute to further medical complications and increased fall risk.  Inability to properly support breath for physical activity and conversations which may contribute to stress and anxiety during these activities with patient feeling lack of air intake.    Patient is progressing well toward above mentioned functional goals.  Pt demonstrated decline today without use of carryover.    PLAN:   It is medically necessary for [name] [name] to recieve skilled speech therapy intervention in order to address the above impairments and functional limitations.    Recommend continue per plan of care to work toward improving breath support in order to increase parameters of voice: duration, pitch range and volume in order to increase patient safety and independence at home, community, work by Scientist, physiological and intent effectively. and prevention of further functional decline of communication capabilities.    NEXT VISIT: Focus on hierarchy  of conversation    Therapist Signature:    Tyreshia Ingman, MEd, Physicist, medical Therapist  [facility]  Outpatient Specialty Rehabilitation   [email or web address]    09/05/2019

## 2019-09-05 NOTE — Progress Notes (Signed)
[facility]  [address], [address]  [place], [place]  [address]  Phone:  [phone]  Fax:  [phone]    SPEECH THERAPY DAILY TREATMENT NOTE    PATIENT: [name] [name] DOB: Dec 02, 1940   MR #: [id number]  AGE: 79 y.o.    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 Discussed and reviewed patient's medical history and personal risk factors and patient acknowledges exposure risk and wishes to proceed with care.    [id number] Screen: Patient does not present with a temperature >/= 100.0, no new cough, no new throat pain, and no new shortness of breath.     Personal Protective Equipment Utilized:  Goggles and Procedure Mask    Medications:  has a current medication list which includes the following prescription(s): allopurinol, ascorbic acid, aspirin ec, atorvastatin, rytary, coq10, glucosamine-chondroitin, losartan, multiple vitamins-minerals, nitroglycerin, and sertraline.  Patient/Caregiver does report changes to medication at this time.  See below    Precautions:   None.    Allergies:  No Known Allergies    SUBJECTIVE REPORT:  Patient presents for speech therapy today alone.     Patient reports, "I feel okay today."    Pain:    Patient denies pain today.    OBJECTIVE  FINDINGS:  Interventions / Goal Status:  Goals copied from last visit:  Short Term Goals:  To be met by 09/21/19  1. Increase vocal loudness to 80 dB to increase vocal respiratory support for functional communication.  2. Increase sustained vowel phonation to 15+ seconds to increase vocal respiratory support for functional communication.  3. Increase pitch range by >10 Hz higher and lower than baseline (140 Hz low, 300 Hz high).    Long Term Goals:  To be met by 11/03/19  1. Increase volume for functional phrases to 80 dB in order to increase safety in the home and community.  2. Increase vocal loudness to 75dB during reading increase respiratory support for functional communication.  3. Increase vocal loudness to >75 dB during conversational speech.  4. Pt will demonstrate independence in pharyngeal strengthening exercises to decrease risk of aspiration.      GOAL # INTERVENTIONS PROGRESS   STG 1 and 2 LSVT:  SLP utilized LSVT Companion software along with verbal and visual cues to promote vocal performance.  LSVT:  Patient was trained in LSVT home program promoting vocal strength and performance.  Sustained /a/:  81.4 dB; 13.4 seconds (* 5/15)   STG 3 LSVT:  SLP utilized LSVT Companion software along with verbal and visual cues to promote vocal performance.  LSVT:  Patient was trained in LSVT home program promoting vocal strength and performance.  Hi /a/: 82.2 dB; 301.5 Hz (* 4/15)  Low /a/:  73 dB; 163.1 Hz (* 0/15)    LTG 1 LSVT:  SLP utilized LSVT Companion software along with verbal and visual cues to promote vocal performance.  LSVT:  Patient was trained in LSVT home program promoting vocal strength and performance.  Functional phrases: 76.4 dB (* 0/5)   LTG 2 LSVT:  SLP utilized LSVT Companion software along with verbal and visual cues to promote vocal performance.  LSVT:  Patient was trained in LSVT home program promoting vocal strength and performance.  Paragraphs x 5 min, 76.6 dB       Education/Home  Exercise Program:    Patient was present in the session and was an active participant.  LSVT HEP issued: 6 Loud/long "ah", 6 high-pitched "ah", 6 low-pitched "ah", once through functional phrases and read aloud any paragraph length publication for 5 minutes. Pt to take note to use exaggerated facial movements and voice inflections during all phrases. Pt to take full diaphragmatic breaths between each phrase-length utterance.       ASSESSMENT:: [name] [name] is a 79 y.o. 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    Severe speech disturbance c/b poor breath support, limited pitch range and low volume for voice, leaving patient with approximately 50% intelligibility to an unfamiliar listener and in a noisy environment.  His voice poses a severe risk for effective communication in the event of an emergency and has negatively impacted hisability to communicate in His daily environment.  His decreased volume affects his ability to communicate.  Skilled services will focus on 1) Training in specific techniques for Parkinson's symptoms, 2) Analysis of patient behaviors and how it effects speech, 3) Modifying and shaping patient behavior, 4) Redirect patient for carryover, and 5) Assess progress and advance through program.  Therapy is focused on a restorative nature of the voice and vocal weaknesses associated in the decline as a result of Parkinson's Disease.     It is medically necessary for [name] [name] to recieve skilled speech therapy intervention in order to address the above impairments and functional limitations.      Patient presents with the following functional limitations:  Patient has difficulty with communicating Conversational/Spontaneous Speech.  Patient is unable to effectively provide breath support for sustained vowel sounds and audible volume for daily needs/wants, for the purpose of communicating in a safety  event..  Patient is unable to effectively communicate basic wants and needs.    Without intervention, patient is at risk for:  Decreased social interactions that may lead to further cognitive decline and contribute to further medical complications and increased fall risk.  Inability to properly support breath for physical activity and conversations which may contribute to stress and anxiety during these activities with patient feeling lack of air intake.    Patient is progressing well toward above mentioned functional goals.  Pt demonstrated decline today without use of carryover.    PLAN:   It is medically necessary for [name] [name] to recieve skilled speech therapy intervention in order to address the above impairments and functional limitations.    Recommend continue per plan of care to work toward improving breath support in order to increase parameters of voice: duration, pitch range and volume in order to increase patient safety and independence at home, community, work by Scientist, physiological and intent effectively. and prevention of further functional decline of communication capabilities.    NEXT VISIT: Focus on hierarchy of  conversation    Therapist Signature:    Zuleyka Kloc, MEd, Physicist, medical Therapist  [facility]  Outpatient Specialty Rehabilitation   [email or web address]    08/30/19

## 2019-09-06 ENCOUNTER — Ambulatory Visit: Payer: No Typology Code available for payment source

## 2019-09-06 DIAGNOSIS — R498 Other voice and resonance disorders: Secondary | ICD-10-CM

## 2019-09-06 DIAGNOSIS — R1313 Dysphagia, pharyngeal phase: Secondary | ICD-10-CM

## 2019-09-06 DIAGNOSIS — R490 Dysphonia: Secondary | ICD-10-CM

## 2019-09-06 NOTE — Discharge Summary (Signed)
[facility]  [address], [address]  [place], [place]  [address]  Phone:  [phone]  Fax:  [phone]    SPEECH THERAPY DAILY TREATMENT NOTE and DISCHARGE NOTE    PATIENT: [name] [name] DOB: 03-22-1940   MR #: [id number]  AGE: 79 y.o.    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 Discussed and reviewed patient's medical history and personal risk factors and patient acknowledges exposure risk and wishes to proceed with care.    [id number] Screen: Patient does not present with a temperature >/= 100.0, no new cough, no new throat pain, and no new shortness of breath.     Personal Protective Equipment Utilized:  Goggles and Procedure Mask    Medications:  has a current medication list which includes the following prescription(s): allopurinol, ascorbic acid, aspirin ec, atorvastatin, rytary, coq10, glucosamine-chondroitin, losartan, multiple vitamins-minerals, nitroglycerin, and sertraline.  Patient/Caregiver does report changes to medication at this time.  See below    Precautions:   None.    Allergies:  No Known Allergies    SUBJECTIVE REPORT:  Patient presents for speech therapy today alone.     Patient reports, "My son is hard of hearing.  I think he can hear me better when I talk to  him.  He hasn't said so, because he won't admit he can't hear me."    Pain:    Patient denies pain today.    OBJECTIVE FINDINGS:  Interventions / Goal Status:  Goals copied from last visit:  Short Term Goals:  To be met by 09/21/19  1. Increase vocal loudness to 80 dB to increase vocal respiratory support for functional communication.  2. Increase sustained vowel phonation to 15+ seconds to increase vocal respiratory support for functional communication.  3. Increase pitch range by >10 Hz higher and lower than baseline (140 Hz low, 300 Hz high).    Long Term Goals:  To be met by 11/03/19  1. Increase volume for functional phrases to 80 dB in order to increase safety in the home and community.  2. Increase vocal loudness to 75dB during reading increase respiratory support for functional communication.  3. Increase vocal loudness to >75 dB during conversational speech.  4. Pt will demonstrate independence in pharyngeal strengthening exercises to decrease risk of aspiration.      GOAL # INTERVENTIONS PROGRESS GOAL STATUS   STG 1 and 2 LSVT:  SLP utilized LSVT Companion software along with verbal and visual cues to promote vocal performance.  LSVT:  Patient was trained in LSVT home program promoting vocal strength and performance.  Sustained /a/:  85.3 dB; 18.4 seconds (* 15/15) MET   STG 3 LSVT:  SLP utilized Landscape architect along  with verbal and visual cues to promote vocal performance.  LSVT:  Patient was trained in LSVT home program promoting vocal strength and performance.  Hi /a/: 83.5 dB; 326.9 Hz (* 9/15)  Low /a/:  75.5 dB; 153.4 Hz (* 0/15)  MET for high pitch  PROGRESS for low   LTG 1 LSVT:  SLP utilized LSVT Companion software along with verbal and visual cues to promote vocal performance.  LSVT:  Patient was trained in LSVT home program promoting vocal strength and performance.  Functional phrases: 77.3 dB (* 0/5) PROGRESS to an average of as much as 78 dB in an average of 5 rounds of 10 phrases   LTG  3 LSVT:  SLP utilized LSVT Companion software along with verbal and visual cues to promote vocal performance.  LSVT:  Patient was trained in LSVT home program promoting vocal strength and performance.  Conversation x 5 min, 75 dB   MET   LTG 4 Discharged goal    SLP educated pt on signs and symptoms of aspiration and reasons to further discuss any treatment options or need for skilled  Pt demonstrated improvements in signs of aspiration t/o the course of the LSVT exercises and duration of treatment.  Pt and therapist agree that he has improved and does not demonstrate a need to continue skilled therapy to address this at this time.   Discharged goal     Education/Home Exercise Program:    Patient was present in the session and was an active participant.  LSVT HEP issued: 6 Loud/long "ah", 6 high-pitched "ah", 6 low-pitched "ah", once through functional phrases and carry on a 5-minute conversation. Pt to take note to use exaggerated facial movements and voice inflections during all phrases. Pt to take full diaphragmatic breaths between each phrase-length utterance   SLP educated pt on final instructions for HEP of LSVT protocol to be completed over next two months.  Handouts provided on movement disorders specialists in the area, [employer] offered to continue voice strength, PD websites and write up of final instructions.    ASSESSMENT:: [name] [name] is a 79 y.o. male presents to outpatient  speech therapy with the following impairments:   moderate voice disturbance, as characterized by hypophonia, significantly improved over the skilled portion of the LSVT protocol.  Original finding were:    Severe speech disturbance c/b poor breath support, limited pitch range and low volume for voice, leaving patient with approximately 50% intelligibility to an unfamiliar listener and in a noisy environment.  His voice poses a severe risk for effective communication in the event of an emergency and has negatively  impacted hisability to communicate in His daily environment.  His decreased volume affects his ability to communicate.      Therapy was focused on a restorative nature of the voice and vocal weaknesses associated in the decline as a result of Parkinson's Disease.      Patient has progressed well toward above mentioned functional goals.      PLAN:   Patient will complete LSVT protocol by completing his HEP (details above) twice per day for the next two months to complete the three month protocol.  Per the LSVT protocol research, pt should benefit from the gains for up to two years.  At that time, along with the progression of the disease, he may expect to see decline to his voice and require refreshing his skills in treatment again.    Patient should confer with his referring physician/neurologist if decline is  noted before that time or when changes are noted.    A PFNCA supported program to support maintenance of the voice is offered here at [facility] weekly, free to the patient to attend.  Information on this class was provided at discharge.    D/C to HEP at this time.  Pt has met all functional goals for skilled speech therapy.  Pt may continue to see improvements in exercises provided; however, is capable of continuing program independently.  Pt should discuss with referring MD if problems resume.    It was a pleasure working with [name]. Thank you for your referral.  Please contact our offices if you have any questions or concerns.     Therapist Signature:    Shanetta Nicolls, MEd, Physicist, medical Therapist  [facility]  Outpatient Specialty Rehabilitation   [email or web address]    09/06/2019

## 2019-09-23 ENCOUNTER — Other Ambulatory Visit (INDEPENDENT_AMBULATORY_CARE_PROVIDER_SITE_OTHER): Payer: Self-pay | Admitting: Family Medicine

## 2019-09-23 DIAGNOSIS — M109 Gout, unspecified: Secondary | ICD-10-CM

## 2019-10-06 ENCOUNTER — Ambulatory Visit (INDEPENDENT_AMBULATORY_CARE_PROVIDER_SITE_OTHER): Payer: No Typology Code available for payment source | Admitting: Urology

## 2019-10-06 ENCOUNTER — Encounter (INDEPENDENT_AMBULATORY_CARE_PROVIDER_SITE_OTHER): Payer: Self-pay | Admitting: Urology

## 2019-10-06 VITALS — BP 148/76 | HR 82 | Ht 71.0 in | Wt 174.2 lb

## 2019-10-06 DIAGNOSIS — N3941 Urge incontinence: Secondary | ICD-10-CM

## 2019-10-06 DIAGNOSIS — G20 Parkinson's disease: Secondary | ICD-10-CM

## 2019-10-06 DIAGNOSIS — R351 Nocturia: Secondary | ICD-10-CM

## 2019-10-06 LAB — BLADDER SCAN: Urine Volume:: 12

## 2019-10-06 LAB — URINALYSIS POC
POCT Urine Bilirubin: NEGATIVE
POCT Urine Glucose: NEGATIVE mg/dL
POCT Urine Ketones: NEGATIVE mg/dL
POCT Urine Nitrites: NEGATIVE
POCT Urine Urobilibogen: 0.2 mg/dL (ref 0.0–1.0)
POCT Urine pH: 5.5 (ref 5.0–8.0)
Protein, UR POCT: NEGATIVE mg/dL
Urine Specific Gravity POC: 1.02 (ref 1.001–1.035)
Urine leukocyte Esterase, POCT: NEGATIVE

## 2019-10-06 MED ORDER — TROSPIUM CHLORIDE ER 60 MG PO CP24
60.00 mg | ORAL_CAPSULE | Freq: Every evening | ORAL | 6 refills | Status: DC
Start: 2019-10-06 — End: 2019-11-01

## 2019-10-06 NOTE — Addendum Note (Signed)
Addended by: Leandrew Koyanagi on: 10/06/2019 11:34 AM     Modules accepted: Orders

## 2019-10-06 NOTE — Addendum Note (Signed)
Addended by: Azalia Bilis on: 10/06/2019 03:10 PM     Modules accepted: Orders

## 2019-10-06 NOTE — Progress Notes (Signed)
Subjective:      Patient ID: [name] [name] is a 79 y.o. male     Chief Complaint:  1. Nocturia    2. Parkinson's disease    3. Urge incontinence        This is a pleasant 79 year old male.  He is accompanied by his son for the visit.  Visit is for lower urinary tract symptoms.  He has a history of Parkinson's disease.  Symptoms have been ongoing for about 2 years.  He describes urinary urgency, urge incontinence and nocturia.  He uses the bathroom every 2 hours day and night.  Typically nocturia is 4 times but has been up to 10 in one night.  He also reports an interrupted stream at night.  His stream during the day appears to be normal.  He wears depends during the day at night but they are more saturated at night.  He is unsure which symptom is most bothersome.  He denies a history of BPH or elevated PSA.  He has not tried any OAB or BPH meds.  No gross hematuria or dysuria.  He has a history of stones many years ago, no flank pain.  PVR today in the office 12cc.       The following portions of the patient's history were reviewed and updated as appropriate: allergies, current medications, past family history, past medical history, past social history, past surgical history and problem list.    Review of Systems  Systems reviewed per the HPI and below:     History obtained from the patient     General ROS: no fevers, or chills     Gastrointestinal ROS: no abdominal pain, change in bowel habits     Musculoskeletal ROS:  no swelling to lower extremities, no back pain     Objective:     Vitals:    10/06/19 0851   BP: 148/76   Pulse: 82          Physical Exam   Constitutional:  Well-developed, well-nourished, and in no distress.   Pulmonary/Chest: Effort normal.   Neurological: Pt is alert and oriented to person, place, and time.    Psychiatric: Mood, memory, affect and judgment normal.   DRE: small prostate, smooth without nodules    Lab Review   Reviewed results as listed below. Discussed findings with patient.      Urine analysis shows   Results     Procedure Component Value Units Date/Time    Urinalysis POC [[id number]]  (Abnormal) Collected: 10/06/19 0911     Updated: 10/06/19 0909     POCT Urine Color Yellow     POCT Urine Clarity Clear     POCT Urine pH 5.5     Urine leukocyte Esterase, POCT Negative     POCT Urine Nitrites Negative     Protein, UR POCT Negative mg/dL      POCT Urine Glucose Negative mg/dL      POCT Urine Ketones Negative mg/dL      POCT Urine Urobilibogen 0.2 mg/dL      POCT Urine Bilirubin Negative     Blood, UA POCT Trace-intact     Urine Specific Gravity POC 1.020              Radiology Review   Reviewed results as listed below. Discussed results with the patient and answered questions to the best of my ability.   none      Assessment:       Urinary urgency,  frequency and nocturia   H/o parkinson's  Interrupted urinary stream    Discussed with patient and his son that symptoms are likely OAB related to his Parkinson's given small prostate on exam.  We will start with a trial of anticholinergic.  If this worsens his stream may need to add an alpha-blocker.     Plan:   Patient Instructions   -  Your PSA was checked today and I will notify you with results.  Please call the office if you have not heard from me by 1 week   - Start trospium 60mg  XL daily.  This is a medication to help with your urinary frequency.  If you experience bothersome, dry mouth, constipation you should stop this medication    FU 4 weeks     Orders  Orders Placed This Encounter   Procedures   . Urinalysis POC

## 2019-10-06 NOTE — Patient Instructions (Signed)
-    Your PSA was checked today and I will notify you with results.  Please call the office if you have not heard from me by 1 week   - Start trospium 60mg  XL daily.  This is a medication to help with your urinary frequency.  If you experience bothersome, dry mouth, constipation you should stop this medication

## 2019-10-07 LAB — PSA: Prostate Specific Antigen, Total: 0.655 ng/mL (ref 0.000–4.000)

## 2019-10-09 ENCOUNTER — Encounter (INDEPENDENT_AMBULATORY_CARE_PROVIDER_SITE_OTHER): Payer: Self-pay | Admitting: Family Medicine

## 2019-10-09 DIAGNOSIS — K224 Dyskinesia of esophagus: Secondary | ICD-10-CM

## 2019-10-10 ENCOUNTER — Other Ambulatory Visit (INDEPENDENT_AMBULATORY_CARE_PROVIDER_SITE_OTHER): Payer: Self-pay | Admitting: Family Medicine

## 2019-10-10 ENCOUNTER — Encounter (INDEPENDENT_AMBULATORY_CARE_PROVIDER_SITE_OTHER): Payer: Self-pay | Admitting: Urology

## 2019-10-10 DIAGNOSIS — M109 Gout, unspecified: Secondary | ICD-10-CM

## 2019-10-13 ENCOUNTER — Encounter (INDEPENDENT_AMBULATORY_CARE_PROVIDER_SITE_OTHER): Payer: Self-pay | Admitting: Urology

## 2019-10-17 MED ORDER — OXYBUTYNIN CHLORIDE ER 10 MG PO TB24
10.00 mg | ORAL_TABLET | Freq: Every day | ORAL | 6 refills | Status: AC
Start: 2019-10-17 — End: 2020-10-16

## 2019-10-17 NOTE — Progress Notes (Signed)
Diagnoses and all orders for this visit:  Diffuse esophageal spasm  -     Referral to Gastroenterology - EXTERNAL

## 2019-10-23 ENCOUNTER — Encounter (INDEPENDENT_AMBULATORY_CARE_PROVIDER_SITE_OTHER): Payer: Self-pay | Admitting: Family Medicine

## 2019-10-23 ENCOUNTER — Telehealth (INDEPENDENT_AMBULATORY_CARE_PROVIDER_SITE_OTHER): Payer: No Typology Code available for payment source | Admitting: Family Medicine

## 2019-10-23 DIAGNOSIS — G20 Parkinson's disease: Secondary | ICD-10-CM

## 2019-10-23 DIAGNOSIS — Z951 Presence of aortocoronary bypass graft: Secondary | ICD-10-CM

## 2019-10-23 DIAGNOSIS — N3281 Overactive bladder: Secondary | ICD-10-CM

## 2019-10-23 DIAGNOSIS — I10 Essential (primary) hypertension: Secondary | ICD-10-CM

## 2019-10-23 NOTE — Progress Notes (Signed)
[facility] [place] [facility]                       Date of Exam: 10/23/2019        Patient ID: [name] [name] is a 79 y.o. male.  Attending Physician: [name], MD          Chief Complaint:    Urinary Frequency, PD, and Dm            HPI:    HPI  Telepohone only visit : patient at home in [place]. Patient ID verified address DOB.   1. Pd : neuro Dr. Victoriano Lain, PD is controlled, no falls in past year, is forgetting meds, son needs to remind him.  He is currently living with his son  2. Urology; Sees Dr. Carroll Sage started on anticholenrgic and has all the symptoms of anticholnergic, dry mouth, contsitpation.  He still has end weak stream and urinary frequency  3. DM : a1c 4.9 off all meds encouraged to repeat labs off all meds possibly resolved          Problem List:    Patient Active Problem List   Diagnosis   . Parkinson's disease   . Diabetes mellitus type 2, controlled   . Mild episode of recurrent major depressive disorder   . Neutrophilic leukocytosis   . Low back pain   . Hypertension   . Hepatic cyst   . Dyslipidemia   . Gouty arthropathy   . Diffuse esophageal spasm   . Depression   . CAD (coronary artery disease)   . Anosmia   . Benign essential hypertension   . Angina pectoris   . Nocturia   . Optic neuritis   . Other and unspecified hyperlipidemia   . Pulmonary nodule, left   . S/P CABG (coronary artery bypass graft)   . Shoulder joint pain   . Tremor   . Ureteric stone   . Urge incontinence             Current Meds:    Current Outpatient Medications on File Prior to Visit   Medication Sig Dispense Refill   . allopurinol (ZYLOPRIM) 300 MG tablet TAKE 1 TABLET BY MOUTH EVERY DAY 30 tablet 0   . Ascorbic Acid (VITAMIN C PO) Take 1,000 mg by mouth     . aspirin EC 81 MG EC tablet Take by mouth     . atorvastatin (LIPITOR) 20 MG tablet TAKE 1 TABLET BY MOUTH EVERYDAY AT BEDTIME 90 tablet 4   . carbidopa-levodopa (Rytary) 36.25-145 MG Cap CR per capsule Take 2 capsules by mouth 4  (four) times daily 5 hours apart. 720 capsule 3   . Coenzyme Q10 (CoQ10) 200 MG Cap Take 400 mg by mouth     . glucosamine-chondroitin 500-400 MG tablet Take 1 tablet by mouth     . losartan (COZAAR) 25 MG tablet TAKE 1 TABLET BY MOUTH EVERY DAY 90 tablet 4   . Multiple Vitamins-Minerals (CENTRUM SILVER 50+MEN PO) Take 1 tablet by mouth     . oxybutynin (DITROPAN-XL) 10 MG 24 hr tablet Take 1 tablet (10 mg total) by mouth daily 30 tablet 6   . sertraline (ZOLOFT) 25 MG tablet TAKE 1 TABLET BY MOUTH EVERY DAY 90 tablet 1   . Trospium Chloride 60 MG Capsule SR 24 hr Take 60 mg by mouth nightly 30 each 6     No current facility-administered medications on  file prior to visit.          Allergies:    No Known Allergies          Past Surgical History:    No past surgical history on file.        Family History:    No family history on file.        Social History:    Social History     Tobacco Use   . Smoking status: Never Smoker   . Smokeless tobacco: Never Used   Vaping Use   . Vaping Use: Never used   Substance Use Topics   . Alcohol use: Not Currently   . Drug use: Never                 Vital Signs:    There were no vitals taken for this visit.         ROS:    Review of Systems   All other systems reviewed and are negative.             Physical Exam:    Physical Exam  Vitals reviewed.   Constitutional:       Appearance: Normal appearance.   HENT:      Head: Normocephalic.      Nose: Nose normal.   Pulmonary:      Effort: Pulmonary effort is normal.      Comments: Speaking Full sentence  Neurological:      Mental Status: He is alert.   Psychiatric:         Mood and Affect: Mood normal.         Behavior: Behavior normal.         Thought Content: Thought content normal.              Assessment:      1. Parkinson's disease    2. S/P CABG (coronary artery bypass graft)    3. Essential hypertension    4. OAB (overactive bladder)              Plan:      1. PD needs memory assessment as he is forgetting pills, will do with  Neuro Dr. Victoriano Lain  Also come back for AWV  2. CV risk: check status of DM off all meds, last FBS 127 last week, bp 120/80  3. OAB: possible bph will See Urology 10/8 at that time may start alpha blocker  After starting alpha blocker he is advised to CHECK BP DAILY if bp low he is to follow up with [place] to adjust meds. Otherwise can f/u in 6 months            Follow-up:    Return in about 6 months (around 04/21/2020) for AWV .           [name], MD

## 2019-10-27 ENCOUNTER — Other Ambulatory Visit: Payer: Self-pay | Admitting: Student in an Organized Health Care Education/Training Program

## 2019-10-27 DIAGNOSIS — R1312 Dysphagia, oropharyngeal phase: Secondary | ICD-10-CM

## 2019-11-01 ENCOUNTER — Observation Stay
Admission: EM | Admit: 2019-11-01 | Discharge: 2019-11-02 | Disposition: A | Discharge status: Home / Self Care | Payer: No Typology Code available for payment source | Attending: Family Medicine | Admitting: Family Medicine

## 2019-11-01 ENCOUNTER — Emergency Department: Payer: No Typology Code available for payment source

## 2019-11-01 DIAGNOSIS — E785 Hyperlipidemia, unspecified: Secondary | ICD-10-CM

## 2019-11-01 DIAGNOSIS — Z951 Presence of aortocoronary bypass graft: Secondary | ICD-10-CM | POA: Insufficient documentation

## 2019-11-01 DIAGNOSIS — E7849 Other hyperlipidemia: Secondary | ICD-10-CM | POA: Diagnosis present

## 2019-11-01 DIAGNOSIS — G20 Parkinson's disease: Secondary | ICD-10-CM | POA: Insufficient documentation

## 2019-11-01 DIAGNOSIS — R1310 Dysphagia, unspecified: Secondary | ICD-10-CM | POA: Insufficient documentation

## 2019-11-01 DIAGNOSIS — R0789 Other chest pain: Secondary | ICD-10-CM | POA: Insufficient documentation

## 2019-11-01 DIAGNOSIS — R1012 Left upper quadrant pain: Secondary | ICD-10-CM | POA: Insufficient documentation

## 2019-11-01 DIAGNOSIS — R079 Chest pain, unspecified: Secondary | ICD-10-CM

## 2019-11-01 DIAGNOSIS — Z20822 Contact with and (suspected) exposure to covid-19: Secondary | ICD-10-CM | POA: Insufficient documentation

## 2019-11-01 DIAGNOSIS — K219 Gastro-esophageal reflux disease without esophagitis: Secondary | ICD-10-CM | POA: Insufficient documentation

## 2019-11-01 DIAGNOSIS — M109 Gout, unspecified: Secondary | ICD-10-CM | POA: Insufficient documentation

## 2019-11-01 DIAGNOSIS — R1312 Dysphagia, oropharyngeal phase: Secondary | ICD-10-CM

## 2019-11-01 DIAGNOSIS — Z79899 Other long term (current) drug therapy: Secondary | ICD-10-CM | POA: Insufficient documentation

## 2019-11-01 DIAGNOSIS — R110 Nausea: Secondary | ICD-10-CM | POA: Insufficient documentation

## 2019-11-01 DIAGNOSIS — I10 Essential (primary) hypertension: Secondary | ICD-10-CM

## 2019-11-01 DIAGNOSIS — R339 Retention of urine, unspecified: Secondary | ICD-10-CM | POA: Insufficient documentation

## 2019-11-01 DIAGNOSIS — I2510 Atherosclerotic heart disease of native coronary artery without angina pectoris: Secondary | ICD-10-CM

## 2019-11-01 DIAGNOSIS — I25700 Atherosclerosis of coronary artery bypass graft(s), unspecified, with unstable angina pectoris: Principal | ICD-10-CM | POA: Insufficient documentation

## 2019-11-01 DIAGNOSIS — Z7982 Long term (current) use of aspirin: Secondary | ICD-10-CM | POA: Insufficient documentation

## 2019-11-01 DIAGNOSIS — D649 Anemia, unspecified: Secondary | ICD-10-CM | POA: Insufficient documentation

## 2019-11-01 DIAGNOSIS — R0600 Dyspnea, unspecified: Secondary | ICD-10-CM | POA: Insufficient documentation

## 2019-11-01 HISTORY — DX: Parkinson's disease: G20

## 2019-11-01 LAB — CBC AND DIFFERENTIAL
Absolute NRBC: 0 10*3/uL (ref 0.00–0.00)
Basophils Absolute Automated: 0.04 10*3/uL (ref 0.00–0.08)
Basophils Automated: 0.5 %
Eosinophils Absolute Automated: 0.24 10*3/uL (ref 0.00–0.44)
Eosinophils Automated: 3.2 %
Hematocrit: 36.9 % — ABNORMAL LOW (ref 37.6–49.6)
Hgb: 13 g/dL (ref 12.5–17.1)
Immature Granulocytes Absolute: 0.02 10*3/uL (ref 0.00–0.07)
Immature Granulocytes: 0.3 %
Lymphocytes Absolute Automated: 0.59 10*3/uL (ref 0.42–3.22)
Lymphocytes Automated: 7.9 %
MCH: 31.8 pg (ref 25.1–33.5)
MCHC: 35.2 g/dL (ref 31.5–35.8)
MCV: 90.2 fL (ref 78.0–96.0)
MPV: 9.1 fL (ref 8.9–12.5)
Monocytes Absolute Automated: 0.32 10*3/uL (ref 0.21–0.85)
Monocytes: 4.3 %
Neutrophils Absolute: 6.26 10*3/uL (ref 1.10–6.33)
Neutrophils: 83.8 %
Nucleated RBC: 0 /100 WBC (ref 0.0–0.0)
Platelets: 220 10*3/uL (ref 142–346)
RBC: 4.09 10*6/uL — ABNORMAL LOW (ref 4.20–5.90)
RDW: 14 % (ref 11–15)
WBC: 7.47 10*3/uL (ref 3.10–9.50)

## 2019-11-01 LAB — COMPREHENSIVE METABOLIC PANEL
ALT: <6 U/L (ref 0–55)
AST (SGOT): 18 U/L (ref 5–34)
Albumin/Globulin Ratio: 1.4 (ref 0.9–2.2)
Albumin: 4.2 g/dL (ref 3.5–5.0)
Alkaline Phosphatase: 74 U/L (ref 38–106)
Anion Gap: 9 (ref 5.0–15.0)
BUN: 19 mg/dL (ref 9–28)
Bilirubin, Total: 0.7 mg/dL (ref 0.2–1.2)
CO2: 27 mEq/L (ref 22–29)
Calcium: 10.1 mg/dL (ref 7.9–10.2)
Chloride: 103 mEq/L (ref 100–111)
Creatinine: 1.1 mg/dL (ref 0.7–1.3)
Globulin: 3.1 g/dL (ref 2.0–3.6)
Glucose: 131 mg/dL — ABNORMAL HIGH (ref 70–100)
Potassium: 4.4 mEq/L (ref 3.5–5.1)
Protein, Total: 7.3 g/dL (ref 6.0–8.3)
Sodium: 139 mEq/L (ref 136–145)

## 2019-11-01 LAB — COVID-19 (SARS-COV-2): SARS CoV 2 Overall Result: NEGATIVE

## 2019-11-01 LAB — GFR: EGFR: >60

## 2019-11-01 LAB — TROPONIN I
Troponin I: 0.01 ng/mL (ref 0.00–0.05)
Troponin I: 0.01 ng/mL (ref 0.00–0.05)
Troponin I: 0.01 ng/mL (ref 0.00–0.05)

## 2019-11-01 MED ORDER — GLUCOSE 40 % PO GEL
15.0000 g | ORAL | Status: DC | PRN
Start: 2019-11-01 — End: 2019-11-02

## 2019-11-01 MED ORDER — SODIUM CHLORIDE 0.9 % IV SOLN
INTRAVENOUS | Status: DC
Start: 2019-11-01 — End: 2019-11-01

## 2019-11-01 MED ORDER — CARBIDOPA-LEVODOPA ER 36.25-145 MG PO CPCR
3.0000 | ORAL_CAPSULE | Freq: Every day | ORAL | Status: DC
Start: 2019-11-01 — End: 2019-11-02
  Administered 2019-11-01 – 2019-11-02 (×5): 3 via ORAL
  Filled 2019-11-01 (×9): qty 3

## 2019-11-01 MED ORDER — ASPIRIN 325 MG PO TABS
325.0000 mg | ORAL_TABLET | Freq: Once | ORAL | Status: AC
Start: 2019-11-01 — End: 2019-11-01
  Administered 2019-11-01: 12:00:00 325 mg via ORAL
  Filled 2019-11-01: qty 1

## 2019-11-01 MED ORDER — ONDANSETRON HCL 4 MG/2ML IJ SOLN
4.0000 mg | Freq: Four times a day (QID) | INTRAMUSCULAR | Status: DC | PRN
Start: 2019-11-01 — End: 2019-11-02

## 2019-11-01 MED ORDER — NITROGLYCERIN 0.4 MG SL SUBL
0.4000 mg | SUBLINGUAL_TABLET | SUBLINGUAL | Status: AC | PRN
Start: 2019-11-01 — End: 2019-11-01
  Administered 2019-11-01 (×3): 0.4 mg via SUBLINGUAL
  Filled 2019-11-01: qty 25

## 2019-11-01 MED ORDER — GLUCAGON 1 MG IJ SOLR (WRAP)
1.0000 mg | INTRAMUSCULAR | Status: DC | PRN
Start: 2019-11-01 — End: 2019-11-02

## 2019-11-01 MED ORDER — ONDANSETRON HCL 4 MG/2ML IJ SOLN
4.0000 mg | Freq: Once | INTRAMUSCULAR | Status: AC
Start: 2019-11-01 — End: 2019-11-01
  Administered 2019-11-01: 13:00:00 4 mg via INTRAVENOUS
  Filled 2019-11-01: qty 2

## 2019-11-01 MED ORDER — INFLUENZA VAC A&B SA ADJ QUAD 0.5 ML IM PRSY
0.5000 mL | PREFILLED_SYRINGE | Freq: Once | INTRAMUSCULAR | Status: DC
Start: 2019-11-01 — End: 2019-11-02
  Filled 2019-11-01: qty 0.5

## 2019-11-01 MED ORDER — DEXTROSE 50 % IV SOLN
12.5000 g | INTRAVENOUS | Status: DC | PRN
Start: 2019-11-01 — End: 2019-11-02

## 2019-11-01 MED ORDER — ENOXAPARIN SODIUM 40 MG/0.4ML SC SOLN
40.0000 mg | Freq: Every day | SUBCUTANEOUS | Status: DC
Start: 2019-11-01 — End: 2019-11-02
  Administered 2019-11-01 – 2019-11-02 (×2): 40 mg via SUBCUTANEOUS
  Filled 2019-11-01 (×2): qty 0.4

## 2019-11-01 MED ORDER — LOSARTAN POTASSIUM 25 MG PO TABS
25.0000 mg | ORAL_TABLET | Freq: Every day | ORAL | Status: DC
Start: 2019-11-01 — End: 2019-11-02
  Administered 2019-11-01 – 2019-11-02 (×2): 25 mg via ORAL
  Filled 2019-11-01 (×2): qty 1

## 2019-11-01 MED ORDER — ACETAMINOPHEN 650 MG RE SUPP
650.0000 mg | Freq: Four times a day (QID) | RECTAL | Status: DC | PRN
Start: 2019-11-01 — End: 2019-11-02

## 2019-11-01 MED ORDER — SERTRALINE HCL 50 MG PO TABS
25.0000 mg | ORAL_TABLET | Freq: Every day | ORAL | Status: DC
Start: 2019-11-02 — End: 2019-11-02
  Administered 2019-11-02: 08:00:00 25 mg via ORAL
  Filled 2019-11-01: qty 1

## 2019-11-01 MED ORDER — NITROGLYCERIN 2 % TD OINT
0.5000 [in_us] | TOPICAL_OINTMENT | Freq: Once | TRANSDERMAL | Status: DC
Start: 2019-11-01 — End: 2019-11-01
  Administered 2019-11-01: 12:00:00 0.5 [in_us] via TOPICAL
  Filled 2019-11-01: qty 1

## 2019-11-01 MED ORDER — OXYBUTYNIN CHLORIDE ER 5 MG PO TB24
10.0000 mg | ORAL_TABLET | Freq: Every day | ORAL | Status: DC
Start: 2019-11-01 — End: 2019-11-02
  Administered 2019-11-01 – 2019-11-02 (×2): 10 mg via ORAL
  Filled 2019-11-01 (×2): qty 2

## 2019-11-01 MED ORDER — ATORVASTATIN CALCIUM 20 MG PO TABS
20.0000 mg | ORAL_TABLET | Freq: Every evening | ORAL | Status: DC
Start: 2019-11-01 — End: 2019-11-02
  Administered 2019-11-01: 22:00:00 20 mg via ORAL
  Filled 2019-11-01: qty 1

## 2019-11-01 MED ORDER — ALLOPURINOL 300 MG PO TABS
300.0000 mg | ORAL_TABLET | Freq: Every day | ORAL | Status: DC
Start: 2019-11-01 — End: 2019-11-02
  Administered 2019-11-01 – 2019-11-02 (×2): 300 mg via ORAL
  Filled 2019-11-01 (×2): qty 1

## 2019-11-01 MED ORDER — ONDANSETRON 4 MG PO TBDP
4.0000 mg | ORAL_TABLET | Freq: Four times a day (QID) | ORAL | Status: DC | PRN
Start: 2019-11-01 — End: 2019-11-02

## 2019-11-01 MED ORDER — SODIUM CHLORIDE 0.9 % IV SOLN
INTRAVENOUS | Status: AC
Start: 2019-11-01 — End: 2019-11-02

## 2019-11-01 MED ORDER — PANTOPRAZOLE SODIUM 40 MG IV SOLR
40.0000 mg | Freq: Once | INTRAVENOUS | Status: AC
Start: 2019-11-01 — End: 2019-11-01
  Administered 2019-11-01: 12:00:00 40 mg via INTRAVENOUS
  Filled 2019-11-01: qty 40

## 2019-11-01 MED ORDER — NALOXONE HCL 0.4 MG/ML IJ SOLN (WRAP)
0.2000 mg | INTRAMUSCULAR | Status: DC | PRN
Start: 2019-11-01 — End: 2019-11-02

## 2019-11-01 MED ORDER — NITROGLYCERIN 0.4 MG SL SUBL
0.4000 mg | SUBLINGUAL_TABLET | SUBLINGUAL | Status: DC | PRN
Start: 2019-11-01 — End: 2019-11-02

## 2019-11-01 MED ORDER — GLUCAGON 1 MG IJ SOLR (WRAP)
1.0000 mg | INTRAMUSCULAR | Status: DC | PRN
Start: 2019-11-01 — End: 2019-11-01

## 2019-11-01 MED ORDER — MELATONIN 3 MG PO TABS
3.0000 mg | ORAL_TABLET | Freq: Every evening | ORAL | Status: DC | PRN
Start: 2019-11-01 — End: 2019-11-02

## 2019-11-01 MED ORDER — ASPIRIN 81 MG PO TBEC
81.0000 mg | DELAYED_RELEASE_TABLET | Freq: Every day | ORAL | Status: DC
Start: 2019-11-01 — End: 2019-11-02
  Administered 2019-11-01 – 2019-11-02 (×2): 81 mg via ORAL
  Filled 2019-11-01 (×2): qty 1

## 2019-11-01 MED ORDER — ACETAMINOPHEN 325 MG PO TABS
650.0000 mg | ORAL_TABLET | Freq: Four times a day (QID) | ORAL | Status: DC | PRN
Start: 2019-11-01 — End: 2019-11-02

## 2019-11-01 NOTE — Consults (Signed)
[facility] CONSULTATION REPORT  [facility]    Date Time: 11/01/19 3:15 PMPatient Name: [name] [name],[name] [name]  Requesting Physician: Willia Craze, MD       Reason for Consultation:   Chest pain      History:   [name] [name] is a 79 y.o. male admitted on 11/01/2019.  We have been asked by Willia Craze, MD,  to provide cardiac consultation, regarding chest pain.  He has a history of coronary artery bypass graft surgery in 2006 and used to live in [place].  Using the care everywhere application I was able to find that his bypass anatomy was a LIMA to mid LAD, saphenous vein graft to first diagonal, saphenous vein graft to OM1, and saphenous vein graft to the distal LAD.    From a cardiac standpoint he has done very well through the years.  His most recent LDL cholesterol was 97 a few months ago.    His son is at bedside and helps with the history.  The patient is here because he had a protracted spell of chest discomfort today different from his usual GERD symptoms, which are relieved by throwing up.  He did not vomit today.  He felt a little dyspneic and was noted to be so when he was put in the car to be brought here to the ER according to his son.  He is currently completely asymptomatic and he tells me he got nitroglycerin in the ER and it took a little while but it completely relieved his symptoms.    He has had the intermittent resting chest pain presumed to be GERD for the last 3 years or so when it occurs quite regularly.  He denies exertional symptoms whatsoever and he thinks his last Lexiscan MPI was about 3 years ago in [place].  He moved up here early this year.  He had regular cardiology visits there about yearly at [facility] and also at [facility].  I was able to review the visits and he was not complaining of angina.    No recent syncope, dizziness, PND, orthopnea, pedal edema, calf tenderness, palpitations, nor exertional symptoms.  He has  Parkinson's disease and it is reasonably controlled but he has a significant tremor.  He does not have problems with postural hypotension.          Past Medical History:     Past Medical History:   Diagnosis Date   . Parkinsons        Past Surgical History:     Past Surgical History:   Procedure Laterality Date   . CHOLECYSTECTOMY  1990s   . CORONARY ARTERY BYPASS GRAFT  1990s   . FEMORAL BYPASS  1994       Family History:   History reviewed. No pertinent family history.    Social History:     Social History     Socioeconomic History   . Marital status: Unknown     Spouse name: Not on file   . Number of children: Not on file   . Years of education: Not on file   . Highest education level: Not on file   Occupational History   . Not on file   Tobacco Use   . Smoking status: Never Smoker   . Smokeless tobacco: Never Used   Vaping Use   . Vaping Use: Never used   Substance and Sexual Activity   . Alcohol use: Not Currently   . Drug use:  Never   . Sexual activity: Not on file   Other Topics Concern   . Not on file   Social History Narrative   . Not on file     Social Determinants of Health     Financial Resource Strain:    . Difficulty of Paying Living Expenses:    Food Insecurity:    . Worried About Programme researcher, broadcasting/film/video in the Last Year:    . Barista in the Last Year:    Transportation Needs:    . Freight forwarder (Medical):    Marland Kitchen Lack of Transportation (Non-Medical):    Physical Activity:    . Days of Exercise per Week:    . Minutes of Exercise per Session:    Stress:    . Feeling of Stress :    Social Connections:    . Frequency of Communication with Friends and Family:    . Frequency of Social Gatherings with Friends and Family:    . Attends Religious Services:    . Active Member of Clubs or Organizations:    . Attends Banker Meetings:    Marland Kitchen Marital Status:    Intimate Partner Violence:    . Fear of Current or Ex-Partner:    . Emotionally Abused:    Marland Kitchen Physically Abused:    . Sexually Abused:         Allergies:   No Known Allergies    Medications:     Medications Prior to Admission   Medication Sig   . allopurinol (ZYLOPRIM) 300 MG tablet TAKE 1 TABLET BY MOUTH EVERY DAY   . aspirin EC 81 MG EC tablet Take 81 mg by mouth daily      . atorvastatin (LIPITOR) 20 MG tablet Take 20 mg by mouth nightly   . carbidopa-levodopa (Rytary) 36.25-145 MG Cap CR per capsule Take 3 capsules by mouth every 4 (four) hours At 0800, 1200, 1600, 2000, 0000   . losartan (COZAAR) 25 MG tablet TAKE 1 TABLET BY MOUTH EVERY DAY   . oxybutynin (DITROPAN-XL) 10 MG 24 hr tablet Take 1 tablet (10 mg total) by mouth daily   . sertraline (ZOLOFT) 25 MG tablet TAKE 1 TABLET BY MOUTH EVERY DAY   . Ascorbic Acid (VITAMIN C PO) Take 1,000 mg by mouth   . Coenzyme Q10 (CoQ10) 200 MG Cap Take 400 mg by mouth   . glucosamine-chondroitin 500-400 MG tablet Take 1 tablet by mouth   . [DISCONTINUED] Multiple Vitamins-Minerals (CENTRUM SILVER 50+MEN PO) Take 1 tablet by mouth         Current Facility-Administered Medications   Medication Dose Route Frequency Provider Last Rate Last Admin   . allopurinol (ZYLOPRIM) tablet 300 mg  300 mg Oral Daily Alphonzo Dublin, MD       . aspirin EC tablet 81 mg  81 mg Oral Daily Alphonzo Dublin, MD       . atorvastatin (LIPITOR) tablet 20 mg  20 mg Oral QHS Alphonzo Dublin, MD       . carbidopa-levodopa (RYTARY) 36.25-145 MG CR capsule 3 capsule  3 capsule Oral Q4H Alphonzo Dublin, MD       . dextrose (GLUCOSE) 40 % oral gel 15 g of glucose  15 g of glucose Oral PRN Vanessa Ralphs, MD        And   . dextrose 50 % bolus 12.5 g  12.5 g Intravenous PRN Vanessa Ralphs, MD  And   . glucagon (rDNA) (GLUCAGEN) injection 1 mg  1 mg Intramuscular PRN Damon, Olene Floss, MD       . enoxaparin (LOVENOX) syringe 40 mg  40 mg Subcutaneous Daily Damon, Olene Floss, MD       . influenza adjuvanted quadrivalent vaccine (PF) (FLUAD) IM injection (age 26 & greater) 0.5 mL  0.5 mL Intramuscular Once Latif, Aamir A, MD       . losartan (COZAAR)  tablet 25 mg  25 mg Oral Daily Lam, Red Christians, MD       . melatonin tablet 3 mg  3 mg Oral QHS PRN Vanessa Ralphs, MD       . naloxone [facility]) injection 0.2 mg  0.2 mg Intravenous PRN Vanessa Ralphs, MD       . oxybutynin XL (DITROPAN-XL) 24 hr tablet 10 mg  10 mg Oral Daily Alphonzo Dublin, MD       . sertraline (ZOLOFT) tablet 25 mg  25 mg Oral Daily Alphonzo Dublin, MD             Review of Systems:    Comprehensive review of systems including constitutional, eyes, ears, nose, mouth, throat, cardiovascular, GI, GU, musculoskeletal, integumentary, respiratory, neurologic, psychiatric, and endocrine is negative other than what is mentioned already in the history of present illness    Physical Exam:     Vitals:    11/01/19 1438   BP: 119/63   Pulse: 80   Resp: 17   Temp: 97.7 F (36.5 C)   SpO2: 98%     Temp (24hrs), Avg:98.2 F (36.8 C), Min:97.7 F (36.5 C), Max:98.5 F (36.9 C)      Intake and Output Summary (Last 24 hours) at Date Time  No intake or output data in the 24 hours ending 11/01/19 1515    GENERAL: Patient is in no acute distress   HEENT: No scleral icterus or conjunctival pallor, moist mucous membranes   NECK: No jugular venous distention or thyromegaly, normal carotid upstrokes without bruits   CARDIAC: Normal apical impulse, regular rate and rhythm, with normal S1 and S2, and no murmurs, rubs, or gallops   CHEST: Clear to auscultation bilaterally, normal respiratory effort  ABDOMEN: Nondistended and soft  EXTREMITIES: No clubbing, cyanosis, or edema, 2+ DP, PT pulses bilaterally   SKIN: No rash or jaundice   NEUROLOGIC: Alert and oriented to time, place and person, normal mood and affect, tremor of Parkinson syndrome  MUSCULOSKELETAL: Normal muscle strength and tone.      Labs Reviewed:     Recent Labs   Lab 11/01/19  1116   Troponin I 0.01             Recent Labs   Lab 11/01/19  1116   Bilirubin, Total 0.7   Protein, Total 7.3   Albumin 4.2   ALT <6   AST (SGOT) 18             Recent Labs   Lab  11/01/19  1116   WBC 7.47   Hgb 13.0   Hematocrit 36.9*   Platelets 220     Recent Labs   Lab 11/01/19  1116   Sodium 139   Potassium 4.4   Chloride 103   CO2 27   BUN 19   Creatinine 1.1   EGFR >60.0   Glucose 131*   Calcium 10.1         Radiology   Radiological Procedure reviewed.  chest X-ray  Assessment:    Chest pain, atypical.  I cannot rule out unstable angina at this time but his initial troponin was normal and his EKG looked benign.  We certainly should keep him for serial troponins tonight and telemetry, but if he is asymptomatic tomorrow we may be able to discharge him on GI medication for an outpatient Lexiscan MPI, which he prefers to staying longer for [facility] tomorrow, unless the clinical picture suggests we should do more as an inpatient.  If his troponins turn positive we could discuss cardiac catheterization versus conservative management.  He has a long history of GI related chest pain which is relieved by vomiting.   Status post CABG in 2006 with LIMA to LAD, saphenous vein graft to OM1, saphenous vein graft to first diagonal, and saphenous vein graft to distal LAD.   History of GERD   Dyslipidemia with ideal LDL lowering target of 55 or less-most recently 97.   Parkinson's syndrome   Urinary retention history   Gout    Recommendations:    Serial troponins tonight   Continue his home medications for his heart   Add Zetia 10 mg daily for better LDL lowering with a target LDL of about 55 or better for secondary prevention in this patient.  His last LDL was 97.  I will leave adding the Zetia to the primary care team if they agree.            Signed by: Harvel Quale, MD    [facility]  APP Spectralink [phone] (8am-12pm)  MD Spectralink [phone] (8am-5pm)  After hours, non urgent consult line [phone]  After Hours, urgent consults or to reach the on-call MD [phone]

## 2019-11-01 NOTE — Progress Notes (Signed)
Admission Note-   Patient admitted to PCU #  [id number] at 1430hrs. Hand off report received from Epic report sheet.    Patient connected to tele monitor, confirmed with monitor tech.    Patient oriented to room, bed in lowest position, bed alarm activated, call bell within reach.    Fall prevention contract completed. Yes/No   Patient informed of visitor policy.   Belongings underwear.     Brief Clinical Picture:  Neuro AXOx3, forgetful; Resp - O2 status - RA; Cardiac NSR; Foley or Central lines - n/a; GI.     Skin Assessment  Two nurse skin assessment performed with: Ladona Ridgel RN    Areas observed with redness or injury: Non-blanching redness on b/l buttocks, scar on chest    Devices present: n/a skin integrity under device: n/a    Braden score <15No    Actions taken: Mepilex and barrier cream, monitoring for incontinence    Any significant admission event: n/a

## 2019-11-01 NOTE — Discharge Instr - AVS First Page (Addendum)
[facility] Inpatient Service  Discharge Instructions for  Demetrice Combes    Date Printed: 11/01/19  Primary Doctor's Name: [name], MD, Fax: [phone] Phone: [phone]    Follow up time frame: 3-5 days    Subspecialists: Cardiology, Follow up earliest appointment available    Please bring this plan and all your medications to your follow up appointments and any future hospital visits    Diagnosis: Unstable Angina    Medications:   Please take your medication as instructed in the AVS.  Changes to home medications: Increase Lipitor from 20mg  to 40mg   New medications prescribed: Protonix 40mg  for GERD (Reflux)    Special Instructions: Please follow up with cardiologist at earliest available appointment for follow up and to schedule an outpatient Lexiscan stress test. We are also increasing your lipid medication (Lipitor) to a higher dosage with the goal of decreasing your LDL cholesterol below 70.     We are prescribing you Protonix for reflux. We did a video swallow study which will give [place] more information as to why you have ben having difficulty swallowing and frequent vomiting.    Please return to the ED if you have squeezing chest pain behind the sternum, pain radiating down the arm, shortness of breath, or any similar symptoms that brought you in to the emergency department on 11/01/19.      [facility]  [phone]  [address], [address]  [place], [place] [address]

## 2019-11-01 NOTE — Plan of Care (Signed)
[facility] Resident Note  Office: [phone]      Evaluated patient at admission with Dr. Shayne Alken, I agree with her A/P as outlined in full H&P note. Pertinent elements of history and exam independently reproduced below.    CC: chest pain    HPI: 79 y.o. male with known CAD s/p CABG x4 (1990s), hx femoral bypass (1990s), Parkinson's DM2, depression, HTN, HLD, pulmonary nodule, presenting with chest pain.    Pt able to provide history with assistance of his son, with whom he lives.    Yesterday he had a 15 minute episode of squeezing chest pressure while watching TV, which resolved spontaneously. This morning he had another episode of the chest pressure this time associated with nausea and diaphoresis. He tried taking Rolaids with no relief, so he came to the ED. Son notes pt was a little more out of breath today walking to and from the car, but pt has not had SOB prior to today.    Pt notes similar sensation 4-5 years ago.    Has not seen a cardiologist in years.    Pt is undergoing dysphagia work up. Has been having difficulty swallowing for about a year.    In the ED pt was given aspirin and nitro with significant improvement in chest pain. Vital signs within normal range. First troponin negative. CXR negative.    PE:   Gen: well-appearing, NAD  HEENT: EOMI, PERRL, MMM   CV: RRR, no murmurs, rubs, or gallops  Lungs: breathing comfortably on RA, CTAB, no wheezes, rales, or rhonchi  Ext: warm, distal pulses intact, no LE edema  Neuro: awake and alert, no focal deficits, coarse resting tremor BL UE  Skin: no rashes or lesions noted    A/P: 79 y.o. male with known CAD s/p CABG x4 (1990s), hx femoral bypass (1990s), Parkinson's DM2, depression, HTN, HLD, pulmonary nodule, presenting with chest pain concerning for ACS likely unstable angina, vs dysphagia/esophageal spasm vs gerd.  -Admit to telemetry  -Repeat trop  -[facility] consulted by ED  -Nitro prn  -SLP eval  -Statin  -Protonix      Vanessa Ralphs,  MD  [facility], PGY-3

## 2019-11-01 NOTE — H&P (Signed)
[name] Admission H&P  Physician Available 24 hours a day:  Please use number in sticky note to contact hospital team.  Office#: [phone]       Date Time: 11/01/19 6:26 PM  Patient Name: [name] [name],[name] [name]  Attending Physician: Willia Craze, MD  Primary Care Physician: [name], MD    CC: Chest pain       Assessment:     Active Hospital Problems    Diagnosis   . Chest pain     79 y.o. 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     Plan:   # Chest Pain   # Unstable Angina   History of 4-vessel CABG, femoral bypass, and Parkinson's; Heart Score 6. Given holosystolic   - EKG as needed   - follow-up troponin x3 , every 3 hours   - Cardiology consult - [facility]   - Lipitor 20 mg    # GERD  - Protonix 40 mg daily     #Dysphagia and Odynophagia  - SLP suggested video swallow study tomorrow  - NPO except for meds with sips     #LUQ abdominal pain  - Serial abdominal exams   - f/u Lipase in AM     Chronic:   Parkinson's: Continue Rytary   Urinary retention: continue oxybutynin 10 mg    Gout: Allopurinol 300 mg    Depression: Zoloft 25 mg daily     #FEN/GI   - Fluid: 50 cc/hr to end oat 0800 tomorrow   - Diet: NPO, video swallow study   - Electrolytes: K >4, Mg >2    #PPX  - DVT: Lovenox   - GI: Protonix     CODE STATUS: Full     DISPO:  Based on the information available on the day of this admission, patient will be admitted to service status:  Observation:patient is stable and improving. due to unstable angina   Anticipated medical stability for discharge:Yellow - maybe tomorrow  Anticipated discharge needs: cardiac monitoring     History of Presenting Illness:   [name] [name] is a 79 y.o. 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    Yesterday 15 minute episode chest  pressure/squeezing watching TV, resolved on its own; no associated nausea, no radiation down arm or into back.  Was able to sleep through the night, but at 1030 am, while watching TV, had repeated chest tightness. Accompanied with nausea (no vomiting) and sweating. Tried rolaids, but had no relief, so came to ED.     No associated lightheadedness or dizziness. Son notes pt a little out of breath walking to and from car today. Went for a walk yesterday and felt normal, no SOB    No fevers or chills, diarrhea, abd pain, LE swelling  4-5 years ago similar sensation.   Last time saw cardiologist years ago, most recent one was in [place]  Had echo and stress test, things looked ok per pt  Currently no chest pain, no trouble breathing    Trying to get modified barium swallow test outpatient for a year of dysphagia.  Felt like a lot of the pain he has been having have been with the difficulty swallowing, so didn't think about the heart last night. More chest pain when trying to swallow an  aspirin this morning around 11am    Never smoker, no alcohol  Takes multivitamin, fiber capsule  Oxybutynin is only new medication    Lives with son. Ambulatory independently.    ED COURSE:   EKG showed no ST changes. Troponin neg x1. Given Aspirin 325 mg and also nitroglycerin which alleviated symptoms.     SPECIALISTS:   Neurologist (Parkinson's)   Urologist       Past Medical History:     Past Medical History:   Diagnosis Date   . Parkinsons        Past Surgical History:     Past Surgical History:   Procedure Laterality Date   . CHOLECYSTECTOMY  1990s   . CORONARY ARTERY BYPASS GRAFT  1990s   . FEMORAL BYPASS  1994       Family History:   History reviewed. No pertinent family history.    Social History:     Social History     Socioeconomic History   . Marital status: Unknown     Spouse name: Not on file   . Number of children: Not on file   . Years of education: Not on file   . Highest education level: Not on file   Occupational History    . Not on file   Tobacco Use   . Smoking status: Never Smoker   . Smokeless tobacco: Never Used   Vaping Use   . Vaping Use: Never used   Substance and Sexual Activity   . Alcohol use: Not Currently   . Drug use: Never   . Sexual activity: Not on file   Other Topics Concern   . Not on file   Social History Narrative   . Not on file     Social Determinants of Health     Financial Resource Strain:    . Difficulty of Paying Living Expenses:    Food Insecurity:    . Worried About Programme researcher, broadcasting/film/video in the Last Year:    . Barista in the Last Year:    Transportation Needs:    . Freight forwarder (Medical):    Marland Kitchen Lack of Transportation (Non-Medical):    Physical Activity:    . Days of Exercise per Week:    . Minutes of Exercise per Session:    Stress:    . Feeling of Stress :    Social Connections:    . Frequency of Communication with Friends and Family:    . Frequency of Social Gatherings with Friends and Family:    . Attends Religious Services:    . Active Member of Clubs or Organizations:    . Attends Banker Meetings:    Marland Kitchen Marital Status:    Intimate Partner Violence:    . Fear of Current or Ex-Partner:    . Emotionally Abused:    Marland Kitchen Physically Abused:    . Sexually Abused:        Allergies:   No Known Allergies    Medications:     Current Discharge Medication List      CONTINUE these medications which have NOT CHANGED    Details   allopurinol (ZYLOPRIM) 300 MG tablet TAKE 1 TABLET BY MOUTH EVERY DAY  Qty: 30 tablet, Refills: 0    Associated Diagnoses: Gout, unspecified cause, unspecified chronicity, unspecified site      aspirin EC 81 MG EC tablet Take 81 mg by mouth daily  atorvastatin (LIPITOR) 20 MG tablet Take 20 mg by mouth nightly      carbidopa-levodopa (Rytary) 36.25-145 MG Cap CR per capsule Take 3 capsules by mouth every 4 (four) hours At 0800, 1200, 1600, 2000, 0000      losartan (COZAAR) 25 MG tablet TAKE 1 TABLET BY MOUTH EVERY DAY  Qty: 90 tablet, Refills: 4    Associated  Diagnoses: Essential hypertension      oxybutynin (DITROPAN-XL) 10 MG 24 hr tablet Take 1 tablet (10 mg total) by mouth daily  Qty: 30 tablet, Refills: 6      sertraline (ZOLOFT) 25 MG tablet TAKE 1 TABLET BY MOUTH EVERY DAY  Qty: 90 tablet, Refills: 1    Associated Diagnoses: Type 2 diabetes mellitus without complication, without long-term current use of insulin      Ascorbic Acid (VITAMIN C PO) Take 1,000 mg by mouth      Coenzyme Q10 (CoQ10) 200 MG Cap Take 400 mg by mouth      glucosamine-chondroitin 500-400 MG tablet Take 1 tablet by mouth              Review of Systems:   All other systems were reviewed and are negative except: per HPI    Physical Exam:     VITAL SIGNS: Physical Exam:   Temp:  [97.7 F (36.5 C)-98.5 F (36.9 C)] 97.7 F (36.5 C)  Heart Rate:  [78-85] 80  Resp Rate:  [17-20] 17  BP: (113-167)/(57-81) 121/68  Body mass index is 25.31 kg/m.  No intake or output data in the 24 hours ending 11/01/19 1826 GEN: alert and oriented x 3; NAD  HEENT: PERRL, EOMI, sclera anicteric, oropharynx clear without lesions, MMM. No JVD  CARDS: RRR, holosystolic murmur best heard in aortic spot.  No reproducible tenderness on chest.   LUNGS: clear to auscultation bilaterally, without wheezing, rhonchi, or rales  ABD: LUQ pain on palpation.   EXT: no clubbing, cyanosis, or lower extremity edemabilaterally   NEURO: tremor in upper extremity bilaterally. Cogwheel rigidity R>L .   SKIN: no rashes or lesions noted             Labs Reviewed:     Results     Procedure Component Value Units Date/Time    Troponin I [[id number]] Collected: 11/01/19 1608    Specimen: Blood Updated: 11/01/19 1728     Troponin I 0.01 ng/mL     COVID-19 (SARS-COV-2) ([place] [facility]) [[id number]] Collected: 11/01/19 1151    Specimen: Nasopharyngeal Swab from Nasopharynx Updated: 11/01/19 1217     Purpose of COVID testing Screening     SARS-CoV-2 Specimen Source Nasopharyngeal     SARS CoV 2 Overall Result Negative    Narrative:      o Collect and  clearly label specimen type:  o Upper respiratory specimen: One Nasopharyngeal Dry Swab NO  Transport Media.  o Hand deliver to laboratory ASAP  Indication for testing->Extended care facility admission to  semi private room  Screening    Troponin I [[id number]] Collected: 11/01/19 1116    Specimen: Blood Updated: 11/01/19 1143     Troponin I 0.01 ng/mL     Comprehensive metabolic panel [[id number]]  (Abnormal) Collected: 11/01/19 1116    Specimen: Blood Updated: 11/01/19 1141     Glucose 131 mg/dL      BUN 19 mg/dL      Creatinine 1.1 mg/dL      Sodium 629 mEq/L      Potassium 4.4 mEq/L  Chloride 103 mEq/L      CO2 27 mEq/L      Calcium 10.1 mg/dL      Protein, Total 7.3 g/dL      Albumin 4.2 g/dL      AST (SGOT) 18 U/L      ALT <6 U/L      Alkaline Phosphatase 74 U/L      Bilirubin, Total 0.7 mg/dL      Globulin 3.1 g/dL      Albumin/Globulin Ratio 1.4     Anion Gap 9.0    GFR [[id number]] Collected: 11/01/19 1116     Updated: 11/01/19 1141     EGFR >60.0    CBC and differential [[id number]]  (Abnormal) Collected: 11/01/19 1116    Specimen: Blood Updated: 11/01/19 1128     WBC 7.47 x10 3/uL      Hgb 13.0 g/dL      Hematocrit 78.2 %      Platelets 220 x10 3/uL      RBC 4.09 x10 6/uL      MCV 90.2 fL      MCH 31.8 pg      MCHC 35.2 g/dL      RDW 14 %      MPV 9.1 fL      Neutrophils 83.8 %      Lymphocytes Automated 7.9 %      Monocytes 4.3 %      Eosinophils Automated 3.2 %      Basophils Automated 0.5 %      Immature Granulocytes 0.3 %      Nucleated RBC 0.0 /100 WBC      Neutrophils Absolute 6.26 x10 3/uL      Lymphocytes Absolute Automated 0.59 x10 3/uL      Monocytes Absolute Automated 0.32 x10 3/uL      Eosinophils Absolute Automated 0.24 x10 3/uL      Basophils Absolute Automated 0.04 x10 3/uL      Immature Granulocytes Absolute 0.02 x10 3/uL      Absolute NRBC 0.00 x10 3/uL           Imaging Reviewed:     Chest AP Portable    Result Date: 11/01/2019   No evidence of acute disease. Stephannie Peters, MD   11/01/2019 11:55 AM    Signed by: Alphonzo Dublin, MD  Office #: [phone]     HQ:[email or web address], Pryor Ochoa, MD

## 2019-11-01 NOTE — SLP Eval Note (Signed)
[facility]   Speech and Language Therapy Swallow Evaluation     Attention Physicians: For patients in an observation or outpatient status, Medicare requires the ordering provider to certify that this service is medically necessary.  Please co-sign this evaluation to indicate your agreement.  Thank you.      Patient: [name] [name]    MRN#: [id number]   Consult received for [name] [name] for SLP Swallow Evaluation and Treatment.  Treatment Time: Start Time: 1430 to Stop Time: 1455    Assessment:    Patient demonstrates with functional swallowing during the oral and pharyngeal phases of the swallow.  No clinical signs or symptoms of aspiration noted.   Pt DOES report, however, a "squeezing" sensation in his chest area after only 3-4 oz of PO (after graham cracker). Pt reports this sensation has been a daily occurrence with PO and has worsened lately. Pt would benefit from a VFSS with esophageal sweep with all consistencies vs esophagram as pt reports this occurs more with solids vs liquids.  Plan/Recommendation:    Diet recommendation: regular with thin liquid (pt was told to limit meal to light meal for now-ie soups)  medication administration: whole with puree  VFSS tomorrow with eospahgeal sweep of thin, puree, solid     SLP Frequency Recommended: 2-3x/wk  Duration of Treatment: 5 visits  to ensure diet tolerance       Goals:       Goals  Patient will demonstrate adequate oropharyngeal phase skills for a: regular, thin liquids, without overt s/s of aspiration 10/10 boluses   Pt will participate in VFSS to further assess swallow   Patient will tolerate least restrictive diet without difficulty or signs/symptoms of aspiration.   Education:    Educated the patient/son to role of speech therapy, recommended diet,  and results of eval.   Patient/son demonstrated good understanding. Will benefit from continued observation of swallow to ensure diet tolerance    Discussed plan of care with patient, in agreement  with plan.   Interdisciplinary Communication:    Communicated with RN regarding rec diet    Evaluation:   Medical Diagnosis: Chest pain [R07.9]  Therapy Diagnosis: dysphagia  PPE worn: procedural mask, goggles  and gloves     History of Present Illness: [name] [name] is a 79 y.o. male admitted on 11/01/2019     Past Medical/Surgical History:  Patient Active Problem List   Diagnosis   . Parkinson's disease   . Diabetes mellitus type 2, controlled   . Mild episode of recurrent major depressive disorder   . Neutrophilic leukocytosis   . Low back pain   . Hypertension   . Hepatic cyst   . Dyslipidemia   . Gouty arthropathy   . Diffuse esophageal spasm   . Depression   . CAD (coronary artery disease)   . Anosmia   . Benign essential hypertension   . Angina pectoris   . Nocturia   . Optic neuritis   . Other and unspecified hyperlipidemia   . Pulmonary nodule, left   . S/P CABG (coronary artery bypass graft)   . Shoulder joint pain   . Tremor   . Ureteric stone   . Urge incontinence   . Chest pain     Past Medical History:   Diagnosis Date   . Parkinsons       Past Surgical History:   Procedure Laterality Date   . CHOLECYSTECTOMY  1990s   . CORONARY ARTERY BYPASS GRAFT  1990s   . FEMORAL BYPASS  1994         Subjective: Patient is agreeable to participation in the therapy session. Patient's medical condition is appropriate for Speech therapy intervention at this time.    Pt lives with son. Dx with PD 2-3 yrs ago. Is indep with ambulation, dressing, feeding. Ox x4.   Pt reports issues with sensation of pressure/ "squeezing" in chest area with foods/ liquids, which occurs daily. This lasts for 3-5 min.   He reports vomiting up phlegm approx 20 min after eating. Pt reports "everything comes up" if he leans forward after eating.    This am he experienced this squeezing more intense and for longer period.    Son had been trying to get a VFSS scheduled prior to admission today.        OBJECTIVE:    Oral Motor Skills:   Grossly WFL  for labial and lingual ROM, symmetry, coordination, and strength. Pt has appropriate dentition.      Swallowing Skills:  Items assessed:  ice chips, puree, solids-graham cracker, thin liquid via cup: 4-5 oz.      Oral Phase:  Good labial closure around tsp to retrieve bolus, Good oral containment, Good mastication, No oral residue. Prompt initiation of swallow    Pharyngeal Phase:   Triggers swallow reflex in 1-2 seconds,  laryngeal rise felt on palpation indicating pharyngeal swallow response, No cough/throat clear, No change in vocal quality.    Pt reports the "squeezing" sensation after only 3-4 oz of PO (after graham cracker). Liquid did not help sensation.  Subsided after a few min    Blondell Reveal, MS, CCC-SLP    [place] # [phone]      For Reference: Hospital Approved Diet Consistency Selection Protocol    1. Overall Assessment of Patient  Symptoms Observed Diet Consistency to be Ordered   Level of alertness not safe for po intake NPO *Must place call to provider    Clinical signs/symptoms of aspiration with all textures NPO *Must place call to provider     2. Assessment of Liquids  Symptoms Observed Liquid Consistency to be Ordered   No difficulty with liquids Thin   Clinical signs/symptoms of aspiration with thin liquid Nectar   Clinical signs/symptoms of aspiration with nectar liquid Honey   Clinical signs/symptoms of aspiration with honey liquid Spoon-thick     3. Assessment of Solids:  Symptoms Observed Solid Consistency to be Ordered   No difficulty Regular   Difficulty with mastication of solids Mechanical Soft   Slow, but effective mastication of soft solids; and difficulty with harder solids Dysphagia Mechanical Altered   Oral residuals present, unable to masticate soft solids Puree     4.  Special Considerations:   a. When the patient has past medical history which includes chronic conditions with dietary considerations such as diabetes mellitus, congestive heart failure or renal failure, the SLP  will discuss/verify additional dietary restrictions with registered dietician or nurse.   b. When palliative care is involved, the recommended consistencies vs. patient wishes will be discussed with the care team.     5.  Follow-up: Recommended consistencies are re-assessed frequently during the patient's hospitalization as the patient's condition and/or other factors influencing the recommendations may change.

## 2019-11-01 NOTE — ED Provider Notes (Signed)
[facility]    EMERGENCY DEPARTMENT HISTORY AND PHYSICAL EXAM    Date: 11/01/2019  Patient Name: [name] [name],[name] [name]  Attending Physician: No att. providers found  Patient DOB:  10-01-1940  MRN:  [id number]  Room:  V409/W119-14    Patient was evaluated by ED physician, Dr Lourdes Sledge, at 11:40 AM    History     Chief Complaint   Patient presents with   . Chest Pain          The patient [name] [name], is a 79 y.o. 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  He notes this midsternal heaviness without radiation with nausea and diaphoresis according to his son.  There is no vomiting.  There is no shortness of breath.  There is no cough congestion runny nose.  No fever.  No lower extremity changes.  He has had both his Covid vaccines.  He notes that yesterday he had a 15-minute episode of chest discomfort that resolved on its own.    PCP:  [name], MD      Past Medical History       Past Medical History:   Diagnosis Date   . Parkinsons          Past Surgical History       Past Surgical History:   Procedure Laterality Date   . CHOLECYSTECTOMY  1990s   . CORONARY ARTERY BYPASS GRAFT  1990s   . FEMORAL BYPASS  1994         Family History    History reviewed. No pertinent family history.    Social History    Social History     Socioeconomic History   . Marital status: Unknown     Spouse name: None   . Number of children: None   . Years of education: None   . Highest education level: None   Occupational History   . None   Tobacco Use   . Smoking status: Never Smoker   . Smokeless tobacco: Never Used   Vaping Use   . Vaping Use: Never used   Substance and Sexual Activity   . Alcohol use: Not Currently   . Drug use: Never   . Sexual activity: None    Other Topics Concern   . None   Social History Narrative   . None     Social Determinants of Health     Financial Resource Strain:    . Difficulty of Paying Living Expenses:    Food Insecurity:    . Worried About Programme researcher, broadcasting/film/video in the Last Year:    . Barista in the Last Year:    Transportation Needs:    . Freight forwarder (Medical):    Marland Kitchen Lack of Transportation (Non-Medical):    Physical Activity:    . Days of Exercise per Week:    . Minutes of Exercise per Session:    Stress:    . Feeling of Stress :    Social Connections:    . Frequency of Communication with Friends and Family:    . Frequency of Social Gatherings with Friends and Family:    . Attends Religious Services:    .  Active Member of Clubs or Organizations:    . Attends Banker Meetings:    Marland Kitchen Marital Status:    Intimate Partner Violence:    . Fear of Current or Ex-Partner:    . Emotionally Abused:    Marland Kitchen Physically Abused:    . Sexually Abused:        Allergies    No Known Allergies      Current/Home Medications    Discharge Medication List as of 11/02/2019 11:01 AM      CONTINUE these medications which have NOT CHANGED    Details   allopurinol (ZYLOPRIM) 300 MG tablet TAKE 1 TABLET BY MOUTH EVERY DAY, E-Rx      Ascorbic Acid (VITAMIN C PO) Take 1,000 mg by mouth, Historical Med      aspirin EC 81 MG EC tablet Take 81 mg by mouth daily   , Historical Med      carbidopa-levodopa (Rytary) 36.25-145 MG Cap CR per capsule Take 3 capsules by mouth every 4 (four) hours At 0800, 1200, 1600, 2000, 0000, Historical Med      Coenzyme Q10 (CoQ10) 200 MG Cap Take 400 mg by mouth, Historical Med      glucosamine-chondroitin 500-400 MG tablet Take 1 tablet by mouth, Historical Med      losartan (COZAAR) 25 MG tablet TAKE 1 TABLET BY MOUTH EVERY DAY, E-Rx      oxybutynin (DITROPAN-XL) 10 MG 24 hr tablet Take 1 tablet (10 mg total) by mouth daily, Starting Tue 10/17/2019, Until Wed 10/16/2020, E-Rx      sertraline (ZOLOFT) 25 MG tablet TAKE 1  TABLET BY MOUTH EVERY DAY, E-Rx             Vital Signs     BP 130/65   Pulse 62   Temp 97.4 F (36.3 C) (Oral)   Resp 16   Wt 82.3 kg   SpO2 98%   BMI 25.31 kg/m   No data found.      Review of Systems     Review of Systems   Constitutional: Positive for diaphoresis. Negative for fever.   Respiratory: Negative.    Cardiovascular: Positive for chest pain.   Gastrointestinal: Positive for nausea.   Musculoskeletal: Negative.    All other systems reviewed and are negative.      Physical Exam          CONSTITUTIONAL   Vital signs reviewed, Patient appears comfortable, Alert and oriented X 3.  HEAD   Atraumatic, Normocephalic.  EYES   Eyes are normal to inspection, Pupils equal, round and reactive to light, No discharge from eyes, Extraocular muscles intact, Sclera are normal, Conjunctiva normal.  ENT Mouth normal to Inspection.  NECK   Normal ROM. No jugular venous distention, No meningeal signs.  RESPIRATORY Chest is nontender, Breath sounds normal, No respiratory distress  CARDIOVASCULAR   RRR, No murmurs, Normal S1 S2, Rhythm is normal.  ABDOMEN   Abdomen is nontender, No masses, Bowel sounds normal, No distension, No peritoneal signs.  BACK  There is no CVA Tenderness, Normal inspection.  UPPER EXTREMITY   Inspection normal, No cyanosis, No edema.  LOWER EXTREMITY   Inspection normal, No cyanosis, No edema.  NEURO   GCS is 15, Speech normal. 5/5 ms and Nl sensation B. No cerebellar deficits. Nl cranial exam  SKIN Skin is warm, Skin is dry, Skin is normal color.  LYMPHATIC   No adenopathy in neck.  PSYCHIATRIC Oriented X 3, Normal affect.  ED Medication Orders     ED Medication Orders (From admission, onward)    Start Ordered     Status Ordering Provider    11/01/19 1240 11/01/19 1239  ondansetron (ZOFRAN) injection 4 mg  Once     Route: Intravenous  Ordered Dose: 4 mg     Last MAR action: Given Larrie Lucia A    11/01/19 1229 11/01/19 1229  nitroglycerin (NITROSTAT) SL tablet 0.4 mg  Every 5 min PRN      Route: Sublingual  Ordered Dose: 0.4 mg     Last MAR action: Given Shontae Rosiles A    11/01/19 1159 11/01/19 1158  pantoprazole (PROTONIX) injection 40 mg  Once     Route: Intravenous  Ordered Dose: 40 mg     Last MAR action: Given Jewelle Whitner A    11/01/19 1144 11/01/19 1144  aspirin tablet 325 mg  Once     Route: Oral  Ordered Dose: 325 mg     Last MAR action: Given Arcola Freshour A    11/01/19 1144 11/01/19 1144    Once     Route: Topical  Ordered Dose: 0.5 inch     Discontinued Kshawn Canal A    11/01/19 1115 11/01/19 1114    Once     Route: Intramuscular  Ordered Dose: 0.5 mL     Discontinued Atlantis Delong A          Orders Placed During this Encounter     Orders Placed This Encounter   Procedures   . COVID-19 (SARS-COV-2) ( Rapid)   . Chest AP Portable   . FL Swallow Cine/Video Contrast Study W Speech Therapy   . CBC and differential   . Comprehensive metabolic panel   . Troponin I   . GFR   . Troponin I   . Troponin I   . Phosphorus   . Magnesium   . Lipase   . GFR   . Vital Signs   . Weight   . Place sequential compression device   . Weight   . ED [employer]   . ECG 12 lead   . ECG 12 lead  (Electrocardiogram)   . CARDIAC STRIPS   . Saline lock IV   . Adult Admit to Observation   . Discharge patient   . Hospital Follow Up Visit (HRT [place])       Diagnostic Study Results     Labs     Results     Procedure Component Value Units Date/Time    Lipase [[id number]] Collected: 11/02/19 0543    Specimen: Blood Updated: 11/02/19 0741     Lipase 24 U/L     Narrative:      Fasting    GFR [[id number]] Collected: 11/02/19 0543     Updated: 11/02/19 0741     EGFR >60.0    Narrative:      Fasting    Comprehensive metabolic panel [[id number]] Collected: 11/02/19 0543    Specimen: Blood Updated: 11/02/19 0741     Glucose 87 mg/dL      BUN 18 mg/dL      Creatinine 1.0 mg/dL      Sodium 086 mEq/L      Potassium 4.2 mEq/L      Chloride 107 mEq/L      CO2 24 mEq/L      Calcium 9.3 mg/dL      Protein, Total 6.0 g/dL       Albumin 3.5 g/dL  AST (SGOT) 17 U/L      ALT <6 U/L      Alkaline Phosphatase 67 U/L      Bilirubin, Total 0.7 mg/dL      Globulin 2.5 g/dL      Albumin/Globulin Ratio 1.4     Anion Gap 8.0    Narrative:      Fasting    Phosphorus [[id number]] Collected: 11/02/19 0543    Specimen: Blood Updated: 11/02/19 0741     Phosphorus 2.6 mg/dL     Narrative:      Fasting    Magnesium [[id number]] Collected: 11/02/19 0543    Specimen: Blood Updated: 11/02/19 0741     Magnesium 2.0 mg/dL     Narrative:      Fasting    Troponin I [[id number]] Collected: 11/02/19 0543    Specimen: Blood Updated: 11/02/19 0712     Troponin I 0.02 ng/mL     CBC without differential [[id number]]  (Abnormal) Collected: 11/02/19 0543    Specimen: Blood Updated: 11/02/19 0556     WBC 5.15 x10 3/uL      Hgb 11.1 g/dL      Hematocrit 62.9 %      Platelets 175 x10 3/uL      RBC 3.55 x10 6/uL      MCV 89.9 fL      MCH 31.3 pg      MCHC 34.8 g/dL      RDW 14 %      MPV 9.4 fL      Nucleated RBC 0.0 /100 WBC      Absolute NRBC 0.00 x10 3/uL     Narrative:      Fasting    Troponin I [[id number]] Collected: 11/01/19 1910    Specimen: Blood Updated: 11/01/19 1950     Troponin I 0.01 ng/mL     Troponin I [[id number]] Collected: 11/01/19 1608    Specimen: Blood Updated: 11/01/19 1728     Troponin I 0.01 ng/mL           Radiologic Studies  Radiology Results (24 Hour)     Procedure Component Value Units Date/Time    [facility] Swallow Cine/Video Contrast Corky Sox Speech Therapy [[id number]] Collected: 11/02/19 1140    Order Status: Completed Updated: 11/02/19 1143    Narrative:      History: Dysphagia with feeding difficulties.    Findings:  Fluoroscopic guidance was provided to Speech Pathology for  oropharyngeal assessment.  Please see their separate report for detailed  findings and feeding recommendations. Fluoroscopy time 296 seconds.      Impression:        Fluoroscopic guidance was provided for oropharyngeal swallowing study.    Wilmon Pali, MD   11/02/2019 11:41 AM       .    Clinical Course / MDM       Notes:   1225-with worsening chest pain.  Repeat EKG shows no acute ST-T wave changes compared to previous EKG done earlier today.  No ST elevations noted.    After second EKG patient was reevaluated and patient denied active chest pain.  Patient will be admitted for serial troponins and possibly cardiology consult  Discussion of abnormal results/incidental findings:       Consults:      Data Review     Nursing records reviewed and agree: Yes    Pulse Oximetry Analysis - Normal  Laboratory results reviewed by EDP: Yes  Radiologic study results reviewed by EDP: Yes  Rendering Provider: Dr Lourdes Sledge    Monitors, EKG     EKG (interpreted by ED physician): Sinus tachycardia. Less than 150. Normal axis. No ST elevations. Normal intervals.  Nonspecific ST-T wave abnormalities noted.  Cardiac Monitor (interpreted by ED physician): Sinus tach. Less than 150. No ectopy.    Critical Care     Critical care exclusive of time spent performing procedures.    Total time:          Clinical Impression & Disposition     Clinical Impression:  1. Chest pain        Disposition  ED Disposition     ED Disposition Condition Date/Time Comment    Observation  Wed Nov 01, 2019  1:33 PM Admitting Physician: Fredonia Highland A [[id number]]   Service:: Medicine [[id number]]   Estimated Length of Stay: < 2 midnights   Tentative Discharge Plan?: Home or Self Care [1]   Does patient need telemetry?: Yes   Telemetry type (separate Telemetry order is also required):: Cardiac telemetry            Prescriptions    Discharge Medication List as of 11/02/2019 11:01 AM      START taking these medications    Details   pantoprazole (PROTONIX) 40 MG tablet Take 1 tablet (40 mg total) by mouth every morning before breakfast, Starting Thu 11/02/2019, Until Sat 12/02/2019, E-Rx                    Lucille Passy, MD  11/08/19 1356

## 2019-11-01 NOTE — Pharmacy Admission Med History Advanced (Signed)
Admission Medication History - ADVANCED      Patient's Pharmacy Information:    Primary Pharmacy Updated in Epic: No    Preferred pharmacy:   [id number] - [place], [place] - [address]  [address]  [place] [address]  Phone: [phone] Fax: [phone]    [id number] - [place], [place] - [address]  [address] [place] [address]  Phone: [phone] Fax: [phone]      Primary Source of Medication History: Family Member    Current Outpatient Medications   Medication Sig    allopurinol (ZYLOPRIM) 300 MG tablet TAKE 1 TABLET BY MOUTH EVERY DAY    aspirin EC 81 MG EC tablet Take 81 mg by mouth daily       atorvastatin (LIPITOR) 20 MG tablet Take 20 mg by mouth nightly    carbidopa-levodopa (Rytary) 36.25-145 MG Cap CR per capsule Take 3 capsules by mouth every 4 (four) hours At 0800, 1200, 1600, 2000, 0000    losartan (COZAAR) 25 MG tablet TAKE 1 TABLET BY MOUTH EVERY DAY    oxybutynin (DITROPAN-XL) 10 MG 24 hr tablet Take 1 tablet (10 mg total) by mouth daily    sertraline (ZOLOFT) 25 MG tablet TAKE 1 TABLET BY MOUTH EVERY DAY    Ascorbic Acid (VITAMIN C PO) Take 1,000 mg by mouth    Coenzyme Q10 (CoQ10) 200 MG Cap Take 400 mg by mouth    glucosamine-chondroitin 500-400 MG tablet Take 1 tablet by mouth    Multiple Vitamins-Minerals (CENTRUM SILVER 50+MEN PO) Take 1 tablet by mouth     Summary:    The following medication changes were documented in the prior to admission medication list:  The following medications were changed (reason if applicable):  Rytary-per son they are giving him Rytary every 4 hours and if he goes to bed earlier they will skip the last dose.   The following medications were removed (reason if applicable):  Trospium     Noralee Chars, [facility] [employer] [facility]

## 2019-11-01 NOTE — Plan of Care (Signed)
Problem: Safety  Goal: Patient will be free from injury during hospitalization  Outcome: Progressing  Flowsheets (Taken 11/01/2019 2239)  Patient will be free from injury during hospitalization:   Assess patient's risk for falls and implement fall prevention plan of care per policy   Provide and maintain safe environment   Use appropriate transfer methods   Ensure appropriate safety devices are available at the bedside   Include patient/ family/ care giver in decisions related to safety   Hourly rounding   Assess for patients risk for elopement and implement Elopement Risk Plan per policy   Provide alternative method of communication if needed (communication boards, writing)     Problem: Pain  Goal: Pain at adequate level as identified by patient  Outcome: Progressing  Flowsheets (Taken 11/01/2019 2239)  Pain at adequate level as identified by patient:   Identify patient comfort function goal   Assess for risk of opioid induced respiratory depression, including snoring/sleep apnea. Alert healthcare team of risk factors identified.   Assess pain on admission, during daily assessment and/or before any "as needed" intervention(s)   Reassess pain within 30-60 minutes of any procedure/intervention, per Pain Assessment, Intervention, Reassessment (AIR) Cycle   Evaluate if patient comfort function goal is met   Offer non-pharmacological pain management interventions   Evaluate patient's satisfaction with pain management progress     Problem: Hemodynamic Status: Cardiac  Goal: Stable vital signs and fluid balance  Outcome: Progressing  Flowsheets (Taken 11/01/2019 2240)  Stable vital signs and fluid balance:   Monitor/assess vital signs and telemetry per unit protocol   Assess signs and symptoms associated with cardiac rhythm changes   Monitor intake/output per unit protocol and/or LIP order   Monitor lab values   Monitor for leg swelling/edema and report to LIP if abnormal

## 2019-11-01 NOTE — ED Notes (Signed)
Since drinking fluids and failing to swallow properly (SLP ordered and MD aware), patient is getting increased chest pain.

## 2019-11-01 NOTE — ED Notes (Signed)
[facility] EMERGENCY DEPARTMENT  ED NURSING NOTE FOR THE RECEIVING INPATIENT NURSE   ED NURSE Rafael Bihari [phone]   ED CHARGE RN Jae Dire [phone]   ADMISSION INFORMATION   [name] [name] is a 79 y.o. male admitted with an ED diagnosis of:    1. Chest pain         Isolation: None   Allergies: Patient has no known allergies.   Holding Orders confirmed? Yes   Belongings Documented? Yes   Home medications sent to pharmacy confirmed? N/A   NURSING CARE   Patient Comes From:   Mental Status: Home/Family Care  alert and oriented   ADL: Needs assistance with ADLs   Ambulation: 1 person assist   Pertinent Information  and Safety Concerns: Has Parkinson's so does tremor. Nitro tabs did work.   SLP ordered because patient was choking after giving water with aspirin.    CT / NIH   CT Head ordered on this patient?  N/A   NIH/Dysphagia assessment done prior to admission? N/A   VITAL SIGNS (at the time of this note)      Vitals:    11/01/19 1315   BP: 113/57   Pulse: 85   Resp: 18   Temp: 98.4 F (36.9 C)   SpO2: 95%

## 2019-11-02 ENCOUNTER — Observation Stay: Payer: No Typology Code available for payment source

## 2019-11-02 DIAGNOSIS — G20 Parkinson's disease: Secondary | ICD-10-CM

## 2019-11-02 DIAGNOSIS — M1A9XX0 Chronic gout, unspecified, without tophus (tophi): Secondary | ICD-10-CM

## 2019-11-02 DIAGNOSIS — E782 Mixed hyperlipidemia: Secondary | ICD-10-CM

## 2019-11-02 DIAGNOSIS — R1012 Left upper quadrant pain: Secondary | ICD-10-CM

## 2019-11-02 DIAGNOSIS — K219 Gastro-esophageal reflux disease without esophagitis: Secondary | ICD-10-CM

## 2019-11-02 DIAGNOSIS — D649 Anemia, unspecified: Secondary | ICD-10-CM

## 2019-11-02 DIAGNOSIS — R339 Retention of urine, unspecified: Secondary | ICD-10-CM

## 2019-11-02 DIAGNOSIS — F32A Depression, unspecified: Secondary | ICD-10-CM

## 2019-11-02 DIAGNOSIS — R1312 Dysphagia, oropharyngeal phase: Secondary | ICD-10-CM

## 2019-11-02 DIAGNOSIS — R1319 Other dysphagia: Secondary | ICD-10-CM

## 2019-11-02 LAB — COMPREHENSIVE METABOLIC PANEL
ALT: <6 U/L (ref 0–55)
AST (SGOT): 17 U/L (ref 5–34)
Albumin/Globulin Ratio: 1.4 (ref 0.9–2.2)
Albumin: 3.5 g/dL (ref 3.5–5.0)
Alkaline Phosphatase: 67 U/L (ref 38–106)
Anion Gap: 8 (ref 5.0–15.0)
BUN: 18 mg/dL (ref 9–28)
Bilirubin, Total: 0.7 mg/dL (ref 0.2–1.2)
CO2: 24 mEq/L (ref 22–29)
Calcium: 9.3 mg/dL (ref 7.9–10.2)
Chloride: 107 mEq/L (ref 100–111)
Creatinine: 1 mg/dL (ref 0.7–1.3)
Globulin: 2.5 g/dL (ref 2.0–3.6)
Glucose: 87 mg/dL (ref 70–100)
Potassium: 4.2 mEq/L (ref 3.5–5.1)
Protein, Total: 6 g/dL (ref 6.0–8.3)
Sodium: 139 mEq/L (ref 136–145)

## 2019-11-02 LAB — CBC
Absolute NRBC: 0 10*3/uL (ref 0.00–0.00)
Hematocrit: 31.9 % — ABNORMAL LOW (ref 37.6–49.6)
Hgb: 11.1 g/dL — ABNORMAL LOW (ref 12.5–17.1)
MCH: 31.3 pg (ref 25.1–33.5)
MCHC: 34.8 g/dL (ref 31.5–35.8)
MCV: 89.9 fL (ref 78.0–96.0)
MPV: 9.4 fL (ref 8.9–12.5)
Nucleated RBC: 0 /100 WBC (ref 0.0–0.0)
Platelets: 175 10*3/uL (ref 142–346)
RBC: 3.55 10*6/uL — ABNORMAL LOW (ref 4.20–5.90)
RDW: 14 % (ref 11–15)
WBC: 5.15 10*3/uL (ref 3.10–9.50)

## 2019-11-02 LAB — ECG 12-LEAD
Atrial Rate: 79 {beats}/min
Atrial Rate: 66 {beats}/min
P Axis: 53 degrees
P Axis: 54 degrees
P-R Interval: 174 ms
P-R Interval: 170 ms
Q-T Interval: 390 ms
Q-T Interval: 372 ms
QRS Duration: 84 ms
QRS Duration: 86 ms
QTC Calculation (Bezet): 447 ms
QTC Calculation (Bezet): 377 ms
R Axis: 47 degrees
R Axis: 50 degrees
T Axis: 171 degrees
T Axis: 166 degrees
Ventricular Rate: 79 {beats}/min
Ventricular Rate: 62 {beats}/min

## 2019-11-02 LAB — GFR: EGFR: >60

## 2019-11-02 LAB — MAGNESIUM: Magnesium: 2 mg/dL (ref 1.6–2.6)

## 2019-11-02 LAB — LIPASE: Lipase: 24 U/L (ref 8–78)

## 2019-11-02 LAB — HEMOGLOBIN A1C
Average Estimated Glucose: 85.3 mg/dL
Hemoglobin A1C: 4.6 % (ref 4.6–5.9)

## 2019-11-02 LAB — TROPONIN I: Troponin I: 0.02 ng/mL (ref 0.00–0.05)

## 2019-11-02 LAB — PHOSPHORUS: Phosphorus: 2.6 mg/dL (ref 2.3–4.7)

## 2019-11-02 MED ORDER — BARIUM SULFATE 700 MG PO TABS
700.0000 mg | ORAL_TABLET | Freq: Once | ORAL | Status: AC | PRN
Start: 2019-11-02 — End: 2019-11-02
  Administered 2019-11-02: 10:00:00 700 mg via ORAL

## 2019-11-02 MED ORDER — ATORVASTATIN CALCIUM 20 MG PO TABS
40.0000 mg | ORAL_TABLET | Freq: Every evening | ORAL | 0 refills | Status: AC
Start: 2019-11-02 — End: 2019-12-02

## 2019-11-02 MED ORDER — PANTOPRAZOLE SODIUM 40 MG PO TBEC
40.0000 mg | DELAYED_RELEASE_TABLET | Freq: Every morning | ORAL | 0 refills | Status: AC
Start: 2019-11-02 — End: 2019-12-02

## 2019-11-02 MED ORDER — BARIUM SULFATE 40 % PO SUSP
60.0000 mL | Freq: Once | ORAL | Status: AC | PRN
Start: 2019-11-02 — End: 2019-11-02
  Administered 2019-11-02: 10:00:00 60 mL via ORAL

## 2019-11-02 MED ORDER — PANTOPRAZOLE SODIUM 40 MG PO TBEC
40.0000 mg | DELAYED_RELEASE_TABLET | Freq: Every morning | ORAL | Status: DC
Start: 2019-11-02 — End: 2019-11-02
  Administered 2019-11-02: 11:00:00 40 mg via ORAL
  Filled 2019-11-02: qty 1

## 2019-11-02 MED ORDER — BARIUM SULFATE 40 % PO PSTE
120.0000 mL | PASTE | Freq: Once | ORAL | Status: AC | PRN
Start: 2019-11-02 — End: 2019-11-02
  Administered 2019-11-02: 10:00:00 120 mL via ORAL

## 2019-11-02 MED ORDER — BARIUM SULFATE 40 % PO SUSR
150.0000 mL | Freq: Once | ORAL | Status: AC | PRN
Start: 2019-11-02 — End: 2019-11-02
  Administered 2019-11-02: 10:00:00 150 mL via ORAL

## 2019-11-02 NOTE — Hospital Course (Signed)
Mr. [name] [name] is a 79 y/o M with hx of 4-vessel CABG, parkinson's, and dysphagia presented to the ER on 10/6 with substernal chest pain, diaphoresis, and dyspnea that was different from his normal GERD/dysphagia pain. His pain was relieved with nitroglycerin tablet in the ED and he has negative troponins x3 along with negative EKG x2 and stable vital signs during observation. He has not required oxygen supplementation or medication for pain.    Cardiology has been consulted and is comfortable with scheduling the lexiscan test outpatient. They are also in agreement with increasing atorvastatin dosage from 20 to 40 for further LDL lowering and cardioprotection. Mr. [name] [name] will follow up with cards outpatient.    He has received his medications for his chronic conditions while in the hospital, including Rytary for Parkinsons, Oxybutinin for urinary retention.    We initially scheduled him for a video swallow study for his chronic dysphagia and recommendation from SLP but will be deferring to his previously scheduled barium schedule outpatient. We are adding 40mg  protonix QD due to his chronic reflux.    He has had no chest pain since initial relief with nitro tablets and has had an overall uncomplicated hospital stay. He is being discharged and should follow up with his primary care physician as well as cardiologist.

## 2019-11-02 NOTE — Final Progress Note (DC Note for stay less than 48 (Signed)
[facility] Final Progress Note  Physician Available 24 hours a day:  Please call number in sticky note to reach hospital team.  Office#: [phone]     Date Time: 11/02/19 3:43 PM  Patient Name: [name] [name],[name] [name]  Attending Physician: No att. providers found  Primary Care Physician: [name], MD    CC: Chest pain    Assessment:   Principal Problem:    Chest pain  Active Problems:    Parkinson's disease    CAD (coronary artery disease)    Other hyperlipidemia    S/P CABG (coronary artery bypass graft)    Normocytic anemia    Esophageal dysphagia  Resolved Problems:    * No resolved hospital problems. *    79 y.o. male with hx of CAD with four-vessel CABG in 90s, femoral bypass, and Parkinson's admitted for chest pain, ACS/unstable angina vs Gerd/esophageal dysfunction.     Plan:   # Chest Pain   History of 4-vessel CABG, femoral bypass, and Parkinson's; Heart Score 6.   Trops negative x 4  EKGs benign to this point.   Resolved in the ED after nitro  - EKG as needed for chest pain recurrence  - Cardiology consult - [facility], appreciate recs  - Lipitor increased to 40mg  daily upon discharge for better LDL control  - Plan for outpatient cardiology follow up and Lexiscan in 7-9 days   - Lipid panel, A1c, and TSH ordered, results still pending at discharge    # GERD  - Protonix 40 mg daily     #Dysphagia and Odynophagia  - Video swallow study: safe to remain on regular texture, thin liquid diet. Noted esophageal retention and slowed emptying with possible concern for hiatal hernia   - Regular diet with thin liquids    #Normocytic Anemia  - Iron panel and ferritin ordered, results still pending at discharge    #LUQ abdominal pain  Abd exam remained stable, lipase normal    Chronic:   Parkinson's: Continue Rytary   Urinary retention: continue oxybutynin 10 mg    Gout: Allopurinol 300 mg    Depression: Zoloft 25 mg daily     #FEN/GI   - Fluid: PO  - Diet: Regular diet with thin liquids  -  Electrolytes: K >4, Mg >2    #PPX  - DVT: Lovenox   - GI: Protonix     CODE STATUS: Full     DISPO:  Today's date: 11/02/2019  Admit Date: 11/01/2019 11:04 AM  Anticipated medical stability for discharge:today  Service status: Observation:patient is stable and improving.  Anticipated discharge needs: tbd    Hospital Course:     [name] [name] is a 79 y/o M with hx of 4-vessel CABG, parkinson's, and dysphagia presented to the ER on 10/6 with substernal chest pain, diaphoresis, and dyspnea that was different from his normal GERD/dysphagia pain. His pain was relieved with nitroglycerin tablet in the ED and he has negative troponins x3 along with negative EKG x2 and stable vital signs during observation. He has not required oxygen supplementation or medication for pain.    Cardiology ([facility]) has been consulted and recommends outpatient lexiscan test and follow up in 7-9 days. Discharged on increased Lipitor 40 mg.     Home medications continued without any adjustment.     Per chronic dysphagia, Video swallow study performed and he is safe to remain on regular texture, thin liquid diet. Noted esophageal retention and slowed emptying with possible concern for  hiatal hernia. Will need GI follow up. Added 40mg  protonix QD.    He has had no chest pain since initial relief with nitro tablets and has had an overall uncomplicated hospital stay. He is being discharged and should follow up with his primary care physician as well as cardiologist.    Review of Systems:     Denies fever, sweats, chills, chest pain, dyspnea, N/V/D     Physical Exam:     VITAL SIGNS: Physical Exam:   Temp:  [97.3 F (36.3 C)-98.4 F (36.9 C)] 97.4 F (36.3 C)  Heart Rate:  [59-67] 62  Resp Rate:  [16-17] 16  BP: (111-130)/(62-69) 130/65      Intake/Output Summary (Last 24 hours) at 11/02/2019 1543  Last data filed at 11/02/2019 0800  Gross per 24 hour   Intake --   Output 350 ml   Net -350 ml    Gen: well-appearing, NAD  HEENT: EOMI, PERRL, MMM    CV: RRR, no murmurs, rubs, or gallops  Lungs: breathing comfortably on RA, CTAB, no wheezes, rales, or rhonchi  Abdomen: Soft, non-tender on palpation (LUQ pain had resolved)   Ext: warm, distal pulses intact, no LE edema  Neuro: awake and alert, no focal deficits, coarse resting tremor BL UE  Skin: no rashes or lesions noted       Discharge Meds:     Current Outpatient Medications   Medication Sig   . allopurinol (ZYLOPRIM) 300 MG tablet TAKE 1 TABLET BY MOUTH EVERY DAY   . aspirin EC 81 MG EC tablet Take 81 mg by mouth daily      . carbidopa-levodopa (Rytary) 36.25-145 MG Cap CR per capsule Take 3 capsules by mouth every 4 (four) hours At 0800, 1200, 1600, 2000, 0000   . losartan (COZAAR) 25 MG tablet TAKE 1 TABLET BY MOUTH EVERY DAY   . oxybutynin (DITROPAN-XL) 10 MG 24 hr tablet Take 1 tablet (10 mg total) by mouth daily   . sertraline (ZOLOFT) 25 MG tablet TAKE 1 TABLET BY MOUTH EVERY DAY   . Ascorbic Acid (VITAMIN C PO) Take 1,000 mg by mouth   . atorvastatin (LIPITOR) 20 MG tablet Take 2 tablets (40 mg total) by mouth nightly   . Coenzyme Q10 (CoQ10) 200 MG Cap Take 400 mg by mouth   . glucosamine-chondroitin 500-400 MG tablet Take 1 tablet by mouth   . pantoprazole (PROTONIX) 40 MG tablet Take 1 tablet (40 mg total) by mouth every morning before breakfast       Labs:     Labs (last 24 hours):  Results     Procedure Component Value Units Date/Time    Lipase [[id number]] Collected: 11/02/19 0543    Specimen: Blood Updated: 11/02/19 0741     Lipase 24 U/L     Narrative:      Fasting    GFR [[id number]] Collected: 11/02/19 0543     Updated: 11/02/19 0741     EGFR >60.0    Narrative:      Fasting    Comprehensive metabolic panel [[id number]] Collected: 11/02/19 0543    Specimen: Blood Updated: 11/02/19 0741     Glucose 87 mg/dL      BUN 18 mg/dL      Creatinine 1.0 mg/dL      Sodium 295 mEq/L      Potassium 4.2 mEq/L      Chloride 107 mEq/L      CO2 24 mEq/L      Calcium 9.3  mg/dL      Protein, Total 6.0 g/dL       Albumin 3.5 g/dL      AST (SGOT) 17 U/L      ALT <6 U/L      Alkaline Phosphatase 67 U/L      Bilirubin, Total 0.7 mg/dL      Globulin 2.5 g/dL      Albumin/Globulin Ratio 1.4     Anion Gap 8.0    Narrative:      Fasting    Phosphorus [[id number]] Collected: 11/02/19 0543    Specimen: Blood Updated: 11/02/19 0741     Phosphorus 2.6 mg/dL     Narrative:      Fasting    Magnesium [[id number]] Collected: 11/02/19 0543    Specimen: Blood Updated: 11/02/19 0741     Magnesium 2.0 mg/dL     Narrative:      Fasting    Troponin I [[id number]] Collected: 11/02/19 0543    Specimen: Blood Updated: 11/02/19 0712     Troponin I 0.02 ng/mL     CBC without differential [[id number]]  (Abnormal) Collected: 11/02/19 0543    Specimen: Blood Updated: 11/02/19 0556     WBC 5.15 x10 3/uL      Hgb 11.1 g/dL      Hematocrit 62.9 %      Platelets 175 x10 3/uL      RBC 3.55 x10 6/uL      MCV 89.9 fL      MCH 31.3 pg      MCHC 34.8 g/dL      RDW 14 %      MPV 9.4 fL      Nucleated RBC 0.0 /100 WBC      Absolute NRBC 0.00 x10 3/uL     Narrative:      Fasting    TSH [[id number]] Collected: 11/02/19 0543    Specimen: Blood Updated: 11/02/19 0544    Narrative:      Fasting    Lipid panel [[id number]] Collected: 11/02/19 0543    Specimen: Blood Updated: 11/02/19 0544    Narrative:      Fasting  Fasting specimen    Hemoglobin A1C [[id number]] Collected: 11/02/19 0543    Specimen: Blood Updated: 11/02/19 0544    Narrative:      Fasting    Troponin I [[id number]] Collected: 11/01/19 1910    Specimen: Blood Updated: 11/01/19 1950     Troponin I 0.01 ng/mL     Troponin I [[id number]] Collected: 11/01/19 1608    Specimen: Blood Updated: 11/01/19 1728     Troponin I 0.01 ng/mL           Imaging (last 24 hours), reviewed and are significant for:  FL Swallow Cine/Video Contrast Study W Speech Therapy    Result Date: 11/02/2019  Fluoroscopic guidance was provided for oropharyngeal swallowing study. Wilmon Pali, MD  11/02/2019 11:41 AM      Signed by: Alphonzo Dublin, MD    Office#: [phone]     [id number], Pryor Ochoa, MD

## 2019-11-02 NOTE — Progress Notes (Signed)
Discharge orders received and acknowledged. Discharge instructions and education given to pt and son at bedside. Pt is forgetful at times at baseline, but lives w/ son; son verbalizes understanding of all instructions and education. IV removed, tele box removed and returned by tech. Pt transported to lobby w/ all belongings. Pt transported home by son.

## 2019-11-02 NOTE — UM Notes (Signed)
11/02/2019    MEDICARE [employer] MED ADV [id number]    11/01/19 1333  Adult Admit to Observation Once         EMERGENCY DEPARTMENT HISTORY AND PHYSICAL EXAM    Date: 11/01/2019  Patient Name: [name] [name],[name] [name]  Attending Physician: Lucille Passy, MD  Patient DOB:  11-13-1940  MRN:  [id number]  Room:  24/B24    Patient was evaluated by ED physician, Dr Lourdes Sledge, at 11:40 AM    History         Chief Complaint   Patient presents with   . Chest Pain          The patient [name] [name], is a 79 y.o. 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  He notes this midsternal heaviness without radiation with nausea and diaphoresis according to his son.    He notes that yesterday he had a 15-minute episode of chest discomfort that resolved on its own.      Date Time: 11/01/19 6:26 PM  Patient Name: [name] [name],[name] [name]  Attending Physician: Willia Craze, MD  Primary Care Physician: [name], MD    CC: Chest pain       Assessment:         Active Hospital Problems    Diagnosis   . Chest pain     79 y.o. 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     Plan:   # Chest Pain   # Unstable Angina   History of 4-vessel CABG, femoral bypass, and Parkinson's; Heart Score 6. Given holosystolic   - EKG as needed   - follow-up troponin x3 , every 3 hours   - Cardiology consult -  [facility]   - Lipitor 20 mg    # GERD  - Protonix 40 mg daily     #Dysphagia and Odynophagia  - SLP suggested video swallow study tomorrow  - NPO except for meds with sips     #LUQ abdominal pain  - Serial abdominal exams   - f/u Lipase in AM     Chronic:   Parkinson's: Continue Rytary   Urinary retention: continue oxybutynin 10  mg    Gout: Allopurinol 300 mg    Depression: Zoloft 25 mg daily     #FEN/GI   - Fluid: 50 cc/hr to end oat 0800 tomorrow   - Diet: NPO, video swallow study   - Electrolytes: K >4, Mg >2    #PPX  - DVT: Lovenox   - GI: Protonix     CODE STATUS: Full     DISPO:  Based on the information available on the day of this admission, patient will be admitted to service status:  Observation:patient is stable and improving. due to unstable angina   Anticipated medical stability for discharge:Yellow - maybe tomorrow  Anticipated discharge needs: cardiac monitoring   A/P: 79 y.o. male with known CAD s/p CABG x4 (1990s), hx femoral bypass (1990s), Parkinson's DM2, depression, HTN, HLD, pulmonary nodule, presenting with chest pain concerning for ACS likely unstable angina, vs dysphagia/esophageal spasm vs gerd.  -Admit to telemetry  -Repeat trop  -[facility] consulted by ED  -  Nitro prn  -SLP eval  -Statin  -Protonix    chest X-ray  Assessment:    Chest pain, atypical.  I cannot rule out unstable angina at this time but his initial troponin was normal and his EKG looked benign.  We certainly should keep him for serial troponins tonight and telemetry, but if he is asymptomatic tomorrow we may be able to discharge him on GI medication for an outpatient Lexiscan MPI, which he prefers to staying longer for [facility] tomorrow, unless the clinical picture suggests we should do more as an inpatient.  If his troponins turn positive we could discuss cardiac catheterization versus conservative management.  He has a long history of GI related chest pain which is relieved by vomiting.   Status post CABG in 2006 with LIMA to LAD, saphenous vein graft to OM1, saphenous vein graft to first diagonal, and saphenous vein graft to distal LAD.   History of GERD   Dyslipidemia with ideal LDL lowering target of 55 or less-most recently 97.   Parkinson's syndrome   Urinary retention history   Gout    Recommendations:    Serial troponins  tonight   Continue his home medications for his heart   Add Zetia 10 mg daily for better LDL lowering with a target LDL of about 55 or better for secondary prevention in this patient.  His last LDL was 97.  I will leave adding the Zetia to the primary care team if they agree.  T 98.5, HR 78, RR 20, BP 156/81, H/H 13.0/36.9, Glucose 131,     In ED     Date/Time Order Dose Route Action Action by Comments    11/01/2019 1238 influenza adjuvanted quadrivalent vaccine (PF) (FLUAD) IM injection (age 61 & greater) 0.5 mL 0 mL Intramuscular Hold Stana Bunting, RN Admitting    11/01/2019 1152 aspirin tablet 325 mg 325 mg Oral Given Stana Bunting, RN     11/01/2019 1237 nitroglycerin (NITRO-BID) 2 % ointment 0.5 inch 0.5 inch Topical Ointment Removed Stana Bunting, RN Time automatically adjusted from order being discontinued    11/01/2019 1154 nitroglycerin (NITRO-BID) 2 % ointment 0.5 inch 0.5 inch Topical Ointment Applied Stana Bunting, RN     11/01/2019 1213 pantoprazole (PROTONIX) injection 40 mg 40 mg Intravenous Given Stana Bunting, RN     11/01/2019 1246 nitroglycerin (NITROSTAT) SL tablet 0.4 mg 0.4 mg Sublingual Given Stana Bunting, RN     11/01/2019 1241 nitroglycerin (NITROSTAT) SL tablet 0.4 mg 0.4 mg Sublingual Given Stana Bunting, RN     11/01/2019 1236 nitroglycerin (NITROSTAT) SL tablet 0.4 mg 0.4 mg Sublingual Given Stana Bunting, RN     11/01/2019 1242 ondansetron (ZOFRAN) injection 4 mg 4 mg Intravenous Given Stana Bunting, RN                11/02/2019    Subjective:   Denies chest pain, SOB or palpitations. States he feels well.     Assessment:      Chest pain, atypical. Ruled out with negative troponins.     CAD - Status post CABG in 2006 with LIMA to LAD, saphenous vein graft to OM1, saphenous vein graft to first diagonal, and saphenous vein graft to distal LAD.   Nuclear stress test 07/2015 - No reversible  ischemia or infarction. EF 66%   History of GERD - He has a long history of GI related chest pain which is relieved by vomiting.   History of dysphagia for about a year.   Dyslipidemia  with ideal LDL lowering target of 55 or less-most recently 97.   Parkinson's syndrome   Urinary retention history   Gout    Recommendation:    Ruled out with negative troponins.    Continue ASA.    Needs better lipid control. Discussed with medicine resident - will increase statin instead of adding Zetia and repeat lipids in 8-12 weeks as an outpatient.    Continue losartan.   Ok for discharge from a cardiac standpoint. Will order outpatient follow up.      Scheduled    allopurinol (ZYLOPRIM) tablet 300 mg   300 mg, PO, Daily   Last Action:  Given, 300 mg at 10/07 0804   aspirin EC tablet 81 mg   81 mg, PO, Daily   Last Action:  Given, 81 mg at 10/07 0806   atorvastatin (LIPITOR) tablet 20 mg   20 mg, PO, QHS   Last Action:  Given, 20 mg at 10/06 2215   carbidopa-levodopa (RYTARY) 36.25-145 MG CR capsule 3 capsule   3 capsule, PO, 5X Daily   Last Action:  Given, 3 capsule at 10/07 0807   enoxaparin (LOVENOX) syringe 40 mg   40 mg, SC, Daily   Last Action:  Given, 40 mg at 10/07 0807   influenza adjuvanted quadrivalent vaccine (PF) (FLUAD) IM injection (age 71 & greater) 0.5 mL   0.5 mL, IM, Once   Last Action:  Ordered   losartan (COZAAR) tablet 25 mg   25 mg, PO, Daily   Last Action:  Given, 25 mg at 10/07 0805   oxybutynin XL (DITROPAN-XL) 24 hr tablet 10 mg   10 mg, PO, Daily   Last Action:  Given, 10 mg at 10/07 0804   pantoprazole (PROTONIX) EC tablet 40 mg   40 mg, PO, QAM AC   Last Action:  Ordered   sertraline (ZOLOFT) tablet 25 mg   25 mg, PO, Daily   Last Action:  Given, 25 mg at 10/07 0804   Continuous    0.9% NaCl infusion   50 mL/hr, IV, Continuous   Last Action:  New Bag, 50 mL/hr at 10/06 1944   PRN    acetaminophen (TYLENOL) suppository 650 mg (Or Linked Group #1)   650 mg, RE, Q6H PRN   Last Action:   Ordered   acetaminophen (TYLENOL) tablet 650 mg (Or Linked Group #1)   650 mg, PO, Q6H PRN   Last Action:  Ordered   dextrose (GLUCOSE) 40 % oral gel 15 g of glucose (And Linked Group #2)   15 g of glucose, PO, PRN   Last Action:  Ordered   dextrose (GLUCOSE) 40 % oral gel 15 g of glucose (And Linked Group #3)   15 g of glucose, PO, PRN   Last Action:  Ordered   dextrose 50 % bolus 12.5 g (And Linked Group #2)   12.5 g, IV, PRN   Last Action:  Ordered   dextrose 50 % bolus 12.5 g (And Linked Group #3)   12.5 g, IV, PRN   Last Action:  Ordered   glucagon (rDNA) (GLUCAGEN) injection 1 mg (And Linked Group #2)   1 mg, IM, PRN   Last Action:  Ordered   melatonin tablet 3 mg   3 mg, PO, QHS PRN   Last Action:  Ordered   naloxone (NARCAN) injection 0.2 mg   0.2 mg, IV, PRN   Last Action:  Ordered   nitroglycerin (NITROSTAT) SL tablet 0.4 mg  0.4 mg, SL, Q5 Min PRN   Last Action:  Ordered   ondansetron (ZOFRAN) injection 4 mg (Or Linked Group #4)   4 mg, IV, Q6H PRN   Last Action:  Ordered   ondansetron (ZOFRAN-ODT) disintegrating tablet 4 mg (Or Linked Group #4)   4 mg, PO, Q6H PRN   Last Action:  Ordered

## 2019-11-02 NOTE — Hospital Course (Incomplete Revision)
Mr. [name] [name] is a 79 y/o M with hx of 4-vessel CABG, parkinson's, and dysphagia presented to the ER on 10/6 with substernal chest pain, diaphoresis, and dyspnea that was different from his normal GERD/dysphagia pain. His pain was relieved with nitroglycerin tablet in the ED and he has negative troponins x3 along with negative EKG x2 and stable vital signs during observation. He has not required oxygen supplementation or medication for pain.    Cardiology has been consulted and is comfortable with scheduling the lexiscan test outpatient. They are also in agreement with increasing atorvastatin dosage from 20 to 40 for further LDL lowering and cardioprotection. Mr. [name] [name] will follow up with cards outpatient.    He has received his medications for his chronic conditions while in the hospital, including Rytary for Parkinsons, Oxybutinin for urinary retention.    He received a video swallow study for his chronic dysphagia and recommendation from SLP and those results are pending. We are adding 40mg  protonix QD due to his chronic reflux.    He has had no chest pain since initial relief with nitro tablets and has had an overall uncomplicated hospital stay. He is being discharged and should follow up with his primary care physician as well as cardiologist.

## 2019-11-02 NOTE — Hospital Course (Signed)
Mr. [name] [name] is a 79 y/o M with hx of 4-vessel CABG, parkinson's, and dysphagia presented to the ER on 10/6 with substernal chest pain, diaphoresis, and dyspnea that was different from his normal GERD/dysphagia pain. His pain was relieved with nitroglycerin tablet in the ED and he has negative troponins x3 along with negative EKG x2 and stable vital signs during observation. He has not required oxygen supplementation or medication for pain.    Cardiology has been consulted and is comfortable with scheduling the lexiscan test outpatient. They are also in agreement with increasing atorvastatin dosage from 20 to 40 for further LDL lowering and cardioprotection. Mr. [name] [name] will follow up with cards outpatient.    He has received his medications for his chronic conditions while in the hospital, including Rytary for Parkinsons, Oxybutinin for urinary retention.    We initially scheduled him for a video swallow study for his chronic dysphagia and recommendation from SLP but will be deferring to his previously scheduled barium schedule outpatient. We are adding 40mg  protonix QD due to his chronic reflux.    He has had no chest pain since initial relief with nitro tablets and has had an overall uncomplicated hospital stay. He is being discharged and should follow up with his primary care physician as well as cardiologist.     Junious Dresser

## 2019-11-02 NOTE — Plan of Care (Addendum)
Pt received resting comfortably in bed, AAOx3-4 w/ forgetfulness, VSS, NSR on tele. No c/o pain, chest pain, or dyspnea. Pt OOB w/ standby assist to bathroom. Son at bedside visiting, updated throughout shift.    Problem: Safety  Goal: Patient will be free from injury during hospitalization  Outcome: Progressing  Flowsheets (Taken 11/02/2019 1115)  Patient will be free from injury during hospitalization:  . Ensure appropriate safety devices are available at the bedside  . Provide and maintain safe environment  . Assess patient's risk for falls and implement fall prevention plan of care per policy  . Use appropriate transfer methods  . Include patient/ family/ care giver in decisions related to safety  . Hourly rounding     Problem: Discharge Barriers  Goal: Patient will be discharged home or other facility with appropriate resources  Outcome: Progressing  Flowsheets (Taken 11/02/2019 1115)  Discharge to home or other facility with appropriate resources:  . Provide appropriate patient education  . Initiate discharge planning  . Provide information on available health resources     Problem: Psychosocial and Spiritual Needs  Goal: Demonstrates ability to cope with hospitalization/illness  Outcome: Progressing  Flowsheets (Taken 11/02/2019 1115)  Demonstrates ability to cope with hospitalizations/illness:  . Provide quiet environment  . Assist patient to identify own strengths and abilities  . Encourage participation in diversional activity  . Include patient/ patient care companion in decisions     Problem: Hemodynamic Status: Cardiac  Goal: Stable vital signs and fluid balance  Outcome: Progressing  Flowsheets (Taken 11/02/2019 1115)  Stable vital signs and fluid balance:  . Monitor/assess vital signs and telemetry per unit protocol  . Weigh on admission and record weight daily  . Assess signs and symptoms associated with cardiac rhythm changes  . Monitor intake/output per unit protocol and/or LIP order  . Monitor for leg  swelling/edema and report to LIP if abnormal  . Monitor lab values     Problem: Impaired Mobility  Goal: Mobility/Activity is maintained at optimal level for patient  Outcome: Progressing  Flowsheets (Taken 11/02/2019 1115)  Mobility/activity is maintained at optimal level for patient:  . Increase mobility as tolerated/progressive mobility  . Encourage independent activity per ability  . Maintain proper body alignment  . Perform active/passive ROM  . Plan activities to conserve energy, plan rest periods

## 2019-11-02 NOTE — SLP Eval Note (Signed)
[facility]   [address]  [place], [place] [address]    Videofluoroscopic Swallowing Study (VFSS)/ Modified Barium Swallow Study (MBSS) Report    Attention Physicians: For patients in an observation or outpatient status, Medicare requires the ordering provider to certify that this service is medically necessary. Please co-sign this evaluation to indicate your agreement. Thank you.    Patient: [name] [name]    MRN#: [id number]   Date: 11/02/2019    HICN#: Medicare Sub. Num: [id number]    Assessment:      Patient with mild oropharyngeal dysphagia characterized by reduced lingual control with intermittent separation/loss of liquid bolus within oral cavity or to the level of the vallecula, consistent delay in initiation of swallow across textures and consistencies, and occasional decrease in base of tongue retraction and pharyngeal constriction. Only trace volume of residuals seen on lingual surface and inconsistently in vallecular/pyriform space. Adequate hyolaryngeal mechanics demonstrated throughout the study. Trace aspiration visualized on initial thin liquid trial likely an isolated incident as no further aspiration noted. Inconsistent flash penetration with thin liquid trials ultimately clearing at the height of the swallow.    Of significance to note based on discussion and collaboration with radiologist, esophageal retention/stasis with retrograde flow below the upper esophageal sphincter seen across all trials. Contrast material was slow to empty but near full esophageal clearance achieved following several minutes of rest after completion of PO trials.     Plan/Recommendation:      Diet Recommendation: Regular texture, thin liquid    Medications: Whole with liquid or in puree     Precautions/Compensations: Upright position during PO intake and up to 30 min after, reduced bolus rate/volume, alternating liquids/solids, smaller meals more frequently throughout the day or multiple rest breaks during  normal-portioned meals    Discharge Recommendation: GI consult/follow-up.    Education:      Educated patient and family to role of speech therapy, results of VFSS, recommended diet, and swallow precautions/strategies.    Patient/family demonstrated good understanding. Discussed additional recommendations for GI follow-up, patient and family in agreement with plan. Reviewed accessing written documentation from hospital stay on MyChart Patient Portal in the event of needing to provide VFSS report on GI f/u.    Goals:      Patient will demonstrate adequate oropharyngeal phase skills for a: regular, thin liquids, without overt s/s of aspiration 10/10 boluses.     Radiologist: Dr. Beckie Busing      History/Current Status/Diagnosis: Chest pain [R07.9]  Secondary Diagnosis: K21.9 acid reflux, R05 cough     Past Medical/Surgical History:  Past Medical History:   Diagnosis Date   . Parkinsons       Past Surgical History:   Procedure Laterality Date   . CHOLECYSTECTOMY  1990s   . CORONARY ARTERY BYPASS GRAFT  1990s   . FEMORAL BYPASS  1994         VFSS Results - Clinical Observations  Consistencies Assessed:     Thin Liquid  Presentation: Cup, Spoon  Oral Stage: Reduced lingual control with scatter of contrast material to buccal cavities, premature loss of bolus to vallecula        Oral Stage Residuals: Lingual, trace/min volume  Pharyngeal Stage: Delayed initiation of swallow, diminished pharyngeal constriction  Esophageal Stage: Retrograde movement of bolus, retention/stasis    Nectar Thick Liquid  Presentation: Cup  Oral Stage: Reduced lingual control with loss of bolus to floor of mouth       Oral Stage Residuals:  Lingual, trace volume  Pharyngeal Stage: Delayed initiation of swallow, decreased base of tongue retraction with wide column of contrast material, diminished pharyngeal constriction       Valleculae Residuals: Trace volume       Pyriform Sinuses Residuals: Trace volume  Esophageal Stage: Retrograde movement of bolus,  retention/stasis    Puree  Oral Stage: Adequate       Oral Stage Residuals: Lingual, min volume  Pharyngeal Stage: Delayed initiation of swallow       Pyriform Sinuses Residuals: Trace volume (R side)  Esophageal Stage: Retention/stasis    Solid  Oral Stage: Adequate  Pharyngeal Stage: Adequate  Esophageal Stage: Retrograde movement of bolus, retention/stasis    13mm Barium Tablet  Oral Stage: Reduced lingual control with separation of tablet from thin liquid  Pharyngeal Stage: Tablet resting in vallecula after separation from thin liquid clearing with multiple liquid wash, otherwise adequate.                  Signature:  Ellene Route, M.S. CCC-SLP     Physician's Signature:   Date:

## 2019-11-02 NOTE — Discharge Instructions (Signed)
Modified Barium Swallow  A modified barium swallow (also called a video fluoroscopic swallowing exam) is a test that checks your ability to swallow different consistencies of fluids and solid materials.It also helps in planning treatment, if needed. During the test, a substance called barium is mixed with a variety of materials that you will swallow.The material mixed withbarium lets the healthcare providers seethe parts involved with swallowing (the tongue, mouth, throat, and esophagus)clearly on X-rays. The test is needed if you have problems swallowing and are at risk of choking or food or liquid going into the lungs (aspiration). It's also needed if you have a feeding tube and your healthcare provider wants to checkwhether you can go back to eating by mouth.   What happens before the test?   Let your healthcare provider know of any medicines you're taking. This includes vitamins, herbs, and over-the-counter medicines. You may be told to stop certain medicines in the days before the test.   Stop eating and drinking 4hours before the test.   Follow any other instructions given by your healthcare provider.    Let the technologist know  For your safety, let the technologist know if you:   Are taking any medicines   Had recent X-rays or tests involving barium   Had recent surgery   Have other health problems, especially those affecting the lungs.   Have any allergies   Are pregnant or may be pregnant  What happens during the test?  The test takes place in an X-ray department. It's done by a speech therapist or feeding specialist. These people are trained to help you with swallowing or feeding problems. A radiology technologist is also present. A radiologistwill be present as well. A radiologistis a doctor who specializes in diagnosis of disease and injury by interpreting medical imaging such as X-rays.    You'll likely be seated in a special chair that is next to an X-ray table. The X-ray table  is set upright.   You're given tiny amounts of foods and liquids of different textures to swallow. This may include thin liquids (such as water) or thick liquids (such as milk). You may also be given soft foods (such as pudding). The foods and liquids contain a small amount of barium.   The speech pathologist or feeding specialist watches your swallowing. You're observed carefully for signs of problems as each food or liquid is swallowed. If problems do occur as you're swallowing, steps are quickly taken to treat these problems.   An X-ray video is also taken as each food or liquid is swallowed. The barium in the foods and liquids shows up clearly on thevideo.  What happens after the test?   You may become constipated after the test. This is due to the barium. If you can drink liquids, drinking water may help to prevent this constipation.   Your stool will appear chalky white or light for 1 to 2 days. This is a sign of the barium passing from your system.    When to call your healthcare provider  Call your healthcare provider if:   You have severe constipation.   You don't have signs of the barium passing (white or light stools) within 24hours.  Follow-up care  The X-ray video and notes from the test are studied by the healthcare providers that were present for your test. These results are then discussed with your main healthcare provider. Your provider will meet with you to go over the results. You'll be advised about   what foods or liquids are safe for you. In severe cases of swallowing disorder, it won't be possible for you to eat or drink safely. This means you'll need a feeding tube if you don't already have one. Your healthcare provider can tell you more about this, if needed.   What are the risks and complications?  Risks and possible complications of a modified barium swallow include:   Liquid going into the lungs (aspiration)   Radiation exposure from X-rays   Blockageof the bowel due to  retained barium   Allergic reaction to the barium  [employer] last reviewed this educational content on 04/27/2018   2000-2021 [employer]. All rights reserved. This information is not intended as a substitute for professional medical care. Always follow your healthcare professional's instructions.

## 2019-11-02 NOTE — Progress Notes (Addendum)
[facility]  PROGRESS NOTE  [name] [name]     Date Time: 11/02/19 9:12 AM  Patient Name: [name] [name]  Medical Record #:  [id number]  Account#:  [id number]  Admission Date:  11/01/2019       Patient Active Problem List   Diagnosis   . Parkinson's disease   . Diabetes mellitus type 2, controlled   . Mild episode of recurrent major depressive disorder   . Neutrophilic leukocytosis   . Low back pain   . Hypertension   . Hepatic cyst   . Dyslipidemia   . Gouty arthropathy   . Diffuse esophageal spasm   . Depression   . CAD (coronary artery disease)   . Anosmia   . Benign essential hypertension   . Angina pectoris   . Nocturia   . Optic neuritis   . Other and unspecified hyperlipidemia   . Pulmonary nodule, left   . S/P CABG (coronary artery bypass graft)   . Shoulder joint pain   . Tremor   . Ureteric stone   . Urge incontinence   . Chest pain       Subjective:   Denies chest pain, SOB or palpitations. States he feels well.     Assessment:      Chest pain, atypical. Ruled out with negative troponins.      CAD - Status post CABG in 2006 with LIMA to LAD, saphenous vein graft to OM1, saphenous vein graft to first diagonal, and saphenous vein graft to distal LAD.   Nuclear stress test 07/2015 - No reversible ischemia or infarction. EF 66%   History of GERD - He has a long history of GI related chest pain which is relieved by vomiting.   History of dysphagia for about a year.   Dyslipidemia with ideal LDL lowering target of 55 or less-most recently 97.   Parkinson's syndrome   Urinary retention history   Gout    Recommendation:    Ruled out with negative troponins.    Continue ASA.    Needs better lipid control. Discussed with medicine resident - will increase statin instead of adding Zetia and repeat lipids in 8-12 weeks as an outpatient.    Continue losartan.   Ok for discharge from a cardiac standpoint. Will order outpatient follow up.      I saw and examined the patient.   My exam and findings duplicated  as documented in the note above on [name] [name].  I have personally edited and added to the text of this note to reflect my exam, impression, and recommendations.  I have personally reviewed the patient's history and 24 hour interval events, along with vitals, labs, radiology images, and additional findings found in detail within the note above.  I concur with the above documented care plan which was developed with and reviewed by me.  See above impressions/recommendations which I have edited to reflect my thoughts. He is CP free. Has been ruled out by cardiac enzymes. Can get [place] From out point of view. Agree with increasing the Statin dose. We will arrange for a follow up.     Lynford Humphrey, MD, [facility]  [facility]    Medications:      Scheduled Meds:    allopurinol, 300 mg, Oral, Daily  aspirin EC, 81 mg, Oral, Daily  atorvastatin, 20 mg, Oral, QHS  carbidopa-levodopa, 3 capsule, Oral, 5X Daily  enoxaparin, 40 mg, Subcutaneous, Daily  influenza, 0.5 mL, Intramuscular, Once  losartan, 25 mg, Oral, Daily  oxybutynin, 10 mg, Oral, Daily  pantoprazole, 40 mg, Oral, QAM AC  sertraline, 25 mg, Oral, Daily        Continuous Infusions:  . sodium chloride 50 mL/hr at 11/01/19 1944            Physical Exam:     Vitals:    11/02/19 0747   BP: 130/65   Pulse: 62   Resp: 16   Temp: 97.4 F (36.3 C)   SpO2: 98%     Temp (24hrs), Avg:97.9 F (36.6 C), Min:97.3 F (36.3 C), Max:98.5 F (36.9 C)      Telemetry reviewed SR.     Intake and Output Summary (Last 24 hours) at Date Time  No intake or output data in the 24 hours ending 11/02/19 0912    General Appearance:  Breathing comfortable, no acute distress  Head:  normocephalic  Eyes:  EOM's intact  Neck:  No  jugular venous distension  Lungs:  Clear to auscultation throughout, no wheezes, rhonchi or rales, good respiratory effort   Chest Wall:  No tenderness or deformity  Heart:  Regular rate and rhythm, S1, S2 normal, no S3, no S4, no murmur, PMI not displaced, no  rub   Abdomen:  Soft, non-tender, positive bowel sounds  Extremities:  No cyanosis, clubbing or edema  Pulses:  Equal radial pulses, 4/4 symmetric  Neurologic:  Alert and oriented x3, mood and affect normal  Musculoskeletal: normal strength and tone    Labs:      Recent Labs   Lab 11/02/19  0543 11/01/19  1910 11/01/19  1608   Troponin I 0.02 0.01 0.01             Recent Labs   Lab 11/02/19  0543   Bilirubin, Total 0.7   Protein, Total 6.0   Albumin 3.5   ALT <6   AST (SGOT) 17     Recent Labs   Lab 11/02/19  0543   Magnesium 2.0         Recent Labs   Lab 11/02/19  0543 11/01/19  1116   WBC 5.15 7.47   Hgb 11.1* 13.0   Hematocrit 31.9* 36.9*   Platelets 175 220     Recent Labs   Lab 11/02/19  0543 11/01/19  1116   Sodium 139 139   Potassium 4.2 4.4   Chloride 107 103   CO2 24 27   BUN 18 19   Creatinine 1.0 1.1   EGFR >60.0 >60.0   Glucose 87 131*   Calcium 9.3 10.1           Invalid input(s): FREET4    .No results found for: BNP     Weight Monitoring 04/06/2019 05/12/2019 06/14/2019 10/06/2019 11/01/2019   Height - - 180.3 cm 180.3 cm -   Weight 78.2 kg 76.658 kg 76.204 kg 79.017 kg 82.3 kg   Weight Method - - - - Bed Scale   BMI (calculated) - - 23.5 kg/m2 24.3 kg/m2 -         Diagnostic/Imaging:   Radiological Procedure reviewed.      Chest AP Portable    Result Date: 11/01/2019   No evidence of acute disease. [name] Peters, MD  11/01/2019 11:55 AM                   Signed by: Queen Slough, NP        [facility]  APP Spectralink [phone] (8am-12pm)  MD Spectralink [phone] (8am-5pm)  After hours, non urgent consult line [phone]  After Hours, urgent consults or to reach the on-call MD [phone]

## 2019-11-03 ENCOUNTER — Telehealth (INDEPENDENT_AMBULATORY_CARE_PROVIDER_SITE_OTHER): Payer: Self-pay

## 2019-11-03 ENCOUNTER — Encounter (INDEPENDENT_AMBULATORY_CARE_PROVIDER_SITE_OTHER): Payer: Self-pay | Admitting: Family Medicine

## 2019-11-03 ENCOUNTER — Ambulatory Visit (INDEPENDENT_AMBULATORY_CARE_PROVIDER_SITE_OTHER): Payer: No Typology Code available for payment source | Admitting: Urology

## 2019-11-03 LAB — TSH: TSH: 2.73 u[IU]/mL (ref 0.35–4.94)

## 2019-11-03 LAB — IRON PROFILE
Iron Saturation: 29 % (ref 15–50)
Iron: 66 ug/dL (ref 40–160)
TIBC: 231 ug/dL — ABNORMAL LOW (ref 261–462)
UIBC: 165 ug/dL (ref 126–382)

## 2019-11-03 LAB — LIPID PANEL
Cholesterol / HDL Ratio: 4.1
Cholesterol: 120 mg/dL (ref 0–199)
HDL: 29 mg/dL — ABNORMAL LOW (ref 40–9999)
LDL Calculated: 68 mg/dL (ref 0–99)
Triglycerides: 117 mg/dL (ref 34–149)
VLDL Calculated: 23 mg/dL (ref 10–40)

## 2019-11-03 LAB — HEMOLYSIS INDEX: Hemolysis Index: 11 (ref 0–18)

## 2019-11-03 LAB — FERRITIN: Ferritin: 114.4 ng/mL (ref 21.80–274.70)

## 2019-11-03 NOTE — Telephone Encounter (Signed)
F/u scheduled 

## 2019-11-03 NOTE — Telephone Encounter (Signed)
TCM phone attempt #1  Left message to call back

## 2019-11-06 NOTE — Telephone Encounter (Addendum)
TCM attempt #2  Pt has Hosp f/u 10/12 with Dr Alwyn Pea         TCM follow-up Onalee Hua   Discharge Date: 11/02/19 Admitted: 11/01/19     Reason for visit: chest pain     Hosp course/test results: Admitted with substernal chest pain, diaphoresis, and dyspnea that was different from his normal GERD/dysphagia pain. In ED pain was relieved with nitroglycerin tablet and negative troponins x3 along with negative EKG x2. Cardiology was consulted and will have pt schedule the lexiscan test outpatient. Atorvastatin dosage was increase from 20 to 40 for further LDL lowering and cardio protection. Will follow up with cardiology as outpatient. He will have barium swallow study outpatient. Protonix 40 mg was added for chronic reflux. No further chest pain during hospital stay.     How is (s)he feeling? Any problems? Review medication list and discharge instructions, does patient have any questions or concerns?  2 phone attempts left message to call back, pt has hospital f/u appt 10/12 Dr Alwyn Pea    Pending lab results:  none     Is follow up appt indicated?  Where and when?    Dr Alwyn Pea 11/07/19  10/19/ cardiology    Ask pt to bring medications to follow up appointment

## 2019-11-07 ENCOUNTER — Emergency Department: Payer: No Typology Code available for payment source

## 2019-11-07 ENCOUNTER — Ambulatory Visit (INDEPENDENT_AMBULATORY_CARE_PROVIDER_SITE_OTHER): Payer: Medicare Other | Admitting: Family Medicine

## 2019-11-07 ENCOUNTER — Emergency Department
Admission: EM | Admit: 2019-11-07 | Discharge: 2019-11-07 | Disposition: A | Discharge status: Home / Self Care | Payer: No Typology Code available for payment source | Attending: Emergency Medical Services | Admitting: Emergency Medical Services

## 2019-11-07 DIAGNOSIS — Z951 Presence of aortocoronary bypass graft: Secondary | ICD-10-CM

## 2019-11-07 DIAGNOSIS — Z79899 Other long term (current) drug therapy: Secondary | ICD-10-CM | POA: Insufficient documentation

## 2019-11-07 DIAGNOSIS — R079 Chest pain, unspecified: Secondary | ICD-10-CM | POA: Insufficient documentation

## 2019-11-07 DIAGNOSIS — Z7982 Long term (current) use of aspirin: Secondary | ICD-10-CM | POA: Insufficient documentation

## 2019-11-07 LAB — COMPREHENSIVE METABOLIC PANEL
ALT: <6 U/L (ref 0–55)
AST (SGOT): 23 U/L (ref 5–34)
Albumin/Globulin Ratio: 1.6 (ref 0.9–2.2)
Albumin: 4.5 g/dL (ref 3.5–5.0)
Alkaline Phosphatase: 91 U/L (ref 38–106)
Anion Gap: 9 (ref 5.0–15.0)
BUN: 18 mg/dL (ref 9–28)
Bilirubin, Total: 0.8 mg/dL (ref 0.2–1.2)
CO2: 26 mEq/L (ref 22–29)
Calcium: 10.1 mg/dL (ref 7.9–10.2)
Chloride: 103 mEq/L (ref 100–111)
Creatinine: 1.2 mg/dL (ref 0.7–1.3)
Globulin: 2.8 g/dL (ref 2.0–3.6)
Glucose: 120 mg/dL — ABNORMAL HIGH (ref 70–100)
Potassium: 3.9 mEq/L (ref 3.5–5.1)
Protein, Total: 7.3 g/dL (ref 6.0–8.3)
Sodium: 138 mEq/L (ref 136–145)

## 2019-11-07 LAB — CBC AND DIFFERENTIAL
Absolute NRBC: 0 10*3/uL (ref 0.00–0.00)
Basophils Absolute Automated: 0.05 10*3/uL (ref 0.00–0.08)
Basophils Automated: 0.6 %
Eosinophils Absolute Automated: 0.26 10*3/uL (ref 0.00–0.44)
Eosinophils Automated: 2.9 %
Hematocrit: 36.2 % — ABNORMAL LOW (ref 37.6–49.6)
Hgb: 12.8 g/dL (ref 12.5–17.1)
Immature Granulocytes Absolute: 0.05 10*3/uL (ref 0.00–0.07)
Immature Granulocytes: 0.6 %
Lymphocytes Absolute Automated: 0.41 10*3/uL — ABNORMAL LOW (ref 0.42–3.22)
Lymphocytes Automated: 4.5 %
MCH: 31.7 pg (ref 25.1–33.5)
MCHC: 35.4 g/dL (ref 31.5–35.8)
MCV: 89.6 fL (ref 78.0–96.0)
MPV: 9.4 fL (ref 8.9–12.5)
Monocytes Absolute Automated: 0.36 10*3/uL (ref 0.21–0.85)
Monocytes: 4 %
Neutrophils Absolute: 7.92 10*3/uL — ABNORMAL HIGH (ref 1.10–6.33)
Neutrophils: 87.4 %
Nucleated RBC: 0 /100 WBC (ref 0.0–0.0)
Platelets: 181 10*3/uL (ref 142–346)
RBC: 4.04 10*6/uL — ABNORMAL LOW (ref 4.20–5.90)
RDW: 15 % (ref 11–15)
WBC: 9.05 10*3/uL (ref 3.10–9.50)

## 2019-11-07 LAB — TROPONIN I
Troponin I: 0.01 ng/mL (ref 0.00–0.05)
Troponin I: 0.01 ng/mL (ref 0.00–0.05)

## 2019-11-07 LAB — GFR: EGFR: 58.7

## 2019-11-07 MED ORDER — MORPHINE SULFATE 2 MG/ML IJ/IV SOLN (WRAP)
2.0000 mg | Freq: Once | Status: AC
Start: 2019-11-07 — End: 2019-11-07
  Administered 2019-11-07: 14:00:00 2 mg via INTRAVENOUS
  Filled 2019-11-07: qty 1

## 2019-11-07 MED ORDER — NITROGLYCERIN 0.4 MG SL SUBL
0.4000 mg | SUBLINGUAL_TABLET | SUBLINGUAL | Status: DC | PRN
Start: 2019-11-07 — End: 2019-11-07
  Administered 2019-11-07 (×2): 0.4 mg via SUBLINGUAL
  Filled 2019-11-07: qty 25

## 2019-11-07 MED ORDER — ALUM & MAG HYDROXIDE-SIMETH 200-200-20 MG/5ML PO SUSP
30.0000 mL | Freq: Once | ORAL | Status: AC
Start: 2019-11-07 — End: 2019-11-07
  Administered 2019-11-07: 13:00:00 30 mL via ORAL
  Filled 2019-11-07: qty 30

## 2019-11-07 MED ORDER — SUCRALFATE 1 G PO TABS
1.0000 g | ORAL_TABLET | Freq: Four times a day (QID) | ORAL | 0 refills | Status: AC
Start: 2019-11-07 — End: ?

## 2019-11-07 NOTE — Discharge Instructions (Signed)
Chest Pain of Unclear Etiology     You have been seen for chest pain. The cause of your pain is not yet known.     Your doctor has learned about your medical history, examined you, and checked any tests that were done. Still, it is not clear why you are having pain. The doctor thinks there is only a small chance that your pain is caused by a health problem that could lead to serious harm or death. Later, your primary care doctor might do more tests or check you again.     Sometimes chest pain is caused by a health problem that can lead to death, like a:  · Heart attack.  · Injury to the large blood vessel in your body (aorta).  · Blood clot in the lung.  · Collapsed lung.      It is not likely that your pain is caused by a health problem that could lead to death if:   · Your chest pain lasts only a few seconds at a time  · You are not short of breath, nauseated (sick to your stomach), sweaty, or lightheaded  · Your pain gets worse when you twist or bend  · Your pain improves with exercise or hard work.     Chest pain is serious. It is very important that you follow up with your regular doctor.     Return here or go to the nearest Emergency Department immediately if:  · Your pain makes you short of breath, nauseated (sick to your stomach), or sweaty.  · Your pain gets worse when you walk, go up stairs, or exert yourself.  · You feel weak, lightheaded, or faint.  · It hurts to breathe.  · Your leg swells.  · Your pain or symptoms get worse   · You have new symptoms or concerns.     If you can't follow up with your doctor, or if at any time you feel you need to be rechecked or seen again, come back here or go to the nearest emergency department.

## 2019-11-07 NOTE — ED Provider Notes (Signed)
Per Dr. Quincy Sheehan, patient may be discharged if second troponin is negative.    I spoke with patient and son, updating them regarding negative second troponin.  They are comfortable going home.  They have inquired about treating pain, and I noted Carafate prescription per Dr. Quincy Sheehan, and also recommended Tylenol.    They verify that they have an appointment with cardiology on Tuesday.    They also expressed concern about constipation.  I recommended MiraLAX.     Pamala Hurry, MD  11/07/19 [phone]

## 2019-11-07 NOTE — ED Triage Notes (Signed)
Pt with complaint if chest pain that started @ 1045 today.  Pt was admitted last week with same.

## 2019-11-07 NOTE — Progress Notes (Deleted)
Date of Exam: 11/07/2019 7:34 AM        Patient ID: [name] [name] is a 79 y.o. male.  Physician: Vanessa Ralphs, MD        Chief Complaint:    No chief complaint on file.            HPI:    hx of 4-vessel CABG, parkinson's, and dysphagia presented to the ER on 10/6 with substernal chest pain, diaphoresis, and dyspnea     relieved with nitroglycerin tablet in the ED and he has negative troponins x3 along with negative EKG x2 and stable vital signs     Cardiology consulted, plan for outpatient lexiscan    Increased atorvastatin from 20mg  to 40mg     Chronic dysphagia- barium study scheduled outpatient  Added protonix 40mg  for reflux          Problem List:    Patient Active Problem List   Diagnosis   . Parkinson's disease   . Diabetes mellitus type 2, controlled   . Mild episode of recurrent major depressive disorder   . Neutrophilic leukocytosis   . Low back pain   . Hypertension   . Hepatic cyst   . Dyslipidemia   . Gouty arthropathy   . Diffuse esophageal spasm   . Depression   . CAD (coronary artery disease)   . Anosmia   . Benign essential hypertension   . Angina pectoris   . Nocturia   . Optic neuritis   . Other hyperlipidemia   . Pulmonary nodule, left   . S/P CABG (coronary artery bypass graft)   . Shoulder joint pain   . Tremor   . Ureteric stone   . Urge incontinence   . Chest pain   . Normocytic anemia   . Esophageal dysphagia             Current Meds:    No outpatient medications have been marked as taking for the 11/07/19 encounter (Appointment) with Vanessa Ralphs, MD.          Allergies:    No Known Allergies       Past Surgical History:    Past Surgical History:   Procedure Laterality Date   . CHOLECYSTECTOMY  1990s   . CORONARY ARTERY BYPASS GRAFT  1990s   . FEMORAL BYPASS  1994           Family History:    No family history on file.        Social History:    Social History     Tobacco Use   . Smoking status: Never Smoker   . Smokeless tobacco: Never Used   Vaping Use   .  Vaping Use: Never used   Substance Use Topics   . Alcohol use: Not Currently   . Drug use: Never          The following sections were reviewed this encounter by the provider:            Vital Signs:    There were no vitals taken for this visit.         ROS:    See HPI          Physical Exam:    Gen: well-appearing, awake and alert, no acute distress  Head: NC/AT  Ears: normal external ears, normal hearing  Eyes: PERRLA, EOMI, no conjunctival injection, no scleral icterus  Nose: no nasal discharge, no lesions  Throat: moist oral mucosa, no posterior pharynx  erythema or exudates  Neck: supple, no cervical LAD  Heart: RRR, no murmurs, rubs, or gallops  Lungs: normal work of breathing, speaking in full sentences without difficulty, CTAB, no wheezing, rhonchi, or rales  Abd: soft, non-distended, non-tender to palpation, no organomegaly, BS+  Extremities: moves all extremities without difficulty, no LE swelling, warm  Skin: no rashes  Psych: normal mood and affect, clear speech ***          Assessment    There are no diagnoses linked to this encounter.          Plan    There are no Patient Instructions on file for this visit.        Follow-up:    No follow-ups on file.         Vanessa Ralphs, MD

## 2019-11-07 NOTE — ED Provider Notes (Signed)
History     Chief Complaint   Patient presents with   . Chest Pain     At 1045 patient noted anterior chest pain he has had similar episode in the past denied other associated symptoms it may felt worse when he tried to lay back.  He was admitted there was plans to reevaluate through cardiology with an exercise stress test as an outpatient he sees [facility] in [place] [facility].  Current symptoms history per patient        Past Medical History:   Diagnosis Date   . Parkinsons        Past Surgical History:   Procedure Laterality Date   . CHOLECYSTECTOMY  1990s   . CORONARY ARTERY BYPASS GRAFT  1990s   . FEMORAL BYPASS  1994       History reviewed. No pertinent family history.    Social  Social History     Tobacco Use   . Smoking status: Never Smoker   . Smokeless tobacco: Never Used   Vaping Use   . Vaping Use: Never used   Substance Use Topics   . Alcohol use: Not Currently   . Drug use: Never       .     No Known Allergies    Home Medications     Med List Status: In Progress Set By: [name] Stallion, RN at 11/07/2019 12:01 PM                allopurinol (ZYLOPRIM) 300 MG tablet     TAKE 1 TABLET BY MOUTH EVERY DAY     Ascorbic Acid (VITAMIN C PO)     Take 1,000 mg by mouth     aspirin EC 81 MG EC tablet     Take 81 mg by mouth daily        atorvastatin (LIPITOR) 20 MG tablet     Take 2 tablets (40 mg total) by mouth nightly     carbidopa-levodopa (Rytary) 36.25-145 MG Cap CR per capsule     Take 3 capsules by mouth every 4 (four) hours At 0800, 1200, 1600, 2000, 0000     Coenzyme Q10 (CoQ10) 200 MG Cap     Take 400 mg by mouth     glucosamine-chondroitin 500-400 MG tablet     Take 1 tablet by mouth     losartan (COZAAR) 25 MG tablet     TAKE 1 TABLET BY MOUTH EVERY DAY     oxybutynin (DITROPAN-XL) 10 MG 24 hr tablet     Take 1 tablet (10 mg total) by mouth daily     pantoprazole (PROTONIX) 40 MG tablet     Take 1 tablet (40 mg total) by mouth every morning before breakfast     sertraline (ZOLOFT) 25 MG  tablet     TAKE 1 TABLET BY MOUTH EVERY DAY           Review of Systems   Constitutional: Negative for fever.   HENT: Negative for congestion.    Respiratory: Negative for shortness of breath.    Cardiovascular: Positive for chest pain.   Gastrointestinal: Negative for abdominal pain.   Genitourinary: Negative for dysuria.   Musculoskeletal: Negative.    Skin: Negative for rash.   Neurological: Negative for headaches.   Psychiatric/Behavioral: Negative for dysphoric mood.   All other systems reviewed and are negative.      Physical Exam    BP: 179/77, Heart Rate: 75, Temp: 98.2 F (  36.8 C), Resp Rate: 18, SpO2: 98 %     Physical Exam  Constitutional:       Comments: Well Appearing   HENT:      Head: Normocephalic.      Mouth/Throat:      Mouth: Mucous membranes are moist.   Eyes:      Conjunctiva/sclera: Conjunctivae normal.   Neck:      Comments: Normal Inspection  Cardiovascular:      Rate and Rhythm: Normal rate and regular rhythm.      Heart sounds: Normal heart sounds.   Pulmonary:      Breath sounds: Normal breath sounds.   Abdominal:      General: Bowel sounds are normal.      Palpations: Abdomen is soft.      Tenderness: There is no abdominal tenderness.   Musculoskeletal:         General: No tenderness.      Comments: Normal Upper Extremity Inspection   Skin:     General: Skin is warm and dry.   Neurological:      General: No focal deficit present.      Mental Status: He is alert.   Psychiatric:         Mood and Affect: Mood normal.           MDM and ED Course     ED Medication Orders (From admission, onward)    Start Ordered     Status Ordering Provider    11/07/19 1333 11/07/19 1332  morphine injection 2 mg  Once     Route: Intravenous  Ordered Dose: 2 mg     Last MAR action: Given [name] [name] J    11/07/19 1320 11/07/19 1319  alum & mag hydroxide-simethicone (MAALOX PLUS) 200-200-20 mg/5 mL suspension 30 mL  Once     Route: Oral  Ordered Dose: 30 mL     Last MAR action: Given Pete Glatter J     11/07/19 1221 11/07/19 1221    Every 5 min PRN     Route: Sublingual  Ordered Dose: 0.4 mg     Discontinued Chase Picket             MDM      ED Course as of Nov 11 106   Tue Nov 07, 2019   1328 Cx ramakrishna recc quick r/o with atypical features and could expedite outpt eval      [PM]   1430 Patient feels better after 2 mg of morphine.  Advised I would add on Carafate to his outpatient regimen of Protonix that was started a week ago.  He points very low chest to epigastric area I suspect that this is gastrointestinal.  Given recent hospitalization negative serial troponin and ability to expedite outpatient work-up plan will be to get result of second troponin approximately 330 4:00.    [PM]      ED Course User Index  [PM] Chase Picket, MD       I did advise patient that if he has any exertional symptoms worsening symptoms symptoms associated with shortness of breath to return here.      Procedures    Clinical Impression & Disposition     Clinical Impression  Final diagnoses:   Chest pain at rest        ED Disposition     ED Disposition Condition Date/Time Comment    Discharge  Tue Nov 07, 2019  4:35 PM [name] [name]  discharge to home/self care.    Condition at disposition: Stable           Discharge Medication List as of 11/07/2019  4:53 PM      START taking these medications    Details   sucralfate (CARAFATE) 1 g tablet Take 1 tablet (1 g total) by mouth 4 (four) times daily, Starting Tue 11/07/2019, E-Rx                       Chase Picket, MD  11/11/19 [id number]

## 2019-11-08 LAB — ECG 12-LEAD
Atrial Rate: 74 {beats}/min
Atrial Rate: 96 {beats}/min
P Axis: 61 degrees
P Axis: 64 degrees
P-R Interval: 172 ms
P-R Interval: 170 ms
Q-T Interval: 392 ms
Q-T Interval: 366 ms
QRS Duration: 90 ms
QRS Duration: 84 ms
QTC Calculation (Bezet): 435 ms
QTC Calculation (Bezet): 462 ms
R Axis: 52 degrees
R Axis: 45 degrees
T Axis: 240 degrees
T Axis: 175 degrees
Ventricular Rate: 74 {beats}/min
Ventricular Rate: 96 {beats}/min

## 2019-11-10 ENCOUNTER — Encounter (INDEPENDENT_AMBULATORY_CARE_PROVIDER_SITE_OTHER): Payer: Self-pay | Admitting: Cardiovascular Disease

## 2019-11-10 ENCOUNTER — Inpatient Hospital Stay
Admission: RE | Admit: 2019-11-10 | Discharge: 2019-11-10 | Disposition: A | Discharge status: Home / Self Care | Payer: No Typology Code available for payment source | Source: Ambulatory Visit | Attending: Cardiovascular Disease | Admitting: Cardiovascular Disease

## 2019-11-10 DIAGNOSIS — E785 Hyperlipidemia, unspecified: Secondary | ICD-10-CM | POA: Insufficient documentation

## 2019-11-10 DIAGNOSIS — R079 Chest pain, unspecified: Secondary | ICD-10-CM

## 2019-11-10 DIAGNOSIS — Z951 Presence of aortocoronary bypass graft: Secondary | ICD-10-CM | POA: Insufficient documentation

## 2019-11-10 MED ORDER — REGADENOSON 0.4 MG/5ML IV SOLN
0.4000 mg | Freq: Once | INTRAVENOUS | Status: AC | PRN
Start: 2019-11-10 — End: 2019-11-10
  Administered 2019-11-10: 08:00:00 0.4 mg via INTRAVENOUS
  Filled 2019-11-10: qty 5

## 2019-11-10 MED ORDER — TECHNETIUM TC 99M TETROFOSMIN IV KIT
35.0000 | PACK | Freq: Once | INTRAVENOUS | Status: AC | PRN
Start: 2019-11-10 — End: 2019-11-10
  Administered 2019-11-10: 08:00:00 35 via INTRAVENOUS
  Filled 2019-11-10: qty 100

## 2019-11-10 MED ORDER — TECHNETIUM TC 99M TETROFOSMIN IV KIT
10.0000 | PACK | Freq: Once | INTRAVENOUS | Status: AC | PRN
Start: 2019-11-10 — End: 2019-11-10
  Administered 2019-11-10: 07:00:00 10 via INTRAVENOUS
  Filled 2019-11-10: qty 100

## 2019-11-14 ENCOUNTER — Encounter (INDEPENDENT_AMBULATORY_CARE_PROVIDER_SITE_OTHER): Payer: Self-pay | Admitting: Physician Assistant

## 2019-11-14 ENCOUNTER — Ambulatory Visit (INDEPENDENT_AMBULATORY_CARE_PROVIDER_SITE_OTHER): Payer: No Typology Code available for payment source | Admitting: Physician Assistant

## 2019-11-14 ENCOUNTER — Other Ambulatory Visit (INDEPENDENT_AMBULATORY_CARE_PROVIDER_SITE_OTHER): Payer: Self-pay

## 2019-11-14 VITALS — BP 148/80 | HR 80 | Ht 71.0 in | Wt 158.0 lb

## 2019-11-14 DIAGNOSIS — R079 Chest pain, unspecified: Secondary | ICD-10-CM

## 2019-11-14 DIAGNOSIS — Z951 Presence of aortocoronary bypass graft: Secondary | ICD-10-CM

## 2019-11-14 MED ORDER — NITROGLYCERIN 0.4 MG SL SUBL
0.4000 mg | SUBLINGUAL_TABLET | SUBLINGUAL | 2 refills | Status: AC | PRN
Start: 2019-11-14 — End: ?

## 2019-11-14 NOTE — Progress Notes (Signed)
[facility] OFFICE PROGRESS NOTE    [facility] OFFICE -CARDIOLOGY  [address]  [place] [address]  Dept: [phone]  Dept Fax: [phone]       Patient Name: [name] [name],[name] [name]    Date of Visit:  November 14, 2019  Date of Birth: [birth year]  AGE: 79 y.o.  Medical Record #: [id number]  Requesting Physician: [name], MD      CHIEF COMPLAINT: Chest Pain [facility] follow-up)      HISTORY OF PRESENT ILLNESS:    He is a pleasant 79 y.o. male who presents today for hospital follow-up visit, accompanied by his son who assists w/ history. Pt is a 79y/o M with Parkinson's, HTN, and CAD s/p prior bypass.  He presented to [facility] on 11/01/2019 with a lower centralized chest pain that began after breakfast that morning.  He describes it as a "squeezing of my heart."  No associated or exertional symptoms.    ER work-up was unremarkable, ruling out for ACS with negative EKG and enzymes.  Follow-up Lexiscan MPI study showed normal myocardial perfusion and function.  He is also followed up with GI and had a barium swallow study, results pending.    He has felt well since that day, citing no further chest discomforts whatsoever.  At least twice weekly he works with an aide/assistant on some regular exercises.  He navigates the stairs regularly and without issue.  No further chest pain/pressure/tightness. He denies any dyspnea at rest or exertion, palpitations, peripheral edema, orthopnea, or PND.  BP mildly elevated today, but patient did not take his medications last night or this morning.  He is otherwise compliant with regimen.      PAST MEDICAL HISTORY: He has a past medical history of Coronary artery disease (bypass 26 years ago), Hyperlipidemia, Hypertension (30 years ago), Parkinsons, and Stented coronary artery. He has a past surgical history that includes Femoral bypass (1994); Cholecystectomy (1990s); and Coronary artery bypass graft  (1990s).    ALLERGIES: No Known Allergies    MEDICATIONS:   Current Outpatient Medications   Medication Sig   . allopurinol (ZYLOPRIM) 300 MG tablet TAKE 1 TABLET BY MOUTH EVERY DAY   . Ascorbic Acid (VITAMIN C PO) Take 1,000 mg by mouth   . aspirin EC 81 MG EC tablet Take 81 mg by mouth daily      . atorvastatin (LIPITOR) 20 MG tablet Take 2 tablets (40 mg total) by mouth nightly   . carbidopa-levodopa (Rytary) 36.25-145 MG Cap CR per capsule Take 3 capsules by mouth every 4 (four) hours At 0800, 1200, 1600, 2000, 0000   . Coenzyme Q10 (CoQ10) 200 MG Cap Take 400 mg by mouth   . glucosamine-chondroitin 500-400 MG tablet Take 1 tablet by mouth   . losartan (COZAAR) 25 MG tablet TAKE 1 TABLET BY MOUTH EVERY DAY   . oxybutynin (DITROPAN-XL) 10 MG 24 hr tablet Take 1 tablet (10 mg total) by mouth daily   . pantoprazole (PROTONIX) 40 MG tablet Take 1 tablet (40 mg total) by mouth every morning before breakfast   . sertraline (ZOLOFT) 25 MG tablet TAKE 1 TABLET BY MOUTH EVERY DAY   . sucralfate (CARAFATE) 1 g tablet Take 1 tablet (1 g total) by mouth 4 (four) times daily        FAMILY HISTORY: family history includes Hypertension in his father and mother.    SOCIAL HISTORY: He reports that  he has never smoked. He has never used smokeless tobacco. He reports current alcohol use of about 2.0 standard drinks of alcohol per week. He reports that he does not use drugs.    PHYSICAL EXAMINATION    Visit Vitals  BP 148/80 (BP Site: Left arm, Patient Position: Sitting, Cuff Size: Medium)   Pulse 80   Ht 1.803 m (5\' 11" )   Wt 71.7 kg (158 lb)   BMI 22.04 kg/m       General Appearance:  A well-appearing male in no acute distress.    Skin: Warm and dry to touch, no apparent skin lesions, or masses noted.  Head: Normocephalic, normal hair pattern, no masses or tenderness   Eyes: EOMS Intact, PERRL, conjunctivae and lids unremarkable.  ENT: Ears, Nose and throat reveal no gross abnormalities.  No pallor or cyanosis.  Dentition good.    Neck: JVP normal, no carotid bruit, thyroid not enlarged   Chest: Clear to auscultation bilaterally with good air movement and respiratory effort and no wheezes, rales, or rhonchi   Cardiovascular: Regular rhythm, S1 normal, S2 normal, No S3 or S4. Apical impulse not displaced. No murmur. No gallops or rubs detected   Abdomen: Soft, nontender, nondistended, with normoactive bowel sounds. No organomegaly.  No pulsatile masses, or bruits.   Extremities: Warm without edema. No clubbing, or cyanosis. All peripheral pulses are full and equal.   Neuro: Alert and oriented x3. No gross motor or sensory deficits noted, affect appropriate.          LABS:   Lab Results   Component Value Date    WBC 9.05 11/07/2019    HGB 12.8 11/07/2019    HCT 36.2 (L) 11/07/2019    PLT 181 11/07/2019     Lab Results   Component Value Date    GLU 120 (H) 11/07/2019    BUN 18 11/07/2019    CREAT 1.2 11/07/2019    NA 138 11/07/2019    K 3.9 11/07/2019    CL 103 11/07/2019    CO2 26 11/07/2019    AST 23 11/07/2019    ALT <6 11/07/2019     Lab Results   Component Value Date    MG 2.0 11/02/2019    TSH 2.73 11/02/2019    HGBA1C 4.6 11/02/2019     Lab Results   Component Value Date    CHOL 120 11/02/2019    TRIG 117 11/02/2019    HDL 29 (L) 11/02/2019    LDL 68 11/02/2019       IMPRESSION:   CAD - s/p CABG in 2006 with LIMA to LAD, SVG to OM1, SVG to D1, and SVG to dLAD.   ER visit 10/2019 with atypical chest pain, atypical. Ruled out for ACS.   Lexi MPI 11/01/2019 with nl myocardial perfusion and function.    GERD and a long history of GI related chest pain which is relieved by vomiting   Dyslipidemia - LDL 68mg /dL by labs 40/09/8117.   Parkinson's syndrome   Urinary retention history   Gout    PLAN:  1. Reassurance from the cardiac perspective given the ACS rule out, reassuring stress testing, and atypical nature of his symptoms.  2. Continue current cardiac regimen as prescribed.    3. Overall emphasis on heart-healthy diet, more regular  activity/exercise as tolerated.  4. Regular labs and follow-ups with PCP and other specialists as ongoing. Please forward results.  5. Barring any change in cardiac symptoms, follow-up with [place] in 83mo  as planned- or sooner if needed.                                                 Orders Placed This Encounter   Procedures   . OFFICE VISIT 30 MIN ([facility] [place])       No orders of the defined types were placed in this encounter.      SIGNED:    Dario Ave, PA          This note was generated by the Dragon speech recognition and may contain errors or omissions not intended by the user. Grammatical errors, random word insertions, deletions, pronoun errors, and incomplete sentences are occasional consequences of this technology due to software limitations. Not all errors are caught or corrected. If there are questions or concerns about the content of this note or information contained within the body of this dictation, they should be addressed directly with the author for clarification.

## 2019-11-16 ENCOUNTER — Other Ambulatory Visit (INDEPENDENT_AMBULATORY_CARE_PROVIDER_SITE_OTHER): Payer: Self-pay | Admitting: Family Medicine

## 2019-11-16 DIAGNOSIS — M109 Gout, unspecified: Secondary | ICD-10-CM

## 2019-11-27 ENCOUNTER — Emergency Department: Payer: No Typology Code available for payment source

## 2019-11-27 ENCOUNTER — Inpatient Hospital Stay
Admission: EM | Admit: 2019-11-27 | Discharge: 2019-12-01 | DRG: 871 | Disposition: A | Discharge status: Hospice | Payer: No Typology Code available for payment source | Attending: Family Medicine | Admitting: Family Medicine

## 2019-11-27 DIAGNOSIS — Z20822 Contact with and (suspected) exposure to covid-19: Secondary | ICD-10-CM | POA: Diagnosis present

## 2019-11-27 DIAGNOSIS — E1151 Type 2 diabetes mellitus with diabetic peripheral angiopathy without gangrene: Secondary | ICD-10-CM | POA: Diagnosis present

## 2019-11-27 DIAGNOSIS — J850 Gangrene and necrosis of lung: Secondary | ICD-10-CM | POA: Diagnosis present

## 2019-11-27 DIAGNOSIS — Z955 Presence of coronary angioplasty implant and graft: Secondary | ICD-10-CM

## 2019-11-27 DIAGNOSIS — K224 Dyskinesia of esophagus: Secondary | ICD-10-CM | POA: Diagnosis present

## 2019-11-27 DIAGNOSIS — R1312 Dysphagia, oropharyngeal phase: Secondary | ICD-10-CM | POA: Insufficient documentation

## 2019-11-27 DIAGNOSIS — D649 Anemia, unspecified: Secondary | ICD-10-CM | POA: Diagnosis present

## 2019-11-27 DIAGNOSIS — J869 Pyothorax without fistula: Secondary | ICD-10-CM | POA: Diagnosis present

## 2019-11-27 DIAGNOSIS — F0280 Dementia in other diseases classified elsewhere without behavioral disturbance: Secondary | ICD-10-CM | POA: Diagnosis present

## 2019-11-27 DIAGNOSIS — K223 Perforation of esophagus: Secondary | ICD-10-CM | POA: Diagnosis present

## 2019-11-27 DIAGNOSIS — E1121 Type 2 diabetes mellitus with diabetic nephropathy: Secondary | ICD-10-CM | POA: Diagnosis present

## 2019-11-27 DIAGNOSIS — Z66 Do not resuscitate: Secondary | ICD-10-CM | POA: Diagnosis present

## 2019-11-27 DIAGNOSIS — I2510 Atherosclerotic heart disease of native coronary artery without angina pectoris: Secondary | ICD-10-CM | POA: Diagnosis present

## 2019-11-27 DIAGNOSIS — Z8249 Family history of ischemic heart disease and other diseases of the circulatory system: Secondary | ICD-10-CM

## 2019-11-27 DIAGNOSIS — Z515 Encounter for palliative care: Secondary | ICD-10-CM

## 2019-11-27 DIAGNOSIS — G20 Parkinson's disease: Secondary | ICD-10-CM | POA: Diagnosis present

## 2019-11-27 DIAGNOSIS — R6521 Severe sepsis with septic shock: Secondary | ICD-10-CM | POA: Diagnosis present

## 2019-11-27 DIAGNOSIS — J189 Pneumonia, unspecified organism: Secondary | ICD-10-CM | POA: Diagnosis present

## 2019-11-27 DIAGNOSIS — C159 Malignant neoplasm of esophagus, unspecified: Secondary | ICD-10-CM | POA: Diagnosis present

## 2019-11-27 DIAGNOSIS — J939 Pneumothorax, unspecified: Secondary | ICD-10-CM

## 2019-11-27 DIAGNOSIS — F329 Major depressive disorder, single episode, unspecified: Secondary | ICD-10-CM | POA: Diagnosis present

## 2019-11-27 DIAGNOSIS — Z781 Physical restraint status: Secondary | ICD-10-CM

## 2019-11-27 DIAGNOSIS — Z7982 Long term (current) use of aspirin: Secondary | ICD-10-CM

## 2019-11-27 DIAGNOSIS — E785 Hyperlipidemia, unspecified: Secondary | ICD-10-CM | POA: Diagnosis present

## 2019-11-27 DIAGNOSIS — M109 Gout, unspecified: Secondary | ICD-10-CM | POA: Diagnosis present

## 2019-11-27 DIAGNOSIS — Z951 Presence of aortocoronary bypass graft: Secondary | ICD-10-CM

## 2019-11-27 DIAGNOSIS — A419 Sepsis, unspecified organism: Secondary | ICD-10-CM

## 2019-11-27 DIAGNOSIS — K2289 Other specified disease of esophagus: Secondary | ICD-10-CM | POA: Diagnosis present

## 2019-11-27 DIAGNOSIS — A4189 Other specified sepsis: Principal | ICD-10-CM | POA: Diagnosis present

## 2019-11-27 DIAGNOSIS — Z79899 Other long term (current) drug therapy: Secondary | ICD-10-CM

## 2019-11-27 DIAGNOSIS — I472 Ventricular tachycardia: Secondary | ICD-10-CM | POA: Diagnosis not present

## 2019-11-27 DIAGNOSIS — R1314 Dysphagia, pharyngoesophageal phase: Secondary | ICD-10-CM | POA: Diagnosis present

## 2019-11-27 DIAGNOSIS — E872 Acidosis, unspecified: Secondary | ICD-10-CM

## 2019-11-27 DIAGNOSIS — R1310 Dysphagia, unspecified: Secondary | ICD-10-CM

## 2019-11-27 DIAGNOSIS — D72825 Bandemia: Secondary | ICD-10-CM

## 2019-11-27 DIAGNOSIS — I10 Essential (primary) hypertension: Secondary | ICD-10-CM | POA: Diagnosis present

## 2019-11-27 DIAGNOSIS — J948 Other specified pleural conditions: Secondary | ICD-10-CM | POA: Diagnosis present

## 2019-11-27 DIAGNOSIS — N179 Acute kidney failure, unspecified: Secondary | ICD-10-CM | POA: Diagnosis present

## 2019-11-27 DIAGNOSIS — R296 Repeated falls: Secondary | ICD-10-CM | POA: Diagnosis present

## 2019-11-27 DIAGNOSIS — F05 Delirium due to known physiological condition: Secondary | ICD-10-CM | POA: Diagnosis not present

## 2019-11-27 DIAGNOSIS — J9601 Acute respiratory failure with hypoxia: Secondary | ICD-10-CM | POA: Diagnosis present

## 2019-11-27 DIAGNOSIS — H469 Unspecified optic neuritis: Secondary | ICD-10-CM | POA: Diagnosis present

## 2019-11-27 HISTORY — DX: Anemia, unspecified: D64.9

## 2019-11-27 HISTORY — DX: Type 2 diabetes mellitus with diabetic nephropathy: E11.21

## 2019-11-27 HISTORY — DX: Other specified diseases of liver: K76.89

## 2019-11-27 HISTORY — DX: Type 2 diabetes mellitus without complications: E11.9

## 2019-11-27 LAB — COMPREHENSIVE METABOLIC PANEL
ALT: <6 U/L (ref 0–55)
AST (SGOT): 16 U/L (ref 5–34)
Albumin/Globulin Ratio: 0.8 — ABNORMAL LOW (ref 0.9–2.2)
Albumin: 2.6 g/dL — ABNORMAL LOW (ref 3.5–5.0)
Alkaline Phosphatase: 80 U/L (ref 38–106)
Anion Gap: 25 — ABNORMAL HIGH (ref 5.0–15.0)
BUN: 47 mg/dL — ABNORMAL HIGH (ref 9–28)
Bilirubin, Total: 1.1 mg/dL (ref 0.2–1.2)
CO2: 16 mEq/L — ABNORMAL LOW (ref 22–29)
Calcium: 10.1 mg/dL (ref 7.9–10.2)
Chloride: 93 mEq/L — ABNORMAL LOW (ref 100–111)
Creatinine: 2.5 mg/dL — ABNORMAL HIGH (ref 0.7–1.3)
Globulin: 3.4 g/dL (ref 2.0–3.6)
Glucose: 190 mg/dL — ABNORMAL HIGH (ref 70–100)
Potassium: 3.3 mEq/L — ABNORMAL LOW (ref 3.5–5.1)
Protein, Total: 6 g/dL (ref 6.0–8.3)
Sodium: 134 mEq/L — ABNORMAL LOW (ref 136–145)

## 2019-11-27 LAB — GFR: EGFR: 25.1

## 2019-11-27 LAB — LACTIC ACID, PLASMA: Lactic Acid: 9.6 mmol/L (ref 0.2–2.0)

## 2019-11-27 MED ORDER — SODIUM CHLORIDE 0.9 % IV BOLUS
1000.0000 mL | Freq: Once | INTRAVENOUS | Status: AC
Start: 2019-11-27 — End: 2019-11-28
  Administered 2019-11-27: 1000 mL via INTRAVENOUS

## 2019-11-27 MED ORDER — TETANUS-DIPHTH-ACELL PERTUSSIS 5-2.5-18.5 LF-MCG/0.5 IM SUSP
0.5000 mL | Freq: Once | INTRAMUSCULAR | Status: DC
Start: 2019-11-27 — End: 2019-11-28

## 2019-11-27 MED ORDER — STERILE WATER FOR INJECTION IJ SOLN
4.5000 g | Freq: Once | INTRAVENOUS | Status: AC
Start: 2019-11-27 — End: 2019-11-27
  Administered 2019-11-27: 4.5 g via INTRAVENOUS
  Filled 2019-11-27: qty 20

## 2019-11-27 MED ORDER — VANCOMYCIN 1000 MG IN 250 ML NS IVPB VIAL-MATE (CNR)
1000.0000 mg | Freq: Once | INTRAVENOUS | Status: AC
Start: 2019-11-27 — End: 2019-11-28
  Administered 2019-11-27: 1000 mg via INTRAVENOUS
  Filled 2019-11-27: qty 250

## 2019-11-27 NOTE — ED Triage Notes (Signed)
Cough and hacking/wretching since Saturday, today he's been "out of it", 3 falls today, very weak, hasn't eaten since Saturday, very little PO intake.

## 2019-11-27 NOTE — ED Provider Notes (Signed)
History     Chief Complaint   Patient presents with   . Fatigue   . Fall   . Loss of appetite   [name] [name] is a 79 y.o. male who is brought in by his son because he has had a cough for the last 2 weeks which seems to be getting worse.  He was confused last night and slept most of today.  He has fallen 3 times today resulting in numerous scrapes to his legs.  He has not had any fever, vomiting, diarrhea according to the son.      This patient is quite ill so most of the history comes from his son.    Past Medical History:   Diagnosis Date   . 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 Diabetes mellitus type 2, controlled    . Diabetic nephropathy associated with type 2 diabetes mellitus    . Diabetic nephropathy associated with type 2 diabetes mellitus    . Hepatic cyst    . Hyperlipidemia    . Hypertension 30 years ago   . Normocytic anemia    . Parkinsons    . Stented coronary artery        Past Surgical History:   Procedure Laterality Date   . CHOLECYSTECTOMY  1990s   . CORONARY ARTERY BYPASS GRAFT  1990s   . FEMORAL BYPASS  1994       Family History   Problem Relation Age of Onset   . Hypertension Mother    . Hypertension Father        Social  Social History     Tobacco Use   . Smoking status: Never Smoker   . Smokeless tobacco: Never Used   Vaping Use   . Vaping Use: Never used   Substance Use Topics   . Alcohol use: Yes     Alcohol/week: 2.0 standard drinks     Types: 2 Cans of beer per week   . Drug use: Never       .     No Known Allergies    Home Medications     Med List Status: Complete Set By: Vinie Sill, RN at 11/28/2019  2:03 AM                allopurinol (ZYLOPRIM) 300 MG tablet     TAKE 1 TABLET BY MOUTH EVERY DAY     Ascorbic Acid (VITAMIN C PO)     Take 1,000 mg by mouth     aspirin EC 81 MG EC tablet     Take 81 mg by mouth daily        atorvastatin (LIPITOR) 20 MG tablet     Take 2 tablets (40 mg total) by mouth nightly     carbidopa-levodopa (Rytary) 36.25-145 MG Cap CR per  capsule     Take 3 capsules by mouth every 4 (four) hours At 0800, 1200, 1600, 2000, 0000     Coenzyme Q10 (CoQ10) 200 MG Cap     Take 400 mg by mouth     glucosamine-chondroitin 500-400 MG tablet     Take 1 tablet by mouth     losartan (COZAAR) 25 MG tablet     TAKE 1 TABLET BY MOUTH EVERY DAY     nitroglycerin (NITROSTAT) 0.4 MG SL tablet     Place 1 tablet (0.4 mg total) under the tongue every 5 (five) minutes as needed for Chest pain  oxybutynin (DITROPAN-XL) 10 MG 24 hr tablet     Take 1 tablet (10 mg total) by mouth daily     pantoprazole (PROTONIX) 40 MG tablet     Take 1 tablet (40 mg total) by mouth every morning before breakfast     sertraline (ZOLOFT) 25 MG tablet     TAKE 1 TABLET BY MOUTH EVERY DAY     sucralfate (CARAFATE) 1 g tablet     Take 1 tablet (1 g total) by mouth 4 (four) times daily           Review of Systems   Unable to perform ROS: Acuity of condition   Constitutional: Negative for fever.   Respiratory: Positive for cough and shortness of breath.    Gastrointestinal: Negative for diarrhea and vomiting.   All other systems reviewed and are negative.      Physical Exam    BP: (!) 61/40, Heart Rate: (!) 110, Temp: 97.5 F (36.4 C), Resp Rate: 20, SpO2: (!) 84 %, Weight: 69.6 kg     Physical Exam  CONSTITUTIONAL   Patient is afebrile, Vital signs reviewed, patient is tachycardic and hypotensive and hypoxic. Alert.  HEAD  Atraumatic, Normocephalic.  EYES   Eyes are normal to inspection.  ENT  Ear examination normal, Posterior pharynx normal, Mouth normal to inspection.   NECK  No meningeal signs, Cervical spine nontender  RESPIRATORY CHEST tachypneic but no abnormal lung sounds are heard.  CARDIOVASCULAR   Tachycardic but regular, No murmurs.  ABDOMEN   Abdomen is nontender, No masses, Bowel sounds normal, No distension, No peritoneal signs  BACK   There is no CVA tenderness, There is no tenderness to palpation, Normal inspection.  UPPER EXTREMITY   Inspection normal.  LOWER EXTREMITY    Numerous abrasions are noted to the shins..  NEURO   Speech normal, Memory normal.  SKIN   Skin is warm, Skin is dry, Skin is normal color.  LYMPHATIC   No adenopathy in neck.        MDM and ED Course     ED Medication Orders (From admission, onward)    Start Ordered     Status Ordering Provider    11/29/19 0000 11/28/19 0133    Every 24 hours        Note to Pharmacy: Please dose per your protocol.   Route: Intravenous  Ordered Dose: 1,000 mg     Discontinued [name], MILES    11/28/19 0900 11/28/19 0110  heparin (porcine) injection 5,000 Units  Every 8 hours scheduled        Route: Subcutaneous  Ordered Dose: 5,000 Units     Last MAR action: Given [name], MILES    11/28/19 0900 11/28/19 0110    Every 12 hours scheduled        Route: Oral  Ordered Dose: 1 tablet     Discontinued [name], MILES    11/28/19 0745 11/28/19 0110    4 times daily - before meals and at bedtime        Route: per NG tube  Ordered Dose: 1 g     Discontinued [name], MILES    11/28/19 0134 11/28/19 0133    Every 8 hours        Route: Intravenous  Ordered Dose: 4.5 g     Discontinued [name], MILES    11/28/19 0045 11/28/19 0044  norepinephrine (LEVOPHED) 8 mg in dextrose 5% 100 mL infusion  Continuous  Route: Intravenous  Ordered Dose: 10-300 mcg/min     Last MAR action: Canceled Entry Melida Gimenez    11/28/19 0036 11/28/19 0036  sodium chloride 0.9 % bolus 1,000 mL  Once        Route: Intravenous  Ordered Dose: 1,000 mL     Last MAR action: [employer]	[place], Novella Abraha    11/27/19 2349 11/27/19 2348    Once        Route: Intramuscular  Ordered Dose: 0.5 mL     Discontinued Tahliyah Anagnos    11/27/19 2326 11/27/19 2325  piperacillin-tazobactam (ZOSYN) 4.5 g in sterile water (preservative free) 20 mL IV push injection  Once        Route: Intravenous  Ordered Dose: 4.5 g     Last MAR action: Given Melida Gimenez    11/27/19 2325 11/27/19 2325  vancomycin (VANCOCIN) 1000 mg in NS 250 mL IVPB vial-mate  Once        Route: Intravenous  Ordered Dose:  1,000 mg     Last MAR action: Stopped Brooke Dare, Armenta Erskin    11/27/19 2319 11/27/19 2318  sodium chloride 0.9 % bolus 1,000 mL  Once        Route: Intravenous  Ordered Dose: 1,000 mL     Last MAR action: Stopped Brooke Dare, Deveney Bayon    11/27/19 2316 11/27/19 2315  sodium chloride 0.9 % bolus 1,000 mL  Once        Route: Intravenous  Ordered Dose: 1,000 mL     Last MAR action: Stopped Jawaun Celmer             MDM    EKG: Sinus tachy @102 , ST and T-wave abnormality in lateral leads.     0040Marvis Moeller (Intensivist) came to the ED to see this patient.  Pulse ox is better after chest tube, but pt is still hypotensive.  Will start norepinephrine drip.  He has currently receiving his third liter of NS. of pus was drained when chest tube was placed, and another in pleurivac.       30-74 min of critical care time, excluding procedures, with high imminent risk for threat to life or limb with the following systems at risk:  Shock, Septic  Activities of critical care included:  Discussion with patient/family, Review of results in the ED, Bedside evaluation and direction of care, High risk medication administration or other resuscitation, Respiratory support, Close observation and re-evaluations and Transfer to higher level of care    Critical care time was exclusive of separately billable procedures and treating other patients and teaching time.   Critical care was time spent personally by me on one or more of the following activities: discussions with consultants, development of treatment plan with patient or surrogate, evaluation of patient's response to treatment, examination of patient, obtaining history from patient or surrogate, ordering and performing treatments and interventions, ordering and review of laboratory studies, ordering and review of radiographic studies, pulse oximetry, re-evaluation of patient's condition and review of old charts.   At least one organ system is acutely impaired.   There is a high  probability of imminent, life-threatening deterioration.   I intervened to try to prevent further deterioration of the patient's condition.         Chest Tube    Date/Time: 11/28/2019 1:21 AM  Performed by: Melida Gimenez, MD  Authorized by: Inez Pilgrim, Domenica Reamer, MD     Consent:     Consent obtained:  Written  Consent given by:  Patient    Risks discussed:  Bleeding, pain, incomplete drainage and damage to surrounding structures  Pre-procedure details:     Skin preparation:  ChloraPrep    Preparation: Patient was prepped and draped in the usual sterile fashion    Anesthesia (see MAR for exact dosages):     Anesthesia method:  Local infiltration    Local anesthetic:  Lidocaine 1% w/o epi  Procedure details:     Placement location:  R lateral    Ultrasound guidance: no      Tension pneumothorax: no      Tube connected to:  Water seal    Drainage characteristics:  Purulent    Suture material:  2-0 silk    Dressing:  4x4 sterile gauze  Post-procedure details:     Post-insertion x-ray findings: tube in good position      Patient tolerance of procedure:  Tolerated well, no immediate complications  Comments:      of brown pus was drained from the R pleural cavity when the chest tube was inserted.        Clinical Impression & Disposition     Clinical Impression  Final diagnoses:   Empyema of lung   Pneumothorax on right   Severe sepsis with septic shock   Acute kidney injury   Bandemia   Lactic acidosis        ED Disposition     ED Disposition Condition Date/Time Comment    Admit  Tue Nov 28, 2019  1:21 AM Admitting Physician: Su Hoff [id number]   Service:: Medicine [106]   Estimated Length of Stay: > or = to 2 midnights   Tentative Discharge Plan?: Home or Self Care [1]   Does patient need telemetry?: Yes   Telemetry type (separate Telemetry order is also required):: Medical telemetry             Current Discharge Medication List                    Melida Gimenez, MD  11/29/19 [phone]

## 2019-11-28 ENCOUNTER — Emergency Department: Payer: No Typology Code available for payment source

## 2019-11-28 ENCOUNTER — Inpatient Hospital Stay: Payer: No Typology Code available for payment source

## 2019-11-28 DIAGNOSIS — J9601 Acute respiratory failure with hypoxia: Secondary | ICD-10-CM

## 2019-11-28 DIAGNOSIS — J869 Pyothorax without fistula: Secondary | ICD-10-CM | POA: Diagnosis present

## 2019-11-28 LAB — ECG 12-LEAD
Atrial Rate: 102 {beats}/min
P Axis: 51 degrees
P-R Interval: 128 ms
Q-T Interval: 384 ms
QRS Duration: 98 ms
QTC Calculation (Bezet): 500 ms
R Axis: 42 degrees
T Axis: -79 degrees
Ventricular Rate: 102 {beats}/min

## 2019-11-28 LAB — CBC AND DIFFERENTIAL
Absolute NRBC: 0 10*3/uL (ref 0.00–0.00)
Absolute NRBC: 0 10*3/uL (ref 0.00–0.00)
Hematocrit: 36.7 % — ABNORMAL LOW (ref 37.6–49.6)
Hematocrit: 29.8 % — ABNORMAL LOW (ref 37.6–49.6)
Hgb: 12.6 g/dL (ref 12.5–17.1)
Hgb: 10.6 g/dL — ABNORMAL LOW (ref 12.5–17.1)
MCH: 30.1 pg (ref 25.1–33.5)
MCH: 30.5 pg (ref 25.1–33.5)
MCHC: 34.3 g/dL (ref 31.5–35.8)
MCHC: 35.6 g/dL (ref 31.5–35.8)
MCV: 87.6 fL (ref 78.0–96.0)
MCV: 85.9 fL (ref 78.0–96.0)
MPV: 9.3 fL (ref 8.9–12.5)
MPV: 9.3 fL (ref 8.9–12.5)
Nucleated RBC: 0 /100 WBC (ref 0.0–0.0)
Nucleated RBC: 0 /100 WBC (ref 0.0–0.0)
Platelets: 437 10*3/uL — ABNORMAL HIGH (ref 142–346)
Platelets: 342 10*3/uL (ref 142–346)
RBC: 4.19 10*6/uL — ABNORMAL LOW (ref 4.20–5.90)
RBC: 3.47 10*6/uL — ABNORMAL LOW (ref 4.20–5.90)
RDW: 13 % (ref 11–15)
RDW: 13 % (ref 11–15)
WBC: 23.57 10*3/uL — ABNORMAL HIGH (ref 3.10–9.50)
WBC: 24.05 10*3/uL — ABNORMAL HIGH (ref 3.10–9.50)

## 2019-11-28 LAB — BLOOD GAS, VENOUS
Base Excess, Ven: -0.5 mEq/L
HCO3, Ven: 23.7 mEq/L
O2 Sat, Venous: 63.5 %
Temperature: 37
Venous Total CO2: 55.7 mEq/L
pCO2, Venous: 36.6 mmHg
pH, Ven: 7.42
pO2, Venous: 32.7 mmHg

## 2019-11-28 LAB — COVID-19 (SARS-COV-2) & INFLUENZA  A/B, NAA (ROCHE LIAT)
Influenza A: NOT DETECTED
Influenza B: NOT DETECTED
SARS CoV 2 Overall Result: NOT DETECTED

## 2019-11-28 LAB — CELL MORPHOLOGY
Cell Morphology: NORMAL
Cell Morphology: NORMAL
Platelet Estimate: NORMAL
Platelet Estimate: NORMAL

## 2019-11-28 LAB — GLUCOSE WHOLE BLOOD - POCT: Whole Blood Glucose POCT: 154 mg/dL — ABNORMAL HIGH (ref 70–100)

## 2019-11-28 LAB — LACTIC ACID, PLASMA
Lactic Acid: 3.8 mmol/L — ABNORMAL HIGH (ref 0.2–2.0)
Lactic Acid: 2.8 mmol/L — ABNORMAL HIGH (ref 0.2–2.0)
Lactic Acid: 1.7 mmol/L (ref 0.2–2.0)

## 2019-11-28 LAB — MAN DIFF ONLY
Band Neutrophils Absolute: 18.62 10*3/uL — ABNORMAL HIGH (ref 0.00–1.00)
Band Neutrophils Absolute: 16.84 10*3/uL — ABNORMAL HIGH (ref 0.00–1.00)
Band Neutrophils: 79 %
Band Neutrophils: 70 %
Basophils Absolute Manual: 0 10*3/uL (ref 0.00–0.08)
Basophils Absolute Manual: 0 10*3/uL (ref 0.00–0.08)
Basophils Manual: 0 %
Basophils Manual: 0 %
Eosinophils Absolute Manual: 0 10*3/uL (ref 0.00–0.44)
Eosinophils Absolute Manual: 0 10*3/uL (ref 0.00–0.44)
Eosinophils Manual: 0 %
Eosinophils Manual: 0 %
Lymphocytes Absolute Manual: 0.24 10*3/uL — ABNORMAL LOW (ref 0.42–3.22)
Lymphocytes Absolute Manual: 0 10*3/uL — ABNORMAL LOW (ref 0.42–3.22)
Lymphocytes Manual: 1 %
Lymphocytes Manual: 0 %
Monocytes Absolute: 0 10*3/uL — ABNORMAL LOW (ref 0.21–0.85)
Monocytes Absolute: 0.24 10*3/uL (ref 0.21–0.85)
Monocytes Manual: 0 %
Monocytes Manual: 1 %
Neutrophils Absolute Manual: 4.71 10*3/uL (ref 1.10–6.33)
Neutrophils Absolute Manual: 6.97 10*3/uL — ABNORMAL HIGH (ref 1.10–6.33)
Segmented Neutrophils: 20 %
Segmented Neutrophils: 29 %

## 2019-11-28 LAB — VANCOMYCIN, RANDOM: Vancomycin Random: 7.5 ug/mL

## 2019-11-28 LAB — URINALYSIS, REFLEX TO MICROSCOPIC EXAM IF INDICATED
Blood, UA: NEGATIVE
Glucose, UA: 50 — AB
Leukocyte Esterase, UA: NEGATIVE
Nitrite, UA: NEGATIVE
Protein, UR: 30 — AB
Specific Gravity UA: 1.028 (ref 1.001–1.035)
Urine pH: 5 (ref 5.0–8.0)
Urobilinogen, UA: 4 mg/dL — AB (ref 0.2–2.0)

## 2019-11-28 LAB — BASIC METABOLIC PANEL
Anion Gap: 14 (ref 5.0–15.0)
BUN: 46 mg/dL — ABNORMAL HIGH (ref 9–28)
CO2: 22 mEq/L (ref 22–29)
Calcium: 8.7 mg/dL (ref 7.9–10.2)
Chloride: 100 mEq/L (ref 100–111)
Creatinine: 2.1 mg/dL — ABNORMAL HIGH (ref 0.7–1.3)
Glucose: 163 mg/dL — ABNORMAL HIGH (ref 70–100)
Potassium: 3.8 mEq/L (ref 3.5–5.1)
Sodium: 136 mEq/L (ref 136–145)

## 2019-11-28 LAB — GFR: EGFR: 30.7

## 2019-11-28 LAB — MAGNESIUM: Magnesium: 2.2 mg/dL (ref 1.6–2.6)

## 2019-11-28 MED ORDER — FENTANYL CITRATE (PF) 50 MCG/ML IJ SOLN (WRAP)
25.0000 ug | INTRAMUSCULAR | Status: DC | PRN
Start: 2019-11-28 — End: 2019-11-30
  Filled 2019-11-28: qty 2

## 2019-11-28 MED ORDER — DEXTROSE 5 % IV EXCEL
0.5000 mg/min | INTRAVENOUS | Status: DC
Start: 2019-11-28 — End: 2019-11-30
  Administered 2019-11-28: 15:00:00 1 mg/min via INTRAVENOUS
  Administered 2019-11-29: 08:00:00 0.5 mg/min via INTRAVENOUS
  Administered 2019-11-30: 09:00:00 1 mg/min via INTRAVENOUS
  Filled 2019-11-28 (×2): qty 18

## 2019-11-28 MED ORDER — PANTOPRAZOLE SODIUM 40 MG IV SOLR
40.0000 mg | Freq: Every day | INTRAVENOUS | Status: DC
Start: 2019-11-28 — End: 2019-11-30
  Administered 2019-11-28 – 2019-11-30 (×3): 40 mg via INTRAVENOUS
  Filled 2019-11-28 (×3): qty 40

## 2019-11-28 MED ORDER — INSULIN LISPRO 100 UNIT/ML SC SOLN
1.0000 [IU] | Freq: Three times a day (TID) | SUBCUTANEOUS | Status: DC
Start: 2019-11-29 — End: 2019-11-29

## 2019-11-28 MED ORDER — NOREPINEPHRINE 8 MG/100 ML (SIMPLE)
0.0000 ug/min | Status: DC
Start: 2019-11-28 — End: 2019-11-30
  Administered 2019-11-28 (×2): 10 ug/min via INTRAVENOUS
  Administered 2019-11-29 – 2019-11-30 (×2): 3 ug/min via INTRAVENOUS
  Filled 2019-11-28 (×4): qty 100

## 2019-11-28 MED ORDER — VANCOMYCIN PHARMACY TO DOSE PLACEHOLDER
INTRAVENOUS | Status: DC
Start: 2019-11-28 — End: 2019-11-29

## 2019-11-28 MED ORDER — AMIODARONE HCL IN DEXTROSE 150-4.21 MG/100ML-% IV SOLN
150.0000 mg | Freq: Once | INTRAVENOUS | Status: AC
Start: 2019-11-28 — End: 2019-11-28
  Administered 2019-11-28: 14:00:00 150 mg via INTRAVENOUS

## 2019-11-28 MED ORDER — PLASMA-LYTE A IV INFUSION
INTRAVENOUS | Status: AC
Start: 2019-11-28 — End: 2019-11-28

## 2019-11-28 MED ORDER — SUCRALFATE 1 GM/10ML PO SUSP
1.0000 g | Freq: Four times a day (QID) | ORAL | Status: DC
Start: 2019-11-28 — End: 2019-11-28

## 2019-11-28 MED ORDER — SENNOSIDES-DOCUSATE SODIUM 8.6-50 MG PO TABS
1.0000 | ORAL_TABLET | Freq: Two times a day (BID) | ORAL | Status: DC
Start: 2019-11-28 — End: 2019-11-28

## 2019-11-28 MED ORDER — FLUCONAZOLE IN SODIUM CHLORIDE 400-0.9 MG/200ML-% IV SOLN
400.0000 mg | INTRAVENOUS | Status: DC
Start: 2019-11-28 — End: 2019-11-29
  Administered 2019-11-28: 12:00:00 400 mg via INTRAVENOUS
  Filled 2019-11-28: qty 200

## 2019-11-28 MED ORDER — HALOPERIDOL LACTATE 5 MG/ML IJ SOLN
2.5000 mg | Freq: Once | INTRAMUSCULAR | Status: AC
Start: 2019-11-28 — End: 2019-11-28
  Administered 2019-11-28: 05:00:00 2.5 mg via INTRAVENOUS
  Filled 2019-11-28: qty 1

## 2019-11-28 MED ORDER — GLUCOSE 40 % PO GEL
15.0000 g | ORAL | Status: DC | PRN
Start: 2019-11-28 — End: 2019-11-30

## 2019-11-28 MED ORDER — PLASMA-LYTE A IV BOLUS
1000.0000 mL | Freq: Once | INTRAVENOUS | Status: AC
Start: 2019-11-28 — End: 2019-11-28
  Administered 2019-11-28: 03:00:00 1000 mL via INTRAVENOUS

## 2019-11-28 MED ORDER — GLUCAGON 1 MG IJ SOLR (WRAP)
1.0000 mg | INTRAMUSCULAR | Status: DC | PRN
Start: 2019-11-28 — End: 2019-11-30

## 2019-11-28 MED ORDER — VANCOMYCIN 1000 MG IN 250 ML NS IVPB VIAL-MATE (CNR)
1000.0000 mg | Freq: Once | INTRAVENOUS | Status: AC
Start: 2019-11-28 — End: 2019-11-29
  Administered 2019-11-29: 1000 mg via INTRAVENOUS
  Filled 2019-11-28: qty 250

## 2019-11-28 MED ORDER — HEPARIN SODIUM (PORCINE) 5000 UNIT/ML IJ SOLN
5000.0000 [IU] | Freq: Three times a day (TID) | INTRAMUSCULAR | Status: DC
Start: 2019-11-28 — End: 2019-11-30
  Administered 2019-11-28 – 2019-11-30 (×6): 5000 [IU] via SUBCUTANEOUS
  Filled 2019-11-28 (×6): qty 1

## 2019-11-28 MED ORDER — DEXTROSE 50 % IV SOLN
12.5000 g | INTRAVENOUS | Status: DC | PRN
Start: 2019-11-28 — End: 2019-11-30

## 2019-11-28 MED ORDER — SODIUM CHLORIDE 0.9 % IV MBP
2.2500 g | Freq: Four times a day (QID) | INTRAVENOUS | Status: DC
Start: 2019-11-28 — End: 2019-11-30
  Administered 2019-11-28 – 2019-11-30 (×9): 2.25 g via INTRAVENOUS
  Filled 2019-11-28 (×10): qty 2.25

## 2019-11-28 MED ORDER — ALBUMIN HUMAN/BIOSIMILIAR 5% IV SOLN (WRAP)
25.0000 g | Freq: Once | INTRAVENOUS | Status: AC
Start: 2019-11-28 — End: 2019-11-28
  Administered 2019-11-28: 12:00:00 25 g via INTRAVENOUS
  Filled 2019-11-28: qty 500

## 2019-11-28 MED ORDER — VANCOMYCIN 1000 MG IN 250 ML NS IVPB VIAL-MATE (CNR)
1000.0000 mg | INTRAVENOUS | Status: DC
Start: 2019-11-29 — End: 2019-11-28

## 2019-11-28 MED ORDER — DEXTROSE 5 % IV SOLN
150.0000 mg | Freq: Once | INTRAVENOUS | Status: DC
Start: 2019-11-28 — End: 2019-11-28

## 2019-11-28 MED ORDER — INSULIN LISPRO 100 UNIT/ML SC SOLN
1.0000 [IU] | Freq: Every evening | SUBCUTANEOUS | Status: DC
Start: 2019-11-28 — End: 2019-11-29

## 2019-11-28 MED ORDER — SODIUM CHLORIDE 0.9 % IV BOLUS
1000.0000 mL | Freq: Once | INTRAVENOUS | Status: AC
Start: 2019-11-28 — End: 2019-11-28
  Administered 2019-11-28: 1000 mL via INTRAVENOUS

## 2019-11-28 MED ORDER — STERILE WATER FOR INJECTION IJ SOLN
4.5000 g | Freq: Three times a day (TID) | INTRAVENOUS | Status: DC
Start: 2019-11-28 — End: 2019-11-28

## 2019-11-28 NOTE — H&P (Signed)
[facility] Bab[facility]- Medical Critical Care Service [facility])      ADMISSION- HISTORY AND PHYSICAL EXAM      Date Time: 11/28/19 12:07 AM  Patient Name: [name] [name]  Attending Physician: Melida Gimenez, MD  Primary Care Physician: [name], MD  Location/Room: 04/A04     Chief Complaint / Primary reason for ICU evaluation:  Septic shock, lactic acidosis     History of Presenting Illness:   [name] [name] is a 79 y.o. male with a PMHx of Parkinson's, DMII, CAD s/p CABG 2006, PAD s/p femoral bypass. Pt presented to [facility] 11/27/19 with a 2 week history of progressive cough, fatigue, decreased appetite. On presentation to the ED he was hypotensive, his workup was pertinent for a leukocytosis WBC 23, bandemia, Lactic acidosis of 9.6, AKI BUN/Cr 47/2.5. Chest XR identified a right hydropneumothorax, multifocal pneumonia. A chest tube was placed and 500 cc of purulent fluid was removed at that time. Pt is being admitted to the ICU for medical management     Problem List:   Problem List:   #Empyemea   #Pneumonia  #Septic Shock  #Acute Respiratory failure  #AKI    Patient Active Problem List   Diagnosis   . Parkinson's disease   . Diabetes mellitus type 2, controlled   . Mild episode of recurrent major depressive disorder   . Neutrophilic leukocytosis   . Low back pain   . Hypertension   . Hepatic cyst   . Dyslipidemia   . Gouty arthropathy   . Diffuse esophageal spasm   . Depression   . CAD (coronary artery disease)   . Anosmia   . Benign essential hypertension   . Angina pectoris   . Nocturia   . Optic neuritis   . Other hyperlipidemia   . Pulmonary nodule, left   . S/P CABG (coronary artery bypass graft)   . Shoulder joint pain   . Tremor   . Ureteric stone   . Urge incontinence   . Chest pain   . Normocytic anemia   . Esophageal dysphagia       Plan:   Dispo:    Critical Care services by organ system  NEURO:   #Parkinson Disease  - SLP for dysphagia   - Continue Carbidopa-Levodopa  -  Analgesia: Rectal tylenol for mild pain, Dilaudid 0.2 mg for severe pain     #GOC  - The pt stated to the ED physician and the son that he would not want CPR or to be intubated and placed on a ventilator   - DNR/DNI support OK     CV: #Septic Shock #Hemodynamics (Heart Rate: 87) (BP: 90/48)  - Blood pressure goals: MAP greater than 65, SBP < 160  - s/p 3L of IVF in the ED but pt remains hypotensive  - Start Levophed gtt to meet MAP goal  - Trend Lactic Q4 hr   - Hold home Losartan,       PULM: #Hypoxic respiratory failure #Empyemia   - Place chest tube to -20 mmHg continuous suction   - Start HFNC if unable to wean off Non-rebreather   - Continue to wean FiO2 as tolerated   - HOB > 30 degrees and aggressive pulmonary toileting.   - Bronchodilators as needed to remedy bronchospasm.    GI: #Dysphagia   - SLP  - Goal 1 BM per day   - Senna/docusate to reach this goal   - PRN Suppository if no  BM > 48 hours   - GI PPX: Pepcid   - Nutrition: NPO       RENAL:   Lab Results   Component Value Date    CREAT 2.5 (H) 11/27/2019    BUN 47 (H) 11/27/2019    NA 134 (L) 11/27/2019    K 3.3 (L) 11/27/2019    CL 93 (L) 11/27/2019    CO2 16 (L) 11/27/2019        #AKI   - Baseline Cr. 1.0  - Renally dose all medications and avoid nephrotoxins.   - Electrolyte repletion per protocol.  #Hypovolemia   - Continue ultrasound guided fluid resuscitation    - -s/p 3L of IVF in the ED      ID: #Septic Shock #Pneumonia  - Follow cultures of pleural fluid  - Start Vancomycin and Zosyn for broad spectrum coverage      HEME:   SCD's  Pharmacologic ppx: Heparin SQ    ENDO/RHEUM:   Glucose: goal 100 -180    Code Status: Prior  Patient is currently able to make medical decisions.      Patients designated proxy is Son and was present.    Discussed current diagnoses, prognosis and goals of care.     Past Medical History:     Past Medical History:   Diagnosis Date   . 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 Diabetes mellitus type 2, controlled     . Diabetic nephropathy associated with type 2 diabetes mellitus    . Diabetic nephropathy associated with type 2 diabetes mellitus    . Hepatic cyst    . Hyperlipidemia    . Hypertension 30 years ago   . Normocytic anemia    . Parkinsons    . Stented coronary artery        Past Surgical History:     Past Surgical History:   Procedure Laterality Date   . CHOLECYSTECTOMY  1990s   . CORONARY ARTERY BYPASS GRAFT  1990s   . FEMORAL BYPASS  1994       Family History:     Family History   Problem Relation Age of Onset   . Hypertension Mother    . Hypertension Father        Social History:     Social History     Socioeconomic History   . Marital status: Unknown     Spouse name: Not on file   . Number of children: Not on file   . Years of education: Not on file   . Highest education level: Not on file   Occupational History   . Not on file   Tobacco Use   . Smoking status: Never Smoker   . Smokeless tobacco: Never Used   Vaping Use   . Vaping Use: Never used   Substance and Sexual Activity   . Alcohol use: Yes     Alcohol/week: 2.0 standard drinks     Types: 2 Cans of beer per week   . Drug use: Never   . Sexual activity: Not on file   Other Topics Concern   . Not on file   Social History Narrative   . Not on file     Social Determinants of Health     Financial Resource Strain:    . Difficulty of Paying Living Expenses: Not on file   Food Insecurity:    . Worried About Programme researcher, broadcasting/film/video in the Last Year:  Not on file   . Ran Out of Food in the Last Year: Not on file   Transportation Needs:    . Lack of Transportation (Medical): Not on file   . Lack of Transportation (Non-Medical): Not on file   Physical Activity:    . Days of Exercise per Week: Not on file   . Minutes of Exercise per Session: Not on file   Stress:    . Feeling of Stress : Not on file   Social Connections:    . Frequency of Communication with Friends and Family: Not on file   . Frequency of Social Gatherings with Friends and Family: Not on file   . Attends  Religious Services: Not on file   . Active Member of Clubs or Organizations: Not on file   . Attends Banker Meetings: Not on file   . Marital Status: Not on file   Intimate Partner Violence:    . Fear of Current or Ex-Partner: Not on file   . Emotionally Abused: Not on file   . Physically Abused: Not on file   . Sexually Abused: Not on file   Housing Stability:    . Unable to Pay for Housing in the Last Year: Not on file   . Number of Places Lived in the Last Year: Not on file   . Unstable Housing in the Last Year: Not on file       Allergies:   No Known Allergies    Medications:   Hospital medications:    Current Facility-Administered Medications   Medication Dose Route Frequency   . sodium chloride  1,000 mL Intravenous Once   . sodium chloride  1,000 mL Intravenous Once   . TdaP Booster  0.5 mL Intramuscular Once   . vancomycin  1,000 mg Intravenous Once        Current Facility-Administered Medications   Medication       Review of Systems:   Due to critical status of the patient at this time, ROS was not obtained.    Physical Exam:   Temp:  [97.5 F (36.4 C)] 97.5 F (36.4 C)  Heart Rate:  [87-110] 87  Resp Rate:  [20-34] 31  BP: (61-95)/(40-63) 90/48    Intake and Output Summary (Last 24 hours) at Date Time  No intake or output data in the 24 hours ending 11/28/19 0007    Vent Settings:       BP 90/48   Pulse 87   Temp 97.5 F (36.4 C) (Oral)   Resp (!) 31   SpO2 (!) 83%      General Appearance: Elderly adult male, sitting up in the stretcher, not in distress  Mental status:  anxious, Answers questions and follows commands   Neuro:  Moving all four extremities, no focal deficit   Lungs: Normal work of breathing, no use of accessory muscles, absent lung sounds in the RLL, no wheezing   Cardiac: Sinus tachycardia, occasional PVC  Abdomen:  soft, nontender, nondistended, no masses or organomegaly  Extremities: peripheral pulses normal, no pedal edema, no clubbing or cyanosis   Skin:   Multiple  abrasions at the knees from falling   Other:      Labs:     Results     Procedure Component Value Units Date/Time    Manual Differential [[id number]]  (Abnormal) Collected: 11/27/19 2311     Updated: 11/28/19 0006     Segmented Neutrophils 20 %  Band Neutrophils 79 %      Lymphocytes Manual 1 %      Monocytes Manual 0 %      Eosinophils Manual 0 %      Basophils Manual 0 %      Neutrophils Absolute Manual 4.71 x10 3/uL      Band Neutrophils Absolute 18.62 x10 3/uL      Lymphocytes Absolute Manual 0.24 x10 3/uL      Monocytes Absolute 0.00 x10 3/uL      Eosinophils Absolute Manual 0.00 x10 3/uL      Basophils Absolute Manual 0.00 x10 3/uL     Cell MorpHology [[id number]]  (Abnormal) Collected: 11/27/19 2311     Updated: 11/28/19 0006     Cell Morphology Normal     Platelet Estimate Normal     Platelet Clumps Present    CBC and differential [[id number]]  (Abnormal) Collected: 11/27/19 2311    Specimen: Blood Updated: 11/28/19 0005     WBC 23.57 x10 3/uL      Hgb 12.6 g/dL      Hematocrit 25.9 %      Platelets 437 x10 3/uL      RBC 4.19 x10 6/uL      MCV 87.6 fL      MCH 30.1 pg      MCHC 34.3 g/dL      RDW 13 %      MPV 9.3 fL      Nucleated RBC 0.0 /100 WBC      Absolute NRBC 0.00 x10 3/uL     Blood Culture Aerobic/Anaerobic #1 [[id number]] Collected: 11/27/19 2311    Specimen: Arm from Blood, Venipuncture Updated: 11/27/19 2347    Narrative:      1 BLUE+1 PURPLE    Lactic Acid [[id number]]  (Abnormal) Collected: 11/27/19 2311    Specimen: Blood Updated: 11/27/19 2341     Lactic Acid 9.6 mmol/L     Narrative:      Cancel second order for specimen if the initial level is less  than 2.4mEq/L    GFR [[id number]] Collected: 11/27/19 2311     Updated: 11/27/19 2335     EGFR 25.1    Comprehensive metabolic panel [[id number]]  (Abnormal) Collected: 11/27/19 2311    Specimen: Blood Updated: 11/27/19 2335     Glucose 190 mg/dL      BUN 47 mg/dL      Creatinine 2.5 mg/dL      Sodium 322 mEq/L      Potassium 3.3 mEq/L       Chloride 93 mEq/L      CO2 16 mEq/L      Calcium 10.1 mg/dL      Protein, Total 6.0 g/dL      Albumin 2.6 g/dL      AST (SGOT) 16 U/L      ALT <6 U/L      Alkaline Phosphatase 80 U/L      Bilirubin, Total 1.1 mg/dL      Globulin 3.4 g/dL      Albumin/Globulin Ratio 0.8     Anion Gap 25.0    Blood Culture Aerobic/Anaerobic #2 [[id number]] Collected: 11/27/19 2311    Specimen: Arm from Blood, Venipuncture Updated: 11/27/19 2312    Narrative:      1 BLUE+1 PURPLE          Microbiology Results (last 15 days)     Procedure Component Value Units Date/Time    Blood Culture Aerobic/Anaerobic #1 [[id number]] Collected: 11/27/19  2311    Order Status: Sent Specimen: Arm from Blood, Venipuncture Updated: 11/27/19 2347    Narrative:      1 BLUE+1 PURPLE    Blood Culture Aerobic/Anaerobic #2 [[id number]] Collected: 11/27/19 2311    Order Status: Sent Specimen: Arm from Blood, Venipuncture Updated: 11/27/19 2312    Narrative:      1 BLUE+1 PURPLE    Urine culture [[id number]]     Order Status: Sent Specimen: Urine, Clean Catch           Radiology / Imaging:     Chest AP Portable   Final Result         Moderate right hydropneumothorax.      Patchy consolidation in the right mid to lower lung and left lower lobe   likely reflects multifocal pneumonia.      These critical results were discussed with Dr. Melida Gimenez at   11/27/2019 11:33 PM.      Theador Hawthorne, MD    11/27/2019 11:34 PM          I have personally reviewed the patient's history and 24 hour interval events, along with vitals, labs, radiology images, and additional findings found in detail within ICU team notes from house staff, NPPs and nursing, with their care plans developed and reviewed by me. So far today, I have spent 63 minutes providing critical care for this patient excluding teaching and billable procedures, not overlapping with over providers.   Plan discussed with EICU    Signed by: Doristine Section, PA  11/28/2019 12:07 AM  [id number], Pryor Ochoa, MD  For admissions 7a-7p:  [id number]  [facility] attending: [phone]  [facility] attending: [phone]

## 2019-11-28 NOTE — Progress Notes (Signed)
[facility] ICU Multidisciplinary Rounds   11/28/2019  10:29 AM    Team Members: [x]  MD: ________ [x]  Charge RN [x]  RN  [x]  Pharmacist [x]  Dietician []  NP/PA []  CM/SW []  Clinical Director []  RT []  Palliative Care        Daily IP ICU Checklist:   Central Line: []  CVC [x]  CVC day: _1__  CVC location: [x]  IJ []  SC []  Femoral []  other: ___   []  D/C CVC []  Change line   [x]  CVC indication:  vasopressors______  IV Infusions:  Current Facility-Administered Medications   Medication   . norepinephrine (LEVOPHED) infusion   . Plasma-Lyte A     . norepinephrine (LEVOPHED) infusion 4 mcg/min (11/28/19 0950)   . Plasma-Lyte A 50 mL/hr at 11/28/19 0900     VAP Bundle:   Respiratory Status: []  RA [x]  medium Nasal Cannula []  BiPAP []  HFNC []  Ventilator  Settings:__6L______________  []  HOB elevated 30 degrees  []  Oral Care   []  Plan for SAT/SBT today  GI: Ulcer Prophylaxis: []  Not indicated []  H2 blocker  [x]  PPI    []  Tube feeds []  Tolerating Feeds []  TPN [x]  NPO []  Diet:____  CAUTI Prevention: []  indwelling foley catheter [x]  External cath    Foley Catheter day: _____  []  Foley indication: _______    []  Foley Secured   []  MD order in   Integumentary/VTE Prophylaxis: []  SCD  Chemical PPX: []  SQ lovenox [x]  SQ heparin  []  On hold:___   []  Wounds/WOCN Consult       Other:    []  Other Consults:_______  []  Family/Social:___ []  Family Meeting date: ___  []  PT/OT needed  []  Speech Therapy  [x]  Restraint orders  []  Glucose Controlled   []  Insulin Protocol: []  DKA []  Adult ICU Protocol []    [x]  Labs and Radiologic Studies Reviewed  []  Plan for transfer/discharge?:______     Notes:    fluconizal IV  Protonix IV  CT head and chest  Palliative care consult  Vancomycin level 2100  D/c lactic Q 4 hrs, tetanus, carafate, pericolace,  albumin   fentanyl prn

## 2019-11-28 NOTE — Plan of Care (Addendum)
Problem: Compromised Hemodynamic Status  Goal: Vital signs and fluid balance maintained/improved  Outcome: Progressing  Flowsheets (Taken 11/28/2019 0247)  Vital signs and fluid balance are maintained/improved:  Marland Kitchen Position patient for maximum circulation/cardiac output  . Monitor/assess vitals and hemodynamic parameters with position changes  . Monitor and compare daily weight  . Monitor/assess lab values and report abnormal values     Problem: Inadequate Gas Exchange  Goal: Adequate oxygenation and improved ventilation  Outcome: Progressing  Flowsheets (Taken 11/28/2019 0247)  Adequate oxygenation and improved ventilation:  . Assess lung sounds  . Monitor SpO2 and treat as needed  . Position for maximum ventilatory efficiency  . Plan activities to conserve energy: plan rest periods  . Increase activity as tolerated/progressive mobility  . Consult/collaborate with Respiratory Therapy     Problem: Impaired Mobility  Goal: Mobility/Activity is maintained at optimal level for patient  Outcome: Progressing  Flowsheets (Taken 11/28/2019 0247)  Mobility/activity is maintained at optimal level for patient:  . Increase mobility as tolerated/progressive mobility  . Encourage independent activity per ability  . Maintain proper body alignment  . Perform active/passive ROM  . Reposition patient every 2 hours and as needed unless able to reposition self  . Plan activities to conserve energy, plan rest periods   Shift Note:    Pt is admitted from ED.     Orientation: AOx 4   Rhythm on tele: ST   Oxygen: 10L med flow   Ambulation: Reposition Q2H   Lines/Drips: Levophed gtt   GI/GU: condom cath     Comments:   - chest tube -20cmHg     Plan:   - Lactic Q4H  - IV zosyn, vanc     Skin Assessment  Two nurse skin assessment performed with: Synetta Fail RN     Areas observed with redness or injury:    Lower back skin tear covered with mepilex   Multiple small abrasions BLE covered with mepilex   Blanchable redness sacrum - mepilex     Braden  score <15. Yes

## 2019-11-28 NOTE — ED Notes (Signed)
SEPSIS COMMUNICATION NOTE    ED SEPSIS INITIATION TIME:  2304    SEVERE SEPSIS CRITERIA   1. 2 or more SIRS (Temperature >100.4 or < 96.8 F, HR >90 BPM, Resp Rate > 20 breaths/min, WBC >12,000 or < 4,000 per uL or bands > 10%): (Y/N)Yes  2. Infection Known or Suspected (urine, cellulitis, pneumonia, unknown, etc.): (Y/N)Yes  3. End Organ Dysfunction one of the following: Lactate > 2.0, INR > 1.5, PLT < 100k, Bili > 2.0, Cr > 2.0, Intubation/C/BiPAP, or SBP < 90):(Y/N)Yes  Platelets   Date/Time Value Ref Range Status   11/27/2019 11:11 PM 437 (H) 142 - 346 x10 3/uL Final     Creatinine   Date/Time Value Ref Range Status   11/27/2019 11:11 PM 2.5 (H) 0.7 - 1.3 mg/dL Final   08/65/7846 96:29 AM 1.06 0.76 - 1.27 mg/dL Final     Bilirubin, Total   Date/Time Value Ref Range Status   11/27/2019 11:11 PM 1.1 0.2 - 1.2 mg/dL Final     Lactic Acid   Date/Time Value Ref Range Status   11/27/2019 11:11 PM 9.6 (HH) 0.2 - 2.0 mmol/L Final     Comment:     Results confirmed, test repeated.  Critical lactic acid results called to [id number].  Readback confirmed by [id number] at 11/27/2019 23:39          Severe Sepsis Time Zero is when YES to all three components within 6 hours.  = What time were all three present (SEVERE SEPSIS):  2311    REQUIRED SEVERE SEPSIS BUNDLE  Lactic Acid #1: (Y or N) Yes   Blood cultures (before starting antibiotics)(x 2): (Y or N) Yes  Antibiotics started(within 3hr of Severe Sepsis time zero): (Y or N)  Yes  Lactic Acid #2 :   Drawn (Y/N) Yes - pending     If not drawn, communicated to, Receiving Nurse and Time Due Sent around 0105    (admit nurse must obtain new lactic order, if again greater than 2, reorder in 6 hours)  IV Fluid 23ml/kg  30x69.6 kg  Bolus - required SEPTIC SHOCK (hypotension or lactate >or =4) (Y/N/NA and amount): (Y/N and amount) Yes  Medications   tetanus-diphth-acell pertussis (BOOSTRIX) injection 0.5 mL (has no administration in time range)   sodium chloride 0.9 % bolus 1,000 mL (1,000 mLs  Intravenous New Bag 11/28/19 0036)   norepinephrine (LEVOPHED) 8 mg in dextrose 5% 100 mL infusion (0 mcg/min Intravenous Paused 11/28/19 0059)   heparin (porcine) injection 5,000 Units (has no administration in time range)   sucralfate (CARAFATE) 1 GM/10ML oral suspension 1 g (has no administration in time range)   senna-docusate (PERICOLACE) 8.6-50 MG per tablet 1 tablet (has no administration in time range)   sodium chloride 0.9 % bolus 1,000 mL (0 mLs Intravenous Stopped 11/28/19 0037)   sodium chloride 0.9 % bolus 1,000 mL (0 mLs Intravenous Stopped 11/28/19 0037)   vancomycin (VANCOCIN) 1000 mg in NS 250 mL IVPB vial-mate (0 mg Intravenous Stopped 11/28/19 0104)   piperacillin-tazobactam (ZOSYN) 4.5 g in sterile water (preservative free) 20 mL IV push injection (4.5 g Intravenous Given 11/27/19 2332)        SEPTIC SHOCK (6 hour clock) Monitor for hypotension during ED stay  (Severe sepsis + hypotension refractory to 19ml/kg IVF (2 SBPs in a row <90 or MAP<65 in the hour following completion of IVF Bolus) or Lactate ? 4.0).     Continued hypotension after bolus:  Vasopressors (Y/N/NA  and time started): Started @ 0055  (Initiate norepinephrine 2-42mcg/min IV and titrate to a target MAP ? 65, say yes to is this first order for sepsis)    Or Initial Lactic was 4 or more:  Huddle with provider about repeat volume status and tissue perfusion assessment (found in vasopressor order or Smart[employer] .SEPSISCMS)  Provider must document after fluid bolus has begun

## 2019-11-28 NOTE — UM Notes (Signed)
This clinical review is based on/compiled from documentation provided by the treatment team within the patient's medical record.    Raymond Gurney, RN, BSN  Clinical Case Manager  [facility]    [address]    [place], [place] [address]  NPI: [id number]  Tax ID: [id number]  Phone: [phone]  Fax: [phone]    Please use fax number [phone] to provide authorization for hospital services or to request additional information.           PATIENT NAME: [name] [name], [name] [name]  DOB: 16-Aug-1940  PMH:   Diagnosis   . Coronary artery disease   . Diabetes mellitus type 2, controlled   . Diabetic nephropathy associated with type 2 diabetes mellitus   . Diabetic nephropathy associated with type 2 diabetes mellitus   . Hepatic cyst   . Hyperlipidemia   . Hypertension   . Normocytic anemia   . Parkinsons   . Stented coronary artery     ADMITTED ON: 11/27/2019 [name] [name] is a 79 y.o. male who is brought in by his son because he has had a cough for the last 2 weeks which seems to be getting worse.  He was confused last night and slept most of today.  He has fallen 3 times today resulting in numerous scrapes to his legs.  He has not had any fever, vomiting, diarrhea according to the son      ED Triage Vitals   Enc Vitals Group      BP 11/27/19 2304 (!) 61/40      Heart Rate 11/27/19 2304 (!) 110      Resp Rate 11/27/19 2304 20      Temp 11/27/19 2304 97.5 F (36.4 C)      Temp Source 11/27/19 2258 Oral      SpO2 11/27/19 2304 (!) 84 %      Weight 11/28/19 0043 69.6 kg (153 lb 7 oz)      Height 11/28/19 0146 1.803 m (5\' 11" )       ADMISSION DIAGNOSIS:     11/28/19 0110  ADMIT TO INPATIENT  Once        Diagnosis: Empyema Of Lung    Level of Care: ICU    Patient Class: Inpatient       References:[facility] Bed Placement CriteriaIFMC Bed Placement CriteriaIFOH Bed Placement CriteriaILH Bed Placement CriteriaIMVH Bed Placement Criteria   Question Answer Comment   Admitting Physician  ARMENTA Omar Person N    Service: Medicine    Estimated Length of Stay 5 - 7 Days    Tentative Discharge Plan? Home-Health Care Svc    Does patient need telemetry? Yes    Telemetry type (separate Telemetry order is also required): Medical telemetry              11/27/19    Chest x-ray    Moderate right hydropneumothorax.    Patchy consolidation in the right mid to lower lung and left lower lobe  likely reflects multifocal pneumonia.      11/28/2019 Patient in ICU, on 6 liters of oxygen    Vitals:    11/28/19 0915 11/28/19 0930 11/28/19 0945 11/28/19 1000   BP: 98/49 96/50 145/62 93/53   Pulse: (!) 112 (!) 112 (!) 118 (!) 111   Resp: (!) 25 (!) 24 (!) 32 (!) 37   Temp:       TempSrc:       SpO2: 100% 100% 100% 99%  Weight:       Height:           NOTES:     [name] [name] is a 79 y.o. male with a PMHx of Parkinson's, DMII, CAD s/p CABG 2006, PAD s/p femoral bypass. Pt [facility] to [facility]/27/19 with a 2 week history of progressive cough, fatigue, decreased appetite. On presentation to the ED he was hypotensive, his workup was pertinent for a leukocytosis WBC 23, bandemia, Lactic acidosis of 9.6, AKI BUN/Cr 47/2.5. Chest XR identified a right hydropneumothorax, multifocal pneumonia. A chest tube was placed and 500 cc of purulent fluid was removed at that time. Pt is being admitted to the ICU for medical management     Critical Care services by organ system  NEURO:   #Parkinson Disease  - SLP for dysphagia   - Continue Carbidopa-Levodopa  - Analgesia: Rectal tylenol for mild pain, Dilaudid 0.2 mg for severe pain     #GOC  - The pt stated to the ED physician and the son that he would not want CPR or to be intubated and placed on a ventilator   - DNR/DNI support OK     CV: #Septic Shock #Hemodynamics (Heart Rate: 87) (BP: 90/48)  - Blood pressure goals: MAP greater than 65, SBP < 160  - s/p 3L of IVF in the ED but pt remains hypotensive  - Start Levophed gtt to meet MAP goal  - Trend Lactic Q4 hr   - Hold  home Losartan,       PULM: #Hypoxic respiratory failure #Empyemia   - Place chest tube to -20 mmHg continuous suction   - Start HFNC if unable to wean off Non-rebreather   - Continue to wean FiO2 as tolerated   - HOB >30 degrees and aggressive pulmonary toileting.   -Bronchodilators as needed to remedybronchospasm.    GI: #Dysphagia   - SLP  - Goal 1 BM per day   - Senna/docusate to reach this goal   - PRN Suppository if no BM > 48 hours   - GI PPX: Pepcid   - Nutrition: NPO     LABS:      11/28/2019 05:02   WBC 24.05 (H)      11/28/2019 05:02   BUN 46 (H)   Creatinine 2.1 (H)      11/27/2019 23:11   Lactic Acid 9.6 (HH)        11/28/2019 06:24   Color, UA Amber (A)   Clarity, UA Clear   Specific Gravity, UA 1.028   Urine pH 5.0   Leukocyte Esterase, UA Negative   Nitrite, UA Negative   Protein, UR 30 (A)   Glucose, UA 50 (A)   Ketones UA Trace (A)   Urobilinogen, UA 4.0 (A)   Bilirubin, UA Small (A)     MEDS:     Current Facility-Administered Medications   Medication Dose Route Frequency   . heparin (porcine)  5,000 Units Subcutaneous Q8H SCH   . piperacillin-tazobactam  2.25 g Intravenous Q6H   . senna-docusate  1 tablet Oral Q12H SCH   . sucralfate  1 g per NG tube TID AC & HS   . TdaP Booster  0.5 mL Intramuscular Once   . vancomycin   Intravenous See Admin Instructions     . norepinephrine (LEVOPHED) infusion 4 mcg/min (11/28/19 0950)   . Plasma-Lyte A 50 mL/hr at 11/28/19 0900       IMAGING:  Chest x-ray  1. Interval  placement of a right-sided chest tube with slight interval  decrease in size of a small right-sided pneumothorax.  2. Persistent patchy consolidative airspace opacities within the right  mid to lower lung field.  3. Mild left basilar atelectasis.

## 2019-11-28 NOTE — Progress Notes (Signed)
Critical Care Daily Progress Note       Active Hospital Problems    Diagnosis   . Empyema of lung        Impression and Plan:  1. Septic shock / Right chest empyema - Adequate volume resuscitation overnight. Started on levophed (currently at 2 mcg/min). No indication for stress dose steroids. Non invasive BP monitoring for now.  2. RLL pneumonia - Possible necrotizing pneumonia from aspiration vs esophageal distension and perforation to right pleural space w/ resulting abscess / empyema. 14 Fr right pigtail inserted in ER. Culture w/ GPR and budding yeast. Continue vanc / zosyn / fluconazole. ID consulted.  3. Esophageal dysmotility - Seen by GI as outpatient (Dr Dorthy Cooler). Planned to do EGD. Continue PPI. Keep NPO. GI consult.  4. CAD - S/P 4vCABG in 2006. Resume medical management when able.  5. Parkinson's disease - Resume medical management when able. Currently NPO.  6. Code Status - do not resuscitate    Medications:  Current Facility-Administered Medications   Medication Dose Route Frequency   . fluconazole  400 mg Intravenous Q24H   . heparin (porcine)  5,000 Units Subcutaneous Q8H SCH   . pantoprazole  40 mg Intravenous Daily   . piperacillin-tazobactam  2.25 g Intravenous Q6H   . vancomycin   Intravenous See Admin Instructions       Infusions:  . norepinephrine (LEVOPHED) infusion 4 mcg/min (11/28/19 1300)   . Plasma-Lyte A 50 mL/hr at 11/28/19 1300       Past 24 hour events significant for: Admitted to ICU last night for septic shock. Pt is awake and alert, follows commands. On low dose levophed and oxygenating well on 2 l/min NC. Denies pain or dyspnea. Patient has parkison's disease.       Physical Examination:  BP 100/62   Pulse (!) 120   Temp 98.6 F (37 C) (Axillary)   Resp (!) 35   Ht 1.803 m (5\' 11" )   Wt 74.7 kg (164 lb 10.9 oz)   SpO2 99%   BMI 22.97 kg/m       NECK: Supple; No JVD  LUNGS: Absent breath sounds to right base. Otherwise clear to auscultation, no wheezing.  HEART: RRR no  MGR  ABD: NABS; SNT; active bowel sounds  EXT: No Edema; Peripheral pulses intact; Cap refill normal  NEURO: Awake and alert; follows commands; Grossly non-focal  LINES: RIJ TLC  PROPHY: UFH, PPI    Labs and x-rays reviewed    Critical care time excluding procedures = 50 min    Greater than 50% spent in counseling and coordination of care regarding current medical management and future plan of care

## 2019-11-28 NOTE — Consults (Signed)
CONSULTATION    Date Time: 11/28/19 1:36 PM  Patient Name: [name] [name], [name] [name]  Requesting Physician: Inez Pilgrim, Domenica Reamer,*      Reason for Consultation:   Empyema    Assessment:   79 year old male with multiple medical problems, Parkinson's disease with known esophageal issues/dysphagia undergoing work-up, presenting here with septic shock due to right-sided empyema and pneumonia.  Based on CT findings likely has aspiration with resultant necrotizing pneumonia and empyema versus potential esophageal perforation.  I agree with current broad-spectrum coverage pending final culture results from pleural fluid, but appears to have oral/GI tract flora involved.  He may need larger chest tube drainage or potential evaluation by thoracic surgery.     Plan:   1.  Continue with Zosyn 2.25 g IV every 6 hours, vancomycin dosed by levels; redose when level less than 15; and fluconazole 400 mg IV daily.   2.  Follow-up pleural fluid, blood cultures.  Monitor WBC trend.  3.  Supportive care as per ICU.  4.  GI/Surgery evaluation for potential esophageal perforation.     Discussed with Dr. Eston Mould and patient's family at bedside.   Thank you for involving [place] in the care of this patient. We will follow with you.    [place] ([id number])  [facility]  [phone]    History:   [name] [name] is a 79 y.o. male with history of diabetes, CAD status post CABG, hypertension, and Parkinson's disease, who presents to the hospital on 11/27/2019 with cough, fatigue, and falls.  History is obtained with the assistance of his grandson.  States he was being evaluated for GI due to issues with esophageal dysmotility.  He had not yet undergone EGD.  He had several falls recently with trauma to his knees.  He was increasingly fatigued with significant coughing and so was brought to the ED.  Here he was found to be hypotensive with marked leukocytosis and bandemia, lactic acidosis, acute renal failure,  and chest x-ray identified right hydropneumothorax with multifocal pneumonia.  A chest tube was placed in the ED with drainage of purulent fluid; Gram stain is positive for GPR and yeast.  He was started on empiric Zosyn, vancomycin, and fluconazole.  Currently remains on low-dose Levophed.  He is afebrile.  At this time he denies any pain in the chest, vomiting, diarrhea, or shortness of breath.    Past Medical History:     Past Medical History:   Diagnosis Date   . 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 Diabetes mellitus type 2, controlled    . Diabetic nephropathy associated with type 2 diabetes mellitus    . Diabetic nephropathy associated with type 2 diabetes mellitus    . Hepatic cyst    . Hyperlipidemia    . Hypertension 30 years ago   . Normocytic anemia    . Parkinsons    . Stented coronary artery        Past Surgical History:     Past Surgical History:   Procedure Laterality Date   . CHOLECYSTECTOMY  1990s   . CORONARY ARTERY BYPASS GRAFT  1990s   . FEMORAL BYPASS  1994       Family History:     Family History   Problem Relation Age of Onset   . Hypertension Mother    . Hypertension Father        Social History:     Social History  Socioeconomic History   . Marital status: Unknown     Spouse name: Not on file   . Number of children: Not on file   . Years of education: Not on file   . Highest education level: Not on file   Occupational History   . Not on file   Tobacco Use   . Smoking status: Never Smoker   . Smokeless tobacco: Never Used   Vaping Use   . Vaping Use: Never used   Substance and Sexual Activity   . Alcohol use: Yes     Alcohol/week: 2.0 standard drinks     Types: 2 Cans of beer per week   . Drug use: Never   . Sexual activity: Not on file   Other Topics Concern   . Not on file   Social History Narrative   . Not on file     Social Determinants of Health     Financial Resource Strain:    . Difficulty of Paying Living Expenses: Not on file   Food Insecurity:    . Worried About  Programme researcher, broadcasting/film/video in the Last Year: Not on file   . Ran Out of Food in the Last Year: Not on file   Transportation Needs:    . Lack of Transportation (Medical): Not on file   . Lack of Transportation (Non-Medical): Not on file   Physical Activity:    . Days of Exercise per Week: Not on file   . Minutes of Exercise per Session: Not on file   Stress:    . Feeling of Stress : Not on file   Social Connections:    . Frequency of Communication with Friends and Family: Not on file   . Frequency of Social Gatherings with Friends and Family: Not on file   . Attends Religious Services: Not on file   . Active Member of Clubs or Organizations: Not on file   . Attends Banker Meetings: Not on file   . Marital Status: Not on file   Intimate Partner Violence:    . Fear of Current or Ex-Partner: Not on file   . Emotionally Abused: Not on file   . Physically Abused: Not on file   . Sexually Abused: Not on file   Housing Stability:    . Unable to Pay for Housing in the Last Year: Not on file   . Number of Places Lived in the Last Year: Not on file   . Unstable Housing in the Last Year: Not on file       Allergies:   No Known Allergies    Medications:     Current Discharge Medication List      CONTINUE these medications which have NOT CHANGED    Details   allopurinol (ZYLOPRIM) 300 MG tablet TAKE 1 TABLET BY MOUTH EVERY DAY  Qty: 30 tablet, Refills: 0    Associated Diagnoses: Gout, unspecified cause, unspecified chronicity, unspecified site      atorvastatin (LIPITOR) 20 MG tablet Take 2 tablets (40 mg total) by mouth nightly  Qty: 60 tablet, Refills: 0      carbidopa-levodopa (Rytary) 36.25-145 MG Cap CR per capsule Take 3 capsules by mouth every 4 (four) hours At 0800, 1200, 1600, 2000, 0000      losartan (COZAAR) 25 MG tablet TAKE 1 TABLET BY MOUTH EVERY DAY  Qty: 90 tablet, Refills: 4    Associated Diagnoses: Essential hypertension      oxybutynin (DITROPAN-XL)  10 MG 24 hr tablet Take 1 tablet (10 mg total) by mouth  daily  Qty: 30 tablet, Refills: 6      pantoprazole (PROTONIX) 40 MG tablet Take 1 tablet (40 mg total) by mouth every morning before breakfast  Qty: 30 tablet, Refills: 0      sertraline (ZOLOFT) 25 MG tablet TAKE 1 TABLET BY MOUTH EVERY DAY  Qty: 90 tablet, Refills: 1    Associated Diagnoses: Type 2 diabetes mellitus without complication, without long-term current use of insulin      sucralfate (CARAFATE) 1 g tablet Take 1 tablet (1 g total) by mouth 4 (four) times daily  Qty: 40 tablet, Refills: 0      Ascorbic Acid (VITAMIN C PO) Take 1,000 mg by mouth      aspirin EC 81 MG EC tablet Take 81 mg by mouth daily         Coenzyme Q10 (CoQ10) 200 MG Cap Take 400 mg by mouth      glucosamine-chondroitin 500-400 MG tablet Take 1 tablet by mouth      nitroglycerin (NITROSTAT) 0.4 MG SL tablet Place 1 tablet (0.4 mg total) under the tongue every 5 (five) minutes as needed for Chest pain  Qty: 25 tablet, Refills: 2    Associated Diagnoses: Chest pain, unspecified type            Current Facility-Administered Medications   Medication Dose Route Frequency   . fluconazole  400 mg Intravenous Q24H   . heparin (porcine)  5,000 Units Subcutaneous Q8H SCH   . pantoprazole  40 mg Intravenous Daily   . piperacillin-tazobactam  2.25 g Intravenous Q6H   . vancomycin   Intravenous See Admin Instructions       Review of Systems:   A comprehensive review of systems was: negative except as noted in the HPI.    Physical Exam:     Vitals:    11/28/19 1315   BP: 100/62   Pulse: (!) 120   Resp: (!) 35   Temp:    SpO2: 99%     General appearance - awake, alert, ill-appearing elderly male, in no acute distress  Mental status - alert and cooperative  Eyes - anicteric sclera  Mouth - dry oral mucosa  Neck - supple, no adenopathy  Chest - diminished on the right with rhonchi; chest tube with purulent fluid output  Heart - tachycardic, regular  Abdomen - soft, nontender, nondistended, active bowel sounds  Musculoskeletal - no joint swelling or  tenderness  Extremities - no edema  Skin - no rash; abrasions on knees/legs    Lines:   Peripheral IV    Labs Reviewed:     Results     Procedure Component Value Units Date/Time    Urine culture [[id number]] Collected: 11/28/19 0624    Specimen: Urine, Clean Catch Updated: 11/28/19 1129    Narrative:      Indications for Urine Culture:->Other (please specify in  Comments)    Lactic Acid [[id number]] Collected: 11/28/19 0915    Specimen: Blood Updated: 11/28/19 0939     Lactic Acid 1.7 mmol/L     UA, Reflex to Microscopic [[id number]]  (Abnormal) Collected: 11/28/19 0624    Specimen: Urine Updated: 11/28/19 0932     Urine Type Clean Catch     Color, UA Amber     Clarity, UA Clear     Specific Gravity UA 1.028     Urine pH 5.0     Leukocyte Esterase,  UA Negative     Nitrite, UA Negative     Protein, UR 30     Glucose, UA 50     Ketones UA Trace     Urobilinogen, UA 4.0 mg/dL      Bilirubin, UA Small     Blood, UA Negative     RBC, UA 0 - 2 /hpf      WBC, UA 0 - 5 /hpf      Squamous Epithelial Cells, Urine 0 - 5 /hpf      Hyaline Casts, UA 0 - 3 /lpf     Culture + Gram Stain,Aerobic, Body Fluid [[id number]] Collected: 11/28/19 0044    Specimen: Body Fluid from Pleural Fluid Updated: 11/28/19 0834    Narrative:      ORDER#: [id number]                                    ORDERED BY: Melida Gimenez  SOURCE: Pleural Fluid Right chest                    COLLECTED:  11/28/19 00:44  ANTIBIOTICS AT COLL.:                                RECEIVED :  11/28/19 05:40  Stain, Gram                                FINAL       11/28/19 08:34  11/28/19   Many WBCs             Many Gram positive rods             Moderate Budding yeast             Stain performed on Cytospin (concentrated) specimen             Gram Stain reviewed and verified by Microbiology at [place]  Culture and Gram Stain, Aerobic, Body FluidPENDING      Basic Metabolic Panel [[id number]]  (Abnormal) Collected: 11/28/19 0502    Specimen: Blood Updated: 11/28/19 0540     Glucose  163 mg/dL      BUN 46 mg/dL      Creatinine 2.1 mg/dL      Calcium 8.7 mg/dL      Sodium 962 mEq/L      Potassium 3.8 mEq/L      Chloride 100 mEq/L      CO2 22 mEq/L      Anion Gap 14.0    Narrative:      [place]uled by [id number] at 11/28/2019 04:36 Reason: rn DRAW     Magnesium [[id number]] Collected: 11/28/19 0502    Specimen: Blood Updated: 11/28/19 0540     Magnesium 2.2 mg/dL     Narrative:      Rescheduled by [id number] at 11/28/2019 04:36 Reason: rn DRAW     GFR [[id number]] Collected: 11/28/19 0502     Updated: 11/28/19 0540     EGFR 30.7    Narrative:      Rescheduled by [id number] at 11/28/2019 04:36 Reason: rn DRAW     Manual Differential [[id number]]  (Abnormal) Collected: 11/28/19 0502     Updated: 11/28/19 0535     Segmented Neutrophils 29 %      Band Neutrophils  70 %      Lymphocytes Manual 0 %      Monocytes Manual 1 %      Eosinophils Manual 0 %      Basophils Manual 0 %      Neutrophils Absolute Manual 6.97 x10 3/uL      Band Neutrophils Absolute 16.84 x10 3/uL      Lymphocytes Absolute Manual 0.00 x10 3/uL      Monocytes Absolute 0.24 x10 3/uL      Eosinophils Absolute Manual 0.00 x10 3/uL      Basophils Absolute Manual 0.00 x10 3/uL     Narrative:      Rescheduled by [id number] at 11/28/2019 04:36 Reason: rn DRAW     Cell MorpHology [[id number]] Collected: 11/28/19 0502     Updated: 11/28/19 0535     Cell Morphology Normal     Platelet Estimate Normal    Narrative:      Rescheduled by [id number] at 11/28/2019 04:36 Reason: rn DRAW     CBC and differential [[id number]]  (Abnormal) Collected: 11/28/19 0502    Specimen: Blood Updated: 11/28/19 0535     WBC 24.05 x10 3/uL      Hgb 10.6 g/dL      Hematocrit 62.1 %      Platelets 342 x10 3/uL      RBC 3.47 x10 6/uL      MCV 85.9 fL      MCH 30.5 pg      MCHC 35.6 g/dL      RDW 13 %      MPV 9.3 fL      Nucleated RBC 0.0 /100 WBC      Absolute NRBC 0.00 x10 3/uL     Narrative:      Rescheduled by [id number] at 11/28/2019 04:36 Reason: rn DRAW     MRSA culture - Nares [[id number]]  Collected: 11/28/19 0044    Specimen: Culturette from Nares Updated: 11/28/19 0529    MRSA culture - Throat [[id number]] Collected: 11/28/19 0044    Specimen: Culturette from Throat Updated: 11/28/19 0529    Lactic Acid [[id number]]  (Abnormal) Collected: 11/28/19 0502    Specimen: Blood Updated: 11/28/19 0526     Lactic Acid 2.8 mmol/L     Narrative:      Rescheduled by [id number] at 11/28/2019 04:36 Reason: rn DRAW     Blood Culture Aerobic/Anaerobic #2 [[id number]] Collected: 11/27/19 2311    Specimen: Arm from Blood, Venipuncture Updated: 11/28/19 0524    Narrative:      R AC  1 BLUE+1 PURPLE    Blood gas, venous [[id number]] Collected: 11/28/19 0502    Specimen: Blood Updated: 11/28/19 0513     pH, Ven 7.42     pCO2, Venous 36.6 mmHg      pO2, Venous 32.7 mmHg      HCO3, Ven 23.7 mEq/L      Venous Total CO2 55.7 mEq/L      Base Excess, Ven -0.5 mEq/L      O2 Sat, Venous 63.5 %      Temperature 37.0     VBG CollectionSite Venous    COVID-19 (SARS-CoV-2) and Influenza A/B, NAA ([facility] Rapid) [[id number]] Collected: 11/28/19 0044    Specimen: Culturette from Nasopharyngeal Updated: 11/28/19 0138     Purpose of COVID testing Diagnostic -PUI     SARS-CoV-2 Specimen Source Nasopharyngeal     SARS CoV 2 Overall Result Not Detected     Influenza A Not Detected  Influenza B Not Detected    Narrative:      o Collect and clearly label specimen type:  o PREFERRED-Upper respiratory specimen: One Nasopharyngeal  Swab in Transport Media.  o Hand deliver to laboratory ASAP  Diagnostic -PUI    Preliminary Gram Stain [[id number]] Collected: 11/28/19 0044    Specimen: Pleural Fluid Updated: 11/28/19 0137    Narrative:      ORDER#: [id number]                                    ORDERED BY: ARMENTA CORONA,  SOURCE: Pleural Fluid right chest                    COLLECTED:  11/28/19 00:44  ANTIBIOTICS AT COLL.:                                RECEIVED :  11/28/19 01:34  Preliminary Gram Stain                     FINAL       11/28/19  01:37  11/28/19   Many WBCs             Many Gram positive rods             Few Budding yeast               Stain performed on Cytospin (concentrated) specimen             For final Gram Stain report, see order #[id number]             This is a preliminary result. The gram stain will be reviewed             by the Microbiology Department at the [facility].             See final result under Stain, Gram.      Lactic Acid [[id number]]  (Abnormal) Collected: 11/28/19 0104    Specimen: Blood Updated: 11/28/19 0134     Lactic Acid 3.8 mmol/L     Manual Differential [[id number]]  (Abnormal) Collected: 11/27/19 2311     Updated: 11/28/19 0006     Segmented Neutrophils 20 %      Band Neutrophils 79 %      Lymphocytes Manual 1 %      Monocytes Manual 0 %      Eosinophils Manual 0 %      Basophils Manual 0 %      Neutrophils Absolute Manual 4.71 x10 3/uL      Band Neutrophils Absolute 18.62 x10 3/uL      Lymphocytes Absolute Manual 0.24 x10 3/uL      Monocytes Absolute 0.00 x10 3/uL      Eosinophils Absolute Manual 0.00 x10 3/uL      Basophils Absolute Manual 0.00 x10 3/uL     Cell MorpHology [[id number]]  (Abnormal) Collected: 11/27/19 2311     Updated: 11/28/19 0006     Cell Morphology Normal     Platelet Estimate Normal     Platelet Clumps Present    CBC and differential [[id number]]  (Abnormal) Collected: 11/27/19 2311    Specimen: Blood Updated: 11/28/19 0005     WBC 23.57 x10 3/uL      Hgb 12.6 g/dL      Hematocrit  36.7 %      Platelets 437 x10 3/uL      RBC 4.19 x10 6/uL      MCV 87.6 fL      MCH 30.1 pg      MCHC 34.3 g/dL      RDW 13 %      MPV 9.3 fL      Nucleated RBC 0.0 /100 WBC      Absolute NRBC 0.00 x10 3/uL     Blood Culture Aerobic/Anaerobic #1 [[id number]] Collected: 11/27/19 2311    Specimen: Arm from Blood, Venipuncture Updated: 11/27/19 2347    Narrative:      1 BLUE+1 PURPLE    Lactic Acid [[id number]]  (Abnormal) Collected: 11/27/19 2311    Specimen: Blood Updated: 11/27/19 2341     Lactic Acid 9.6  mmol/L     Narrative:      Cancel second order for specimen if the initial level is less  than 2.22mEq/L    GFR [[id number]] Collected: 11/27/19 2311     Updated: 11/27/19 2335     EGFR 25.1    Comprehensive metabolic panel [[id number]]  (Abnormal) Collected: 11/27/19 2311    Specimen: Blood Updated: 11/27/19 2335     Glucose 190 mg/dL      BUN 47 mg/dL      Creatinine 2.5 mg/dL      Sodium 629 mEq/L      Potassium 3.3 mEq/L      Chloride 93 mEq/L      CO2 16 mEq/L      Calcium 10.1 mg/dL      Protein, Total 6.0 g/dL      Albumin 2.6 g/dL      AST (SGOT) 16 U/L      ALT <6 U/L      Alkaline Phosphatase 80 U/L      Bilirubin, Total 1.1 mg/dL      Globulin 3.4 g/dL      Albumin/Globulin Ratio 0.8     Anion Gap 25.0          Rads:   Radiological Procedure reviewed.   CT Head WO Contrast    Result Date: 11/28/2019   1. No CT evidence of acute intracranial abnormality. 2. Cerebral volume loss, intracranial atherosclerosis and mild sequela of chronic small vessel ischemic disease. Waynard Edwards, MD  11/28/2019 11:37 AM    CT Chest WO Contrast    Result Date: 11/28/2019  1. Extensive loculated mottled air and tissue density in the medial right lower chest, of similar appearance and potentially contiguous to that within a massively distended esophagus. Differential diagnosis includes necrotizing pneumonia from aspiration, vs esophageal perforation with mediastinitis/abscess. 2. A right pleural tube is in place, currently with adjacent trace right pleural effusion and minimal right pneumothorax. 3. Consolidation involves both lower lobes. 4. Postoperative changes from previous coronary bypass. 5. Urgent findings discussed with and acknowledged by Dr. Eston Mould at 1:30 PM. Wilmon Pali, MD  11/28/2019 1:31 PM    FL Swallow Cine/Video Contrast Study W Speech Therapy    Result Date: 11/02/2019  Fluoroscopic guidance was provided for oropharyngeal swallowing study. Wilmon Pali, MD  11/02/2019 11:41 AM    XR Chest AP Portable    Result  Date: 11/28/2019  1. Stable right-sided chest tube with small residual right-sided pneumothorax. 2. Interval placement of a right central venous catheter with its distal tip terminating over the SVC. 3. Persistent patchy airspace opacities within the right mid to lower lung field. 4. Mild  left basilar atelectasis. Judd Gaudier, MD  11/28/2019 3:01 AM    Chest AP Portable    Result Date: 11/28/2019  1. Interval placement of a right-sided chest tube with slight interval decrease in size of a small right-sided pneumothorax. 2. Persistent patchy consolidative airspace opacities within the right mid to lower lung field. 3. Mild left basilar atelectasis. Judd Gaudier, MD  11/28/2019 1:18 AM    Chest AP Portable    Result Date: 11/27/2019  Moderate right hydropneumothorax. Patchy consolidation in the right mid to lower lung and left lower lobe likely reflects multifocal pneumonia. These critical results were discussed with Dr. Melida Gimenez at 11/27/2019 11:33 PM. Theador Hawthorne, MD  11/27/2019 11:34 PM    Chest AP Portable    Result Date: 11/07/2019   No interval change. Postoperative changes anterior chest and heart with clear lungs. Remainder as above. Wilmon Pali, MD  11/07/2019 1:01 PM    Chest AP Portable    Result Date: 11/01/2019   No evidence of acute disease. Stephannie Peters, MD  11/01/2019 11:55 AM       Signed by: Christene Slates, MD

## 2019-11-28 NOTE — Progress Notes (Addendum)
Case Management Initial Assessment and Discharge Planning:      Situation JAZION, ATTEBERRY MILLER,79 y.o.,male  HospitalDay:0  Admitting DX: Empyema of lung [J86.9]  Pneumothorax on right [J93.9]    Lace Score:9   Background This write met with son at bedside in ICU to discuss discharge planning needs. Pt lives with his son.He was independent with ADLS . Pt sustained falls x3 at home.   Denies h/o home care services.   DME: rollator  Home Health: none  Transportation: son  Out Patient: [facility], receives PT ST services     Assessment EX:BMWU presented to [facility] 11/27/19 with a 2 week history of progressive cough, fatigue, decreased appetite. CC: in  ED he was hypotensive, his workup was pertinent for a leukocytosis WBC 23, bandemia, Lactic acidosis of 9.6, AKI BUN/Cr 47/2.5. Chest XR identified a right hydropneumothorax, multifocal pneumonia. A chest tube was placed and 500 cc of purulent fluid was removed at that time.   PMH: Parkinson's, DM, CAD, s/p CABG 2006, PAD fem bypass    Discharge Barriers identified upon initial assessment: TBD   Recommendation   Possible post-hospitalization need based on patient's GOC and treatment plan:   .           CM to follow for home care service.s  . +Palliative care screen, consult done today.      CM will continue to monitor patient's progression of care, d/c needs and possible barriers to discharge.      Dewaine Conger, RN BSN MPA CCM, ACM-RN  Clinical Case Manager II  [facility]  Case Management Department  Phone: [phone]  Fax:     [phone]    11/28/19 1747   Patient Type   Within 30 Days of Previous Admission? No   Healthcare Decisions   Interviewed: Family   Name of interviewee if other than the pt: Son- Cy Blamer   Interviewee Contact Information: # [phone]   Orientation/Decision Making Abilities of Patient Alert and Oriented x3, able to make decisions   Advance Directive Patient has advance directive,  copy not in chart   Advance Directive not in Chart Copy requested from family/decision maker   Healthcare Agent Appointed Yes   (RETIRED) Healthcare Agent's Name son Kayd Launer   (RETIRED) Healthcare Agent's Phone Number [phone]   Additional Emergency Contacts? na   Prior to admission   Prior level of function Ambulates with assistive device   Type of Residence Private residence   Home Layout Multi-level   Have running water, electricity, heat, etc? Yes   Living Arrangements Children   How do you get to your MD appointments? son   How do you get your groceries? son   Who fixes your meals? son   Who does your laundry? son   Who picks up your prescriptions? son   Dressing Independent   Grooming Independent   Feeding Independent   Bathing Independent   Toileting Independent   DME Currently at Home Other (Comment)  (rollator)   Name of Prior Assisted Living Facility na   Home Care/Community Services Other (Comment)  (Outpt Parkinsons Program at [facility])   Name of Agency [facility] program   Frequency of services weekly   Prior SNF admission? (Detail) na   Prior Rehab admission? (Detail) na   Discharge Planning   Support Systems Children   Patient expects to be discharged to: tbd   Anticipated  [place] plan discussed with: Same as interviewed   Potential barriers to discharge: Decreased mobility   Mode of transportation: Private car (family member)   Does the patient have perscription coverage? Yes   Consults/Providers   PT Evaluation Needed 1   OT Evalulation Needed 1   SLP Evaluation Needed 1   Outcome Palliative Care Screen Screened, met criteria for intervention   Current State of Palliative Care Palliative care consult initiated or already involved   Correct PCP listed in Epic? Yes   Important Message from [facility] Notice   Patient received 1st IMM Letter? No

## 2019-11-28 NOTE — Plan of Care (Signed)
Problem: Non-Violent Restraints Interdisciplinary Plan  Goal: Will be injury free during the use of non-violent restraints  Outcome: Progressing  Flowsheets (Taken 11/28/2019 0457)  Will be injury free during the use of non-violent restraints:   Attempt all alternatives before use of restraints   Initiate least restrictive type of restraint that is effective   Provide and maintain safe environment   Notify family of initiation of restraints   Include patient/family/caregiver in decisions related to safety   Ensure safety devices are properly applied and maintained   Nurse to accompany patient off unit when on restraints   Document observed patient actions according to protocol   Remove restraints before the indicated maximum length of time when meets criteria for discontinuation   Document significant changes in patient condition   Provide debriefing as soon as possible and appropriate   Reassess need for continued restraints   Ensure that order for restraints has not expired     Pt confused oriented x2. Not following commands. Pt removed IV himself. Reaching out to chest tube and central line. Mittens order placed.

## 2019-11-28 NOTE — SLP Eval Note (Signed)
Order received for bedside swallow eval. Pt known to our dept from previous admission. Spoke with Dr Eston Mould who would like to hold off on any PO at this time. Will check back later.      Blondell Reveal, MS, CCC-SLP    [place] [facility] # [phone]

## 2019-11-28 NOTE — Procedures (Signed)
[name] [name] is a 79 y.o. male patient.  Principal Problem:    Empyema of lung    Past Medical History:   Diagnosis Date   . 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 Diabetes mellitus type 2, controlled    . Diabetic nephropathy associated with type 2 diabetes mellitus    . Diabetic nephropathy associated with type 2 diabetes mellitus    . Hepatic cyst    . Hyperlipidemia    . Hypertension 30 years ago   . Normocytic anemia    . Parkinsons    . Stented coronary artery      Blood pressure 123/60, pulse (!) 109, temperature 97.3 F (36.3 C), temperature source Axillary, resp. rate (!) 31, height 1.803 m (5\' 11" ), weight 74.7 kg (164 lb 10.9 oz), SpO2 98 %.    Central Line    Date/Time: 11/28/2019 2:36 AM  Performed by: Doristine Section, PA  Authorized by: Doristine Section, PA   Consent: Verbal consent obtained. Written consent obtained.  Consent given by: power of attorney  Patient understanding: patient states understanding of the procedure being performed  Patient identity confirmed: arm band, provided demographic data and hospital-assigned identification number  Time out: Immediately prior to procedure a "time out" was called to verify the correct patient, procedure, equipment, support staff and site/side marked as required.  Indications: vascular access  Anesthesia: local infiltration    Anesthesia:  Local Anesthetic: lidocaine 1% with epinephrine  Anesthetic total: 3 mL    Sedation:  Patient sedated: no    Preparation: skin prepped with 2% chlorhexidine  Skin prep agent dried: skin prep agent completely dried prior to procedure  Sterile barriers: all five maximum sterile barriers used - cap, mask, sterile gown, sterile gloves, and large sterile sheet  Hand hygiene: hand hygiene performed prior to central venous catheter insertion  Location details: right internal jugular  Patient position: Trendelenburg  Catheter type: triple lumen  Catheter size: 7 Fr  Pre-procedure: landmarks identified  Ultrasound  guidance: yes  Sterile ultrasound techniques: sterile gel and sterile probe covers were used  Number of attempts: 1  Successful placement: yes  Post-procedure: line sutured and dressing applied  Assessment: blood return through all ports and free fluid flow  Patient tolerance: patient tolerated the procedure well with no immediate complications          Doristine Section, PA  11/28/2019

## 2019-11-28 NOTE — Consults (Signed)
[facility]  CONSULTATION NOTE  [facility] call: [phone]  [facility] call: [phone]  [facility] call: [phone]  After hours call [phone]        Date Time: 11/28/19 4:54 PM  Patient Name: [name] [name], [name] [name]  Requesting Physician: Inez Pilgrim, Domenica Reamer,*       Reason for Consultation:   Dysphagia   RLL pneumonia   Right chest empyema     Assessment and Plan:   Assessment:  79 y.o. male with history of Parkinson's Disease, Type 2 Diabetes, HLD, PAD s/p femoral bypass, CAD s/p CABG 2006, HTN, who presents to the hospital on 11/27/2019 with progressively worsening cough, fatigue, shortness of breath, decreased appetite. Septic shock related to right hydropneumothorax, multifocal pneumonia, possibly GI origin given progressively worsening dysphagia and massively distended esophagus on imaging.     1. Right chest empyema, septic shock - currently on levophed     2. RLL pneumonia - possible necrotizing pneumonia from aspiration vs esophageal perforation with abscess formation. S/p chest tube placement with 500 cc purulent fluid removed.     3. Dysphagia - seen outpatient by Dr. Dorthy Cooler and EGD planned. Progressively worsening solid and liquid dysphagia over the last year. DDX includes malignancy, stricture, achalasia, dysmotility.     4. CAD s/p CABG 2006     Plan:  1.  EGD once hemodynamically stable, possibly tomorrow if stable off pressors. The upper endoscopy procedure was explained to the patient, including indication, alternatives, possible benefits, and potential risks (including but not limited to bleeding, infection, perforation, aspiration, anesthesia complications such as slowed breathing and lowered blood pressure). He appeared to understand and agrees to proceed.  2.  Keep NPO    History:   [name] [name] is a 78 y.o. male with history of Parkinson's Disease, Type 2 Diabetes, HLD, PAD s/p femoral bypass, CAD s/p CABG 2006, HTN, who presents to the hospital on  11/27/2019 with progressively worsening cough, fatigue, shortness of breath, decreased appetite. Presented with WBC 23, AKI with Cr 2.5 and CXR showing right hydropneumothorax, multifocal pneumonia. S/p chest tube placement with 500 cc purulent fluid removed. Admitted to ICU with septic shock.     CT without contrast shows extensive loculated mottled air and tissue density in medial right lower chest, similar appearance and potentially contiguous to that within a massively distended esophagus. DDX includes necrotizing pneumonia from aspiration vs esophageal perforation with mediastinitis/abscess.     Son bedside and reports progressively worsening dysphagia to solids and liquids for the last year. He had chest pain with recent negative cardiac workup, planning EGD with Dr. Dorthy Cooler.     VFSS with Speech 11/01/19 showed mild oropharyngeal dysphagia, collaboration with radiology, noted to have esophageal retention/stasis with retrograde flow below upper esophageal sphincter seen across all trials, contrast material slow to empty but near full esophageal clearance achieved after several minutes of rest.     Past Medical History:     Past Medical History:   Diagnosis Date   . 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 Diabetes mellitus type 2, controlled    . Diabetic nephropathy associated with type 2 diabetes mellitus    . Diabetic nephropathy associated with type 2 diabetes mellitus    . Hepatic cyst    . Hyperlipidemia    . Hypertension 30 years ago   . Normocytic anemia    . Parkinsons    . Stented coronary artery        Past Surgical History:  Past Surgical History:   Procedure Laterality Date   . CHOLECYSTECTOMY  1990s   . CORONARY ARTERY BYPASS GRAFT  1990s   . FEMORAL BYPASS  1994       Family History:     Family History   Problem Relation Age of Onset   . Hypertension Mother    . Hypertension Father        Social History:     Social History     Socioeconomic History   . Marital status: Unknown      Spouse name: Not on file   . Number of children: Not on file   . Years of education: Not on file   . Highest education level: Not on file   Occupational History   . Not on file   Tobacco Use   . Smoking status: Never Smoker   . Smokeless tobacco: Never Used   Vaping Use   . Vaping Use: Never used   Substance and Sexual Activity   . Alcohol use: Yes     Alcohol/week: 2.0 standard drinks     Types: 2 Cans of beer per week   . Drug use: Never   . Sexual activity: Not on file   Other Topics Concern   . Not on file   Social History Narrative   . Not on file     Social Determinants of Health     Financial Resource Strain:    . Difficulty of Paying Living Expenses: Not on file   Food Insecurity:    . Worried About Programme researcher, broadcasting/film/video in the Last Year: Not on file   . Ran Out of Food in the Last Year: Not on file   Transportation Needs:    . Lack of Transportation (Medical): Not on file   . Lack of Transportation (Non-Medical): Not on file   Physical Activity:    . Days of Exercise per Week: Not on file   . Minutes of Exercise per Session: Not on file   Stress:    . Feeling of Stress : Not on file   Social Connections:    . Frequency of Communication with Friends and Family: Not on file   . Frequency of Social Gatherings with Friends and Family: Not on file   . Attends Religious Services: Not on file   . Active Member of Clubs or Organizations: Not on file   . Attends Banker Meetings: Not on file   . Marital Status: Not on file   Intimate Partner Violence:    . Fear of Current or Ex-Partner: Not on file   . Emotionally Abused: Not on file   . Physically Abused: Not on file   . Sexually Abused: Not on file   Housing Stability:    . Unable to Pay for Housing in the Last Year: Not on file   . Number of Places Lived in the Last Year: Not on file   . Unstable Housing in the Last Year: Not on file       Allergies:   No Known Allergies    Medications:     Current Facility-Administered Medications   Medication Dose  Route Frequency   . fluconazole  400 mg Intravenous Q24H   . heparin (porcine)  5,000 Units Subcutaneous Q8H SCH   . pantoprazole  40 mg Intravenous Daily   . piperacillin-tazobactam  2.25 g Intravenous Q6H   . vancomycin   Intravenous See Admin Instructions  Review of Systems:   General:  Patient denies night sweats, fever.   HEENT:  Patient denies headache, hoarseness   Cardiovascular:  Patient denies swelling of hands/feet, fainting/blacking out, chest pain.   Respiratory:  Patient denies wheezing.   Genitourinary:  Patient denies blood in urine, dark urine  Musculoskeletal: Patient denies joint pain, joint stiffness, joint swelling.   Skin:  Patient denies itching, rash.   Neurologic:  Patient denies dizziness, loss of consciousness, fainting, confusion  Heme/Lymphatic:  Patient denies easy bruising.       Pertinent positives noted in HPI.    Physical Exam:     Vitals:    11/28/19 1600   BP:    Pulse:    Resp:    Temp: 98.7 F (37.1 C)   SpO2:        General appearance: Well developed, well nourished, appears stated age. Ill appearing.   Eyes: Sclera anicteric, pink conjunctivae, no ptosis  ENMT: mucous membranes moist, nose and ears appear normal.  Oropharynx clear.  Chest: tachypnea, no clubbing or cyanosis  CV:  Tachycardia, regular rhythm, no JVD, no LE edema  Abdomen: soft, non-tender, non-distended, no masses or organomegaly  Skin: Normal color and turgor, no rashes, no suspicious skin lesions noted  Neuro: CN II-XII grossly intact.  No gross movement disorders noted.  Mental status: Appropriate affect, alert and oriented x 3    Labs Reviewed:     Recent Labs     11/28/19  0502 11/27/19  2311   WBC 24.05* 23.57*   Hgb 10.6* 12.6   Hematocrit 29.8* 36.7*   Platelets 342 437*   MCV 85.9 87.6       Recent Labs     11/28/19  0502 11/27/19  2311   Sodium 136 134*   Potassium 3.8 3.3*   Chloride 100 93*   CO2 22 16*   BUN 46* 47*   Creatinine 2.1* 2.5*   Glucose 163* 190*   Calcium 8.7 10.1   Magnesium 2.2   --        Recent Labs     11/27/19  2311   AST (SGOT) 16   ALT <6   Alkaline Phosphatase 80   Bilirubin, Total 1.1   Protein, Total 6.0   Albumin 2.6*       No results for input(s): PTT, PT, INR in the last 72 hours.     Radiology:   Radiological Procedure reviewed:    CT chest without contrast 11/28/19: 1. Extensive loculated mottled air and tissue density in the medial  right lower chest, of similar appearance and potentially contiguous to  that within a massively distended esophagus. Differential diagnosis  includes necrotizing pneumonia from aspiration, vs esophageal  perforation with mediastinitis/abscess.  2. A right pleural tube is in place, currently with adjacent trace right  pleural effusion and minimal right pneumothorax.  3. Consolidation involves both lower lobes.  4. Postoperative changes from previous coronary bypass.  5. Urgent findings discussed with and acknowledged by Dr. Eston Mould at  1:30 PM.

## 2019-11-28 NOTE — Consults (Signed)
Available in House: 9a - 6p  [phone]     Date Time: 11/28/19 12:16 PM   Patient Name: [name] [name], [name] [name]   Location: [id number]   Attending Physician: Inez Pilgrim, Domenica Reamer,*   Primary Care Physician: [name], MD   Consulting Physician: Frederik Pear, NP   Consulting Service: Palliative Medicine  Consulted request from [facility], Domenica Reamer,* to see patient regarding:   Reason for Referral: Clarify goals of care; Advance care planning     Palliative Diagnosis Category: Sepsis/infectious disease and Neurology (CVA/Dementia...)     Assessment & Plan   Marios Gaiser [name] 79 y.o. male with past medical hx of Parkinson's disease, diabetes, major depressive disorder, low back pain, hepatic cyst, dyslipidemia, gouty arthropathy, diffuse esophageal spasm, CAD, and anosmia, benign essential hypertension, nocturia, optic neuritis, hyperlipidemia, pulmonary nodule, left, shoulder joint pain, tremors, ureter ache stone, chest pain, normocytic anemia, and esophageal dysphagia. Pt admitted s/p fall in home setting, as well as c/o cough for last 2 weeks.  Patient son reported him falling three times resulting in numerous scrapes to his legs.Pt admitted with septic shock. Pt  Chest AP Portable of 11/1 showed :Moderate right hydropneumothorax. Patchy consolidation in the right mid to lower lung and left lower lobe likely reflects multifocal pneumonia. Pt had placement of a right-sided chest tube with slight interval decrease in size of a small right-sided pneumothorax. Repeat xray showed persistent patchy consolidative airspace opacities within the right mid to lower lung and Mild left basilar atelectasis.     Palliative care consulted for goals of care and guidance with complex medical decision-making.    Patient seen in ICU on rounds today with Dr. Eston Mould and team.Pt with septic shock/ right chest empyema. He appears quietly comfortable with gentle mitts on hands, some mild tremors noted.  No acute dyspnea, pain or agitation noted.  He has CT scan of chest pending, and will continue goals of care discussion with son, as noted below.     Estimated Prognosis: Unclear   Recommendations   1. Goals of Care: See goals of care section below    2. Pain: No acute pain at this time, agree with as needed fentanyl as ordered by ICU team, Fentanyl 25 mcg Q 1HR PRN pain    3. Non-Pain Symptoms:    Dyspnea: Continue to monitor, patient at risk for increased dyspnea   Bowel Habits: With history of constipation continue to monitor, consider additional bowel regimen such as Peri-Colace or MiraLAX for patient with Parkinson's.   Appetite/Nutrition: Recommend nutritional consult   Nausea/Vomiting: Monitor for nausea and vomiting no acute concerns at this time   Mood/Agitation: Monitor for increased confusion/agitation   Delirium: Monitor as pt will be at risk for hospital delirium   Insomnia: Melatonin 3 mg PO at bedtime PRN  4. Psychosocial: Offer support and encouragement   5. Spiritual: No religion on file, son reports [employer]  6. PC Team follow-up plans: in the next few days      Discharge Disposition: TBD      Outpatient Follow Up Recommended: Yes    Outcomes: Family meetings, Clarified goals of care, Provided psychosocial or spiritual support and ACP counseling assistance  65 minutes. Total time today in care of patient including chart review, evaluation, management, counseling & coordination of care with Patient, Family, referring provider, Bedside Nurse, Charge Nurse and Case manager with recommendations above >50% of time on floor.     Frederik Pear, NP  [phone]  Palliative Medicine  [phone] 9a-6p     History of Presenting Illness   [name] [name] is a 79 y.o. male admitted to hospital on 11/27/2019 with Empyema of lung [J86.9]  Pneumothorax on right [J93.9]      Goals of Care   Goals of care: Extensive time spent with patient son Hari discussing role of palliative care in assisting  with symptom management and establishing goals of care. Discussed guidance with medical decision making, diagnosis, past medical history and current prognosis with treatment options.  Son at this time states that he has been caring for her father and home setting after her mother passed away 3 years ago.  He is hopeful that he will have some continued recovery and is waiting to understand further medical status from CT scan and other diagnostics being done.    Discussed Advance care directives and at this time son Theodoro Grist states that he has nothing formal in writing but has had some general discussions with father.  He states he knows that he would not want CPR or intubation, and would be grateful for assistance in completing advanced directive when father's condition stabilizes and he is less fatigued.  Briefly discussed progressive nature of Parkinson's and dementia with options of palliative care in outpatient setting and hospice services.  Son at this time is hoping again for continued recovery as he states his father was fairly independent prior to this admission.  Written information for advance care directives given to patient son and will continue to follow at next visit for completion if desired.    Outcome of Discussion: treatment/curative pathway    CURRENT CPR Status: NO CPR - SUPPORT OK     ACP Validation: Advance care planning discussion initialized  ACP Document: None    Medical Decision Maker: Next of Kin status: son: [name] [name] information:  TARVARIS, PUGLIA   [phone] Son         Advanced Care Planning: yes  Decisional Capacity: yes  Advance Directives have been completed in the past: no  Advance Directives are available in chart: no  Discussed today: yes  ACP note completed: no          Palliative Functional and Symptom Assessment      ADLs prior to admission:    Independent Needs assistance Dependent   Ambulation [x]  []  []    Transferring [x]  []  []    Dressing [x]  []  []    Bathing [x]  []  []     Toileting [x]  []  []    Feeding [x]  []  []      Palliative Performance Scale: 50% - Mainly sit/lie, unable to do any work, extensive disease, considerable assistance required with self-care, normal or reduced intake, full LOC or confusion    FAST Score (Dementia Patients): N/A    ECOG (Cancer Patients): N/A    [place] Symptom Assessment Scale (ESAS):      Review of Systems   [x]  Unable to obtain secondary to medical condition  Pain: none  Pain Location and Description: no acute pain at this time.   ROS         Past Medical, Surgical and Family History   Past Medical History:   Diagnosis Date   . 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 Diabetes mellitus type 2, controlled    . Diabetic nephropathy associated with type 2 diabetes mellitus    . Diabetic nephropathy associated with type 2 diabetes mellitus    . Hepatic cyst    . Hyperlipidemia    .  Hypertension 30 years ago   . Normocytic anemia    . Parkinsons    . Stented coronary artery       Past Surgical History:   Procedure Laterality Date   . CHOLECYSTECTOMY  1990s   . CORONARY ARTERY BYPASS GRAFT  1990s   . FEMORAL BYPASS  1994      Family History   Problem Relation Age of Onset   . Hypertension Mother    . Hypertension Father        Social History   Substance Use:    reports that he has never smoked. He has never used smokeless tobacco.    reports current alcohol use of about 2.0 standard drinks of alcohol per week.    reports no history of drug use.     Marital Status: widowed    Home/Family: lives with son     Occupation/Hobbies: time with family     Cultural Concerns:    Nurse, learning disability needed: [x]  NO   []  YES                                  Spirituality and Importance: No religion on file , son reports [employer]     Medications   Scheduled Meds  Current Facility-Administered Medications   Medication Dose Route Frequency   . albumin human  25 g Intravenous Once   . fluconazole  400 mg Intravenous Q24H   . heparin (porcine)  5,000 Units Subcutaneous Q8H SCH    . pantoprazole  40 mg Intravenous Daily   . piperacillin-tazobactam  2.25 g Intravenous Q6H   . vancomycin   Intravenous See Admin Instructions      DRIPS  . norepinephrine (LEVOPHED) infusion 4 mcg/min (11/28/19 1000)   . Plasma-Lyte A 50 mL/hr at 11/28/19 1000      PRN MEDS  Current Facility-Administered Medications   Medication Dose   . fentaNYL (PF)  25 mcg       Allergies   No Known Allergies    Physical Exam   BP 117/66   Pulse (!) 113   Temp 98.4 F (36.9 C) (Axillary)   Resp (!) 30   Ht 1.803 m (5\' 11" )   Wt 74.7 kg (164 lb 10.9 oz)   SpO2 96%   BMI 22.97 kg/m    Physical Exam:  General: frail, chronically ill appearing elderly male in no acute distress   HEENT:  EOMI, sclera anicteric, OP/OC clear without lesions or thrush, MMM  Neck: supple, FROM   CV: regular rate and rhythm, no murmurs, rubs or gallops  Lungs:unlabored, CTAB, no wheeze   Abd: soft, NT, ND, +bs, no rebound or guarding  Ext: no clubbing, cyanosis, or edema  Neuro: awake, alert, oriented to person/ place, no focal deficits  Psych:  appropriate insight and judgement, mood and affect   Skin: no rashes or lesions noted      Labs / Radiology   Lab and diagnostics: reviewed in Epic  Recent Labs   Lab 11/28/19  0502   WBC 24.05*   Hgb 10.6*   Hematocrit 29.8*   Platelets 342              Recent Labs   Lab 11/28/19  0502   Sodium 136   Potassium 3.8   Chloride 100   CO2 22   BUN 46*   Creatinine 2.1*   EGFR 30.7   Glucose  163*   Calcium 8.7     Recent Labs   Lab 11/27/19  2311   Bilirubin, Total 1.1   Protein, Total 6.0   Albumin 2.6*   ALT <6   AST (SGOT) 16          CT Head WO Contrast    Result Date: 11/28/2019   1. No CT evidence of acute intracranial abnormality. 2. Cerebral volume loss, intracranial atherosclerosis and mild sequela of chronic small vessel ischemic disease. Waynard Edwards, MD  11/28/2019 11:37 AM    XR Chest AP Portable    Result Date: 11/28/2019  1. Stable right-sided chest tube with small residual right-sided  pneumothorax. 2. Interval placement of a right central venous catheter with its distal tip terminating over the SVC. 3. Persistent patchy airspace opacities within the right mid to lower lung field. 4. Mild left basilar atelectasis. Judd Gaudier, MD  11/28/2019 3:01 AM    Chest AP Portable    Result Date: 11/28/2019  1. Interval placement of a right-sided chest tube with slight interval decrease in size of a small right-sided pneumothorax. 2. Persistent patchy consolidative airspace opacities within the right mid to lower lung field. 3. Mild left basilar atelectasis. Judd Gaudier, MD  11/28/2019 1:18 AM    Chest AP Portable    Result Date: 11/27/2019  Moderate right hydropneumothorax. Patchy consolidation in the right mid to lower lung and left lower lobe likely reflects multifocal pneumonia. These critical results were discussed with Dr. Melida Gimenez at 11/27/2019 11:33 PM. Theador Hawthorne, MD  11/27/2019 11:34 PM

## 2019-11-29 ENCOUNTER — Inpatient Hospital Stay: Payer: No Typology Code available for payment source

## 2019-11-29 ENCOUNTER — Other Ambulatory Visit: Payer: Self-pay

## 2019-11-29 DIAGNOSIS — G20 Parkinson's disease: Secondary | ICD-10-CM

## 2019-11-29 DIAGNOSIS — K224 Dyskinesia of esophagus: Secondary | ICD-10-CM

## 2019-11-29 DIAGNOSIS — J869 Pyothorax without fistula: Secondary | ICD-10-CM

## 2019-11-29 DIAGNOSIS — N179 Acute kidney failure, unspecified: Secondary | ICD-10-CM

## 2019-11-29 DIAGNOSIS — R6521 Severe sepsis with septic shock: Secondary | ICD-10-CM

## 2019-11-29 DIAGNOSIS — J189 Pneumonia, unspecified organism: Secondary | ICD-10-CM

## 2019-11-29 DIAGNOSIS — D649 Anemia, unspecified: Secondary | ICD-10-CM

## 2019-11-29 LAB — CELL MORPHOLOGY
Cell Morphology: NORMAL
Platelet Estimate: NORMAL

## 2019-11-29 LAB — BASIC METABOLIC PANEL
Anion Gap: 12 (ref 5.0–15.0)
BUN: 55 mg/dL — ABNORMAL HIGH (ref 9–28)
CO2: 23 mEq/L (ref 22–29)
Calcium: 8.4 mg/dL (ref 7.9–10.2)
Chloride: 103 mEq/L (ref 100–111)
Creatinine: 1.9 mg/dL — ABNORMAL HIGH (ref 0.7–1.3)
Glucose: 167 mg/dL — ABNORMAL HIGH (ref 70–100)
Potassium: 3.3 mEq/L — ABNORMAL LOW (ref 3.5–5.1)
Sodium: 138 mEq/L (ref 136–145)

## 2019-11-29 LAB — CBC AND DIFFERENTIAL
Absolute NRBC: 0 10*3/uL (ref 0.00–0.00)
Hematocrit: 26.7 % — ABNORMAL LOW (ref 37.6–49.6)
Hgb: 9.5 g/dL — ABNORMAL LOW (ref 12.5–17.1)
MCH: 30.2 pg (ref 25.1–33.5)
MCHC: 35.6 g/dL (ref 31.5–35.8)
MCV: 84.8 fL (ref 78.0–96.0)
MPV: 9 fL (ref 8.9–12.5)
Nucleated RBC: 0 /100 WBC (ref 0.0–0.0)
Platelets: 288 10*3/uL (ref 142–346)
RBC: 3.15 10*6/uL — ABNORMAL LOW (ref 4.20–5.90)
RDW: 13 % (ref 11–15)
WBC: 16.17 10*3/uL — ABNORMAL HIGH (ref 3.10–9.50)

## 2019-11-29 LAB — MAN DIFF ONLY
Band Neutrophils Absolute: 4.2 10*3/uL — ABNORMAL HIGH (ref 0.00–1.00)
Band Neutrophils: 26 %
Basophils Absolute Manual: 0 10*3/uL (ref 0.00–0.08)
Basophils Manual: 0 %
Eosinophils Absolute Manual: 0 10*3/uL (ref 0.00–0.44)
Eosinophils Manual: 0 %
Lymphocytes Absolute Manual: 0.32 10*3/uL — ABNORMAL LOW (ref 0.42–3.22)
Lymphocytes Manual: 2 %
Monocytes Absolute: 0.16 10*3/uL — ABNORMAL LOW (ref 0.21–0.85)
Monocytes Manual: 1 %
Neutrophils Absolute Manual: 11.48 10*3/uL — ABNORMAL HIGH (ref 1.10–6.33)
Segmented Neutrophils: 71 %

## 2019-11-29 LAB — GFR: EGFR: 34.5

## 2019-11-29 LAB — GLUCOSE WHOLE BLOOD - POCT
Whole Blood Glucose POCT: 133 mg/dL — ABNORMAL HIGH (ref 70–100)
Whole Blood Glucose POCT: 142 mg/dL — ABNORMAL HIGH (ref 70–100)
Whole Blood Glucose POCT: 147 mg/dL — ABNORMAL HIGH (ref 70–100)
Whole Blood Glucose POCT: 148 mg/dL — ABNORMAL HIGH (ref 70–100)
Whole Blood Glucose POCT: 152 mg/dL — ABNORMAL HIGH (ref 70–100)

## 2019-11-29 LAB — MRSA CULTURE
Culture MRSA Surveillance: NEGATIVE
Culture MRSA Surveillance: NEGATIVE

## 2019-11-29 LAB — MAGNESIUM: Magnesium: 2.3 mg/dL (ref 1.6–2.6)

## 2019-11-29 MED ORDER — GLUCAGON 1 MG IJ SOLR (WRAP)
1.0000 mg | INTRAMUSCULAR | Status: DC | PRN
Start: 2019-11-29 — End: 2019-11-30

## 2019-11-29 MED ORDER — VANCOMYCIN PHARMACY TO DOSE PLACEHOLDER
INTRAVENOUS | Status: DC
Start: 2019-11-29 — End: 2019-11-29

## 2019-11-29 MED ORDER — INSULIN LISPRO 100 UNIT/ML SC SOLN
1.0000 [IU] | SUBCUTANEOUS | Status: DC
Start: 2019-11-29 — End: 2019-11-30

## 2019-11-29 MED ORDER — POTASSIUM CHLORIDE 20 MEQ/50ML IV SOLN
20.0000 meq | INTRAVENOUS | Status: AC
Start: 2019-11-29 — End: 2019-11-29
  Administered 2019-11-29 (×2): 20 meq via INTRAVENOUS
  Filled 2019-11-29 (×2): qty 50

## 2019-11-29 MED ORDER — DEXTROSE 50 % IV SOLN
12.5000 g | INTRAVENOUS | Status: DC | PRN
Start: 2019-11-29 — End: 2019-11-30

## 2019-11-29 MED ORDER — SODIUM CHLORIDE 0.9 % IV MBP
100.0000 mg | INTRAVENOUS | Status: DC
Start: 2019-11-29 — End: 2019-11-30
  Administered 2019-11-29 – 2019-11-30 (×2): 100 mg via INTRAVENOUS
  Filled 2019-11-29 (×3): qty 10

## 2019-11-29 MED ORDER — GLUCOSE 40 % PO GEL
15.0000 g | ORAL | Status: DC | PRN
Start: 2019-11-29 — End: 2019-11-30

## 2019-11-29 MED ORDER — POLYVINYL ALCOHOL 1.4 % OP SOLN
1.0000 [drp] | OPHTHALMIC | Status: DC | PRN
Start: 2019-11-29 — End: 2019-12-01
  Administered 2019-11-30: 09:00:00 1 [drp] via OPHTHALMIC
  Filled 2019-11-29: qty 15

## 2019-11-29 NOTE — SLP Eval Note (Addendum)
Pt remains strict NPO. Will await decision by team to determine plan. Obvious for now, it seems pt will be NPO for at least a short period. Will discontinue ST eval for now. Please re-order ST when pt is approp for PO trials.      Blondell Reveal, MS, CCC-SLP    [place] # [phone]

## 2019-11-29 NOTE — Progress Notes (Signed)
[facility]  PROGRESS NOTE  [facility] call: [phone]  [facility] call: [phone]  [facility] call: [phone]  After hours call [phone]    Date Time: 11/29/19 11:21 AM  Patient Name: [name] [name],[name] [name]  Requesting Physician: Su Hoff,*      Chief Complaint:   Esophageal Distension  Dysphagia   Right chest empyema     Assessment and Plan:   Assessment:  79 y.o. male with history of Parkinson's Disease, Type 2 Diabetes, HLD, PAD s/p femoral bypass, CAD s/p CABG 2006, HTN, who presents to the hospital on 11/27/2019 with progressively worsening cough, fatigue, shortness of breath, decreased appetite. Septic shock related to RLL pneumonia, possible necrotizing pneumonia from aspiration vs esophageal distension and perforation to right pleural space with resulting abscess/ empyema.    1. Distended Esophagus on CT - Progressively worsening solid and liquid dysphagia over the last year. OP EGD was planned prior to this admission with consideration for esophageal dysmotility, stricture or malignancy. CT Chest W/IV Contrast 11/2 revealed extensive loculated mottled air and tissue density in the medial right lower chest, of similar appearance and potentially contiguous to that within a massively distended esophagus. Differential diagnosis includes necrotizing pneumonia from aspiration, vs esophageal perforation with mediastinitis/abscess.      2. Right chest empyema - Possible necrotizing pneumonia from aspiration vs esophageal perforation with abscess formation. S/p chest tube placement with 500 cc purulent fluid removed. Culture w/ GPR and budding yeast. ID following.     3. Septic Shock - 2/2 to #2. On low dose Levophed. Empiric antibiotics w/ vanc, zosyn and micafungin per ID.    Plan:  1. Defer EGD at this time as patient is not stable, on pressors and amiodarone gtt.  2. Recommend evaluation by cardiothoracic surgery as the CT Chest from 11/28/19 shows the distended esophagus appears contiguous with the air and  tissue density within the medial right lower chest and likely not amenable to endoscopic treatment.  This was communicated with the ICU attending Armenta Corona at 11 am.  3. Continue with antibiotics per infectious disease.     Subjective:   [name] [name] seen today resting in bed with sitter at bedside. He remains in the ICU, on pressors d/t labile blood pressure and amiodarone drip. Nursing reports several episodes of Torsades de Pointes overnight. He is confused and oriented to self only. He endorses some SOB but denies any abdominal pain, N/V.     Medications:     Current Facility-Administered Medications   Medication Dose Route Frequency   . heparin (porcine)  5,000 Units Subcutaneous Q8H SCH   . insulin lispro  1-5 Units Subcutaneous Q4H SCH   . micafungin  100 mg Intravenous Q24H   . pantoprazole  40 mg Intravenous Daily   . piperacillin-tazobactam  2.25 g Intravenous Q6H       Review of Systems:   General:  Patient denies lack of appetite, night sweats, weight loss, fatigue, fever.   HEENT:  Patient denies headache, hoarseness   Cardiovascular:  Patient denies swelling of hands/feet, fainting/blacking out, chest pain.   Respiratory:  Patient denies chronic cough, difficulty breathing, wheezing.   Genitourinary:  Patient denies blood in urine, dark urine  Musculoskeletal: Patient denies joint pain, joint stiffness, joint swelling.   Skin:  Patient denies itching, rash.   Neurologic:  Patient denies dizziness, loss of consciousness, fainting, confusion  Heme/Lymphatic:  Patient denies easy bruising.       Pertinent positives noted in HPI.  Physical Exam:     Vitals:    11/29/19 1045   BP: 104/57   Pulse: (!) 113   Resp: (!) 78   Temp:    SpO2: 97%     General appearance: +Ill appearing, appears stated age.   Eyes: Sclera anicteric, pink conjunctivae, no ptosis  ENMT: mucous membranes moist, nose and ears appear normal.  Oropharynx clear.  Chest: tachypnea, no clubbing or cyanosis  CV:  +Tachycardic,  regular rhythm, no JVD, no LE edema  Abdomen: soft, non-tender, non-distended, no masses or organomegaly  Skin: Normal color and turgor, no rashes, no suspicious skin lesions noted  Neuro: CN II-XII grossly intact.  No gross movement disorders noted.  Mental status: Appropriate affect, alert and oriented x 3      Labs:     Recent Labs     11/29/19  0353 11/28/19  0502   WBC 16.17* 24.05*   Hgb 9.5* 10.6*   Hematocrit 26.7* 29.8*   Platelets 288 342   MCV 84.8 85.9       Recent Labs     11/29/19  0353 11/28/19  0502   Sodium 138 136   Potassium 3.3* 3.8   Chloride 103 100   CO2 23 22   BUN 55* 46*   Creatinine 1.9* 2.1*   Glucose 167* 163*   Calcium 8.4 8.7   Magnesium 2.3 2.2       Recent Labs     11/27/19  2311   AST (SGOT) 16   ALT <6   Alkaline Phosphatase 80   Bilirubin, Total 1.1   Protein, Total 6.0   Albumin 2.6*       No results for input(s): PTT, PT, INR in the last 72 hours.     Radiology:   Radiological Procedure reviewed:    CT chest without contrast 11/28/19:   1. Extensive loculated mottled air and tissue density in the medial right lower chest, of similar appearance and potentially contiguous to that within a massively distended esophagus. Differential diagnosis includes necrotizing pneumonia from aspiration, vs esophageal perforation with mediastinitis/abscess.  2. A right pleural tube is in place, currently with adjacent trace right  pleural effusion and minimal right pneumothorax.  3. Consolidation involves both lower lobes.  4. Postoperative changes from previous coronary bypass.  5. Urgent findings discussed with and acknowledged by Dr. Eston Mould at  1:30 PM.    Endoscopy:       Pathology:

## 2019-11-29 NOTE — Progress Notes (Addendum)
Available in House: 9a - 6p  [phone]     Palliative Care Progress Note   Date Time: 11/29/19 11:40 AM   Patient Name: [name] [name], [name] [name]   Location: [id number]   Attending Physician: Inez Pilgrim, Domenica Reamer,*   Primary Care Physician: [name], MD   Consulting Provider: Frederik Pear, NP   Consulting Service: Palliative Medicine   Consulted request from [facility], Domenica Reamer,* to see patient regarding:   Reason for Referral: Clarify goals of care; Advance care planning        Assessment & Plan   Impression   Armistead Sult [name] 79 y.o. male with past medical hx of Parkinson's disease, diabetes, major depressive disorder, low back pain, hepatic cyst, dyslipidemia, gouty arthropathy, diffuse esophageal spasm, CAD, and anosmia, benign essential hypertension, nocturia, optic neuritis, hyperlipidemia, pulmonary nodule, left, shoulder joint pain, tremors, ureter ache stone, chest pain, normocytic anemia, and esophageal dysphagia. Pt admitted s/p fall in home setting, as well as c/o cough for last 2 weeks.  Patient son reported him falling three times resulting in numerous scrapes to his legs.Pt admitted with septic shock. Pt  Chest AP Portable of 11/1 showed :Moderate right hydropneumothorax. Patchy consolidation in the right mid to lower lung and left lower lobe likely reflects multifocal pneumonia. Pt had placement of a right-sided chest tube with slight interval decrease in size of a small right-sided pneumothorax. Repeat xray showed persistent patchy consolidative airspace opacities within the right mid to lower lung and Mild left basilar atelectasis.     Pt seen in ICU this morning on rounds with Dr. Eston Mould and ICU team. Chart reviewed and events of last 24 hours noted. Recommendation at this time is for further evaluation of likely esophageal perforation, GI and thoracic surgery consulted by Dr. Eston Mould. Pt seen and examined today, he appears comfortable without any acute pain,  dyspnea or anxiety at this time. See goals of care as listed below.     Estimated Prognosis: Unclear   Recommendations   1. Goals of Care:  Met with son at bedside today to continue goals of care discussion. Pt is awake and alert enough to participate in conversation and son requesting updated AD form completion. Reviewed [place] POST form in detail with pt and son [name] [name] and answered all questions. At this time son does verbalize understanding of patient's fragile medical status. He does want to give him every opportunity for recovery, to include EGD and surgical interventions by thoracic team if recommended. Theodoro Grist verbalizes understanding and is supportive of further evaluation and treatment recommendations. He affirmed previous decision that pt remain DNR, no intubation, but support ok. POST form original in chart, to leave with patient when he leaves. Copies given to son and faxed to medical records.     Outcome of Discussion: treatment/curative pathway at this time with consideration of input post EGD and thoracic surgery recommendations.     ACP Validation: Valid durable DNR/POST document completed during admission  Medical Decision Maker: Next of Kin status: son: [place]  ACP Document: DDNR/POST     ADDENDUM: MET WITH PT SON POST EGD. PT AT THIS TIME WITHOUT FURTHER SURGICAL OPTIONS DUE TO FINDINGS OF EGD. PROFUSE NECROTIC TISSUE THROUGHOUT ESOPHAGUS AND UPPER GI TRACT. PT SON UNDERSTANDABLY UPSET BUT ACCEPTING OF FURTHER INFORMATION AND STATES HE WANTS PT TO BE COMFORTABLE. DISCUSSED HOSPICE/ GIP VERSUS POSSIBLE [facility] AND PLACED ORDER FOR CAPITAL CARING INFO SESSION.     CODE STATUS: NO CPR - SUPPORT  OK     2. Pain: none     . Non-Pain Symptoms:   Dyspnea: Continue to monitor, patient at risk for increased dyspnea with empyema  Bowel Habits: With history of constipation continue to monitor, consider additional bowel regimen such as Peri-Colace or MiraLAX for patient with Parkinson's.  Appetite/Nutrition:  Recommend nutritional consult  Nausea/Vomiting: Monitor for nausea and vomiting no acute concerns at this time  Mood/Agitation: Monitor for increased confusion/agitation  Delirium: Monitor as pt will be at risk for hospital delirium  Insomnia: Melatonin 3 mg PO at bedtime PRN  4. Psychosocial: Offer support and encouragement to pt and son.    5. Spiritual: No religion on file.    6. PC Team follow-up plans: in the next few days      Discharge Disposition: TBD      Outpatient Follow Up Recommended: Yes    Outcomes: Family meetings, Clarified goals of care, Provided advance care planning, Provided psychosocial or spiritual support, Completed durable DNR, POST counseling and POST completion    45 minutes. Total time today in care of patient including chart review, evaluation, management, counseling & coordination of care with Patient, Family, Consultants, referring provider, Bedside Nurse, Charge Nurse and Case manager with recommendations above >50% of time on floor.     Frederik Pear, NP  Palliative Medicine  [phone] 9a-6p     Interval History   [name] [name] is a 79 y.o. male admitted to hospital on 11/27/2019 with Empyema of lung [J86.9]  Pneumothorax on right [J93.9]      Goals of Care   CURRENT CPR Status: NO CPR - SUPPORT OK       Advanced Care Planning:      Decisional Capacity: yes      Advance Directives have been completed in the past: yes      Advance Directives are available in chart: yes      Discussed this admission: yes      ACP note completed: no       Palliative Functional and Symptom Assessment     Palliative Performance Scale: 30% - Totally bed bound, unable to do any work, extensive disease, total care, reduced intake, full LOC, drowsy or confusion    [place] Symptom Assessment Scale (ESAS): Unable to assess       Medications   Scheduled Meds  Current Facility-Administered Medications   Medication Dose Route Frequency   . heparin (porcine)  5,000 Units Subcutaneous Q8H SCH   .  insulin lispro  1-5 Units Subcutaneous Q4H SCH   . micafungin  100 mg Intravenous Q24H   . pantoprazole  40 mg Intravenous Daily   . piperacillin-tazobactam  2.25 g Intravenous Q6H      DRIPS  . amiodarone 0.501 mg/min (11/29/19 0900)   . norepinephrine (LEVOPHED) infusion 1 mcg/min (11/29/19 1003)      PRN MEDS  Current Facility-Administered Medications   Medication Dose   . dextrose  15 g of glucose    And   . dextrose  12.5 g    And   . glucagon (rDNA)  1 mg   . dextrose  15 g of glucose    And   . dextrose  12.5 g    And   . glucagon (rDNA)  1 mg   . fentaNYL (PF)  25 mcg       Allergies   No Known Allergies    Physical Exam   BP (!) 132/94   Pulse Marland Kitchen)  119   Temp 97.8 F (36.6 C) (Oral)   Resp (!) 45   Ht 1.803 m (5\' 11" )   Wt 74.7 kg (164 lb 10.9 oz)   SpO2 95%   BMI 22.97 kg/m    Physical Exam:  General: Chronically ill-appearing elderly male in no acute distress   HEENT:  EOMI, sclera anicteric, OP/OC clear , dry MM  Neck: supple, FROM   CV: regular rate and rhythm, no murmurs, rubs or gallops  Lungs:unlabored, CTAB, no wheeze   Abd: soft, NT, ND, +bs, no rebound or guarding  Ext: no clubbing, cyanosis, or edema  Neuro: awake, alert, oriented x 3, mild intermittent tremors noted in hands ,no focal deficits  Psych:  appropriate insight and judgement, mood and affect quiet, pleasantly cooperative  Skin: no rashes or lesions noted      Labs / Radiology   Lab and diagnostics: reviewed in Epic  Recent Labs   Lab 11/29/19  0353   WBC 16.17*   Hgb 9.5*   Hematocrit 26.7*   Platelets 288              Recent Labs   Lab 11/29/19  0353   Sodium 138   Potassium 3.3*   Chloride 103   CO2 23   BUN 55*   Creatinine 1.9*   EGFR 34.5   Glucose 167*   Calcium 8.4     Recent Labs   Lab 11/27/19  2311   Bilirubin, Total 1.1   Protein, Total 6.0   Albumin 2.6*   ALT <6   AST (SGOT) 16          CT Head WO Contrast    Result Date: 11/28/2019   1. No CT evidence of acute intracranial abnormality. 2. Cerebral volume loss,  intracranial atherosclerosis and mild sequela of chronic small vessel ischemic disease. Waynard Edwards, MD  11/28/2019 11:37 AM    CT Chest WO Contrast    Result Date: 11/28/2019  1. Extensive loculated mottled air and tissue density in the medial right lower chest, of similar appearance and potentially contiguous to that within a massively distended esophagus. Differential diagnosis includes necrotizing pneumonia from aspiration, vs esophageal perforation with mediastinitis/abscess. 2. A right pleural tube is in place, currently with adjacent trace right pleural effusion and minimal right pneumothorax. 3. Consolidation involves both lower lobes. 4. Postoperative changes from previous coronary bypass. 5. Urgent findings discussed with and acknowledged by Dr. Eston Mould at 1:30 PM. Wilmon Pali, MD  11/28/2019 1:31 PM    XR Chest AP Portable    Result Date: 11/28/2019  1. Stable right-sided chest tube with small residual right-sided pneumothorax. 2. Interval placement of a right central venous catheter with its distal tip terminating over the SVC. 3. Persistent patchy airspace opacities within the right mid to lower lung field. 4. Mild left basilar atelectasis. Judd Gaudier, MD  11/28/2019 3:01 AM    Chest AP Portable    Result Date: 11/28/2019  1. Interval placement of a right-sided chest tube with slight interval decrease in size of a small right-sided pneumothorax. 2. Persistent patchy consolidative airspace opacities within the right mid to lower lung field. 3. Mild left basilar atelectasis. Judd Gaudier, MD  11/28/2019 1:18 AM    Chest AP Portable    Result Date: 11/27/2019  Moderate right hydropneumothorax. Patchy consolidation in the right mid to lower lung and left lower lobe likely reflects multifocal pneumonia. These critical results were discussed with Dr. Melida Gimenez at 11/27/2019 11:33  PM. Theador Hawthorne, MD  11/27/2019 11:34 PM

## 2019-11-29 NOTE — Plan of Care (Signed)
Problem: Compromised Hemodynamic Status  Goal: Vital signs and fluid balance maintained/improved  Outcome: Progressing  Flowsheets (Taken 11/29/2019 0843)  Vital signs and fluid balance are maintained/improved:  Marland Kitchen Position patient for maximum circulation/cardiac output  . Monitor/assess vitals and hemodynamic parameters with position changes  . Monitor and compare daily weight  . Monitor intake and output. Notify LIP if urine output is less than 30 mL/hour.  . Monitor/assess lab values and report abnormal values   Pt AxOx1, tremulous, and  follows commands. On amio and levophed gtt, BP labile. Pt brought down for CT of chest w/contrast, unable to tolerate PO, cough immediately, MD aware.

## 2019-11-29 NOTE — Progress Notes (Signed)
[facility] ICU Multidisciplinary Rounds   11/29/2019  9:21 AM    Team Members: [x]  MD: Armenta_ [x]  Charge RN [x]  RN  []  Pharmacist []  Dietician []  NP/PA []  CM/SW []  Clinical Director [x]  RT []  Palliative Care        Daily IP ICU Checklist:   Central Line: [x]  CVC [x]  CVC day: 1 CVC location: [x]  IJ []  SC []  Femoral []  other: ___   []  D/C CVC []  Change line   []  CVC indication: ______  IV Infusions:  Current Facility-Administered Medications   Medication   . amiodarone   . norepinephrine (LEVOPHED) infusion     . amiodarone 0.501 mg/min (11/29/19 0900)   . norepinephrine (LEVOPHED) infusion 2 mcg/min (11/29/19 0900)     VAP Bundle:   Respiratory Status: [x]  RA []  Nasal Cannula []  BiPAP []  HFNC []  Ventilator  Settings:________________  []  HOB elevated 30 degrees  []  Oral Care   []  Plan for SAT/SBT today  GI: Ulcer Prophylaxis: []  Not indicated []  H2 blocker  [x]  PPI    []  Tube feeds []  Tolerating Feeds []  TPN [x]  NPO []  Diet:____  CAUTI Prevention: []  indwelling foley catheter [x]  External cath    Foley Catheter day: _____  []  Foley indication: _______    []  Foley Secured   []  MD order in   Integumentary/VTE Prophylaxis: [x]  SCD  Chemical PPX: []  SQ lovenox [x]  SQ heparin  []  On hold:___   []  Wounds/WOCN Consult       Other:    []  Other Consults:_______  []  Family/Social:___ []  Family Meeting date: ___  []  PT/OT needed  []  Speech Therapy  []  Restraint orders  []  Glucose Controlled   []  Insulin Protocol: []  DKA []  Adult ICU Protocol []    [x]  Labs and Radiologic Studies Reviewed  []  Plan for transfer/discharge?:______     Notes:    Confused - only A&O x1  Had Torsades this am  Parkinsons so has tremors - unable to get his meds  Change accu checks to every 4 hours  Possible perf esophagus  EGD today  Has a large PNA or abcess - broad spectrum antibiotics. Positive blood cultures  Diminished urine output this shift - AKI  D/C SSI  Hold heparin

## 2019-11-29 NOTE — Progress Notes (Signed)
Report received from [facility] . Patient alert and follows command. On amiodarone drip for a-fib and low dose of levophed , keep map >65. Kept pt NPO for now. Denies pain . Son at bedside . Vital stable. Will continue monitor him again.

## 2019-11-29 NOTE — Progress Notes (Signed)
Critical Care Daily Progress Note       Active Hospital Problems    Diagnosis   . Empyema of lung        Impression and Plan:  1. Septic shock / Right chest empyema - On low dose levophed (currently at 2 mcg/min). Improving leukocytosis, 26% bands. No indication for stress dose steroids. Non invasive BP monitoring for now. Continue empiric antibiotics. CT output ~820 cc / 24 hrs, continue suction. Empiric antibiotics w/ vanc, zosyn and micafungin.  2. RLL pneumonia - Possible necrotizing pneumonia from aspiration vs esophageal distension and perforation to right pleural space w/ resulting abscess / empyema. 14 Fr right pigtail inserted in ER. Culture w/ GPR and budding yeast. Continue vanc / zosyn / fluconazole. ID following.  3. Esophageal dysmotility - Seen by GI as outpatient (Dr Dorthy Cooler). Planned to do EGD. Continue PPI. Keep NPO. GI following. Goals of care discussion w/ family as patient may need to get intubated for procedure.  4. AKI - Due to shock. Borderline urine output. Renally dosed meds. Avoid nephrotoxic agents. Poor candidate for RRT.  5. Acute anemia - No evidence of blood loss, no indication for transfusion. Continue to monitor daily CBC. Plt 288.  6. CAD - S/P 4vCABG in 2006. Resume medical management when able.  7. Parkinson's disease - Resume medical management when able. Currently NPO.  8. Code Status - do not resuscitate    Medications:  Current Facility-Administered Medications   Medication Dose Route Frequency   . heparin (porcine)  5,000 Units Subcutaneous Q8H SCH   . insulin lispro  1-5 Units Subcutaneous Q4H SCH   . micafungin  100 mg Intravenous Q24H   . pantoprazole  40 mg Intravenous Daily   . piperacillin-tazobactam  2.25 g Intravenous Q6H       Infusions:  . amiodarone 0.501 mg/min (11/29/19 0900)   . norepinephrine (LEVOPHED) infusion 1 mcg/min (11/29/19 1003)       Past 24 hour events significant for: Pt is awake and alert, follows commands. On low dose levophed and oxygenating  well on RA. Denies pain or dyspnea. Patient has parkison's disease.       Physical Examination:  BP 131/67   Pulse 85   Temp 97.9 F (36.6 C) (Oral)   Resp (!) 48   Ht 1.803 m (5\' 11" )   Wt 74.7 kg (164 lb 10.9 oz)   SpO2 98%   BMI 22.97 kg/m       NECK: Supple; No JVD  LUNGS: Absent breath sounds to right base. Otherwise clear to auscultation, no wheezing.  HEART: RRR no MGR  ABD: NABS; SNT; active bowel sounds  EXT: No Edema; Peripheral pulses intact; Cap refill normal  NEURO: Awake and alert; follows commands; Grossly non-focal  LINES: RIJ TLC  PROPHY: UFH, PPI    Labs and x-rays reviewed    Critical care time excluding procedures = 40 min    Greater than 50% spent in counseling and coordination of care regarding current medical management and future plan of care

## 2019-11-29 NOTE — Progress Notes (Addendum)
ID CONSULTATION FOLLOW UP NOTE    Date Time: 11/29/19 12:10 PM  Patient Name: [name] [name], [name] [name] [id number]  Requesting Physician: Inez Pilgrim, Domenica Reamer,*     Antimicrobials   Zosyn 3  Micafungin1    Prev fluconazole  Stopping vancomycin  Lines/Foley   CVC R IJ  PIV  R chest tube    Assessment/Plan   63M w/ PMH parkinson's dementia, esophageal dysmotility presents with    Septic shock  Likely esophageal perforation with empyema/necrotizing pneumonia on CT chest.  Cultures with lactobacillus spp and C krusei.  GI on board, possible EGD upcoming.   Would get CT surgery evaluation too.   Palliative care on board, would clarify GOC.    Continue zosyn and micafungin added today.   Stopping vancomycin.     Melodie Bouillon, MD  Pager [phone]  [facility]  Office: [phone]  Fax: [phone]   Microbiology   11/1 BCx GPR  11/1 GPR  11/2 Fluid culture: Lactobacillus, C krusei    Subjective   He is awake and can participate in interview a bit. He reports some  SOB. Denies abd pain.   Allergies:   No Known Allergies    Medications:     Current Facility-Administered Medications   Medication Dose Route Frequency   . heparin (porcine)  5,000 Units Subcutaneous Q8H SCH   . insulin lispro  1-5 Units Subcutaneous Q4H SCH   . micafungin  100 mg Intravenous Q24H   . pantoprazole  40 mg Intravenous Daily   . piperacillin-tazobactam  2.25 g Intravenous Q6H       Review of Systems:   Positives in BOLD:    General: Fever, Fatigue, weight gain, weight loss, malaise  HEENT: Headache, ear ache, blurry vision, vision loss, eye pain, nasal discharge, sneezing, sore throat, lymphadenopathy  CVS: tachycardia, chest pain, shortness of breath, DOE  Lungs: Wheezing, cough, hemoptysis  Abd: Abd pain, nausea, vomiting, diarrhea, constipation, melena, hematochezia, hematemesis  Extremities: Swelling, pain, weakness  Skin: rash, ecchymosis, pruritis  Neurological: confusion, altered mental status, photophobia, weakness,  numbness    Physical Exam:     Vitals:    11/29/19 1130   BP:    Pulse: (!) 119   Resp: (!) 45   Temp: 97.8 F (36.6 C)   SpO2: 95%       General Elderly male   HEENT PERRL, EOMI, poor dentition   Heart On pressors   Lung Coarse breath sounds bilaterally   Abdomen Mild distention, thin   Extremities No dedema   Skin Pallor   Neuro Awake and alert x2, follow commands, has resting tremor     Labs Reviewed:      Recent CBC WITH DIFF   Recent Labs     11/29/19  0353   WBC 16.17*   RBC 3.15*   Hgb 9.5*   Hematocrit 26.7*   MCV 84.8   Platelets 288       Recent CMP   Recent Labs     11/29/19  0353   Glucose 167*   BUN 55*   Creatinine 1.9*   Sodium 138   Potassium 3.3*   CO2 23       Recent Labs   Lab 11/29/19  0353 11/28/19  0502 11/27/19  2311   WBC 16.17* 24.05* 23.57*   RBC 3.15* 3.47* 4.19*   Hgb 9.5* 10.6* 12.6   Hematocrit 26.7* 29.8* 36.7*   MCV 84.8 85.9 87.6   MCHC 35.6 35.6  34.3   RDW 13 13 13    MPV 9.0 9.3 9.3   Platelets 288 342 437*       Recent Labs   Lab 11/29/19  0353 11/28/19  0502 11/27/19  2311   Sodium 138 136 134*   Potassium 3.3* 3.8 3.3*   Chloride 103 100 93*   CO2 23 22 16*   BUN 55* 46* 47*   Creatinine 1.9* 2.1* 2.5*   Glucose 167* 163* 190*   Calcium 8.4 8.7 10.1   Magnesium 2.3 2.2  --        Recent Labs   Lab 11/27/19  2311   ALT <6   AST (SGOT) 16   Bilirubin, Total 1.1   Albumin 2.6*   Alkaline Phosphatase 80       Radiology   CT Head WO Contrast    Result Date: 11/28/2019   1. No CT evidence of acute intracranial abnormality. 2. Cerebral volume loss, intracranial atherosclerosis and mild sequela of chronic small vessel ischemic disease. Waynard Edwards, MD  11/28/2019 11:37 AM    CT Chest WO Contrast    Result Date: 11/28/2019  1. Extensive loculated mottled air and tissue density in the medial right lower chest, of similar appearance and potentially contiguous to that within a massively distended esophagus. Differential diagnosis includes necrotizing pneumonia from aspiration, vs  esophageal perforation with mediastinitis/abscess. 2. A right pleural tube is in place, currently with adjacent trace right pleural effusion and minimal right pneumothorax. 3. Consolidation involves both lower lobes. 4. Postoperative changes from previous coronary bypass. 5. Urgent findings discussed with and acknowledged by Dr. Eston Mould at 1:30 PM. Wilmon Pali, MD  11/28/2019 1:31 PM    XR Chest AP Portable    Result Date: 11/28/2019  1. Stable right-sided chest tube with small residual right-sided pneumothorax. 2. Interval placement of a right central venous catheter with its distal tip terminating over the SVC. 3. Persistent patchy airspace opacities within the right mid to lower lung field. 4. Mild left basilar atelectasis. Judd Gaudier, MD  11/28/2019 3:01 AM    Chest AP Portable    Result Date: 11/28/2019  1. Interval placement of a right-sided chest tube with slight interval decrease in size of a small right-sided pneumothorax. 2. Persistent patchy consolidative airspace opacities within the right mid to lower lung field. 3. Mild left basilar atelectasis. Judd Gaudier, MD  11/28/2019 1:18 AM    Chest AP Portable    Result Date: 11/27/2019  Moderate right hydropneumothorax. Patchy consolidation in the right mid to lower lung and left lower lobe likely reflects multifocal pneumonia. These critical results were discussed with Dr. Melida Gimenez at 11/27/2019 11:33 PM. Theador Hawthorne, MD  11/27/2019 11:34 PM

## 2019-11-29 NOTE — Significant Event (Signed)
This morning I spoke to Dr Liane Comber (pulmonary medicine / thoracic surgery) at [facility]. Recommended to obtain an esophagram to rule in / out the diagnosis of esophageal perforation. The study was limited, as there was a limited amount of contrast that the patient could take. There was no extravasation of contrast. On a follow up call Dr Liane Comber suggested to have GI do an upper endoscopy first to assess the anatomy of his esophagus.

## 2019-11-29 NOTE — Plan of Care (Signed)
Problem: Moderate/High Fall Risk Score >5  Goal: Patient will remain free of falls  Outcome: Progressing  Flowsheets (Taken 11/28/2019 2000)  High (Greater than 13):   HIGH-Bed alarm on at all times while patient in bed   HIGH-Apply yellow "Fall Risk" arm band   HIGH-Consider use of low bed     Problem: Compromised Hemodynamic Status  Goal: Vital signs and fluid balance maintained/improved  Outcome: Progressing  Flowsheets (Taken 11/29/2019 0706)  Vital signs and fluid balance are maintained/improved:   Position patient for maximum circulation/cardiac output   Monitor/assess vitals and hemodynamic parameters with position changes   Monitor intake and output. Notify LIP if urine output is less than 30 mL/hour.   Monitor and compare daily weight   Monitor/assess lab values and report abnormal values     Shift Notes:  Continued to monitor patient labs and vital signs. Patient had runs of torsades this shift at 0618 - pt was asymptomatic. PA is aware - see orders. Patient has a lot of very thick secretions that cannot be removed unless deep suction is performed. Will relay to next shift. No other significant events at this time. Will continue to monitor patient closely.

## 2019-11-29 NOTE — Significant Event (Signed)
Received a call from radiology (Dr Tomie China) about a retained guide wire in a pigtail catheter chest tube. I disconnected the tube and was able to extract the wire in its entirety without complications.

## 2019-11-30 ENCOUNTER — Ambulatory Visit: Payer: Self-pay

## 2019-11-30 ENCOUNTER — Inpatient Hospital Stay: Payer: No Typology Code available for payment source

## 2019-11-30 ENCOUNTER — Encounter
Admission: EM | Disposition: A | Discharge status: Hospice | Payer: Self-pay | Source: Home / Self Care | Attending: Internal Medicine

## 2019-11-30 ENCOUNTER — Inpatient Hospital Stay: Payer: No Typology Code available for payment source | Admitting: Pain Medicine

## 2019-11-30 ENCOUNTER — Encounter: Payer: Self-pay | Admitting: Internal Medicine

## 2019-11-30 DIAGNOSIS — R410 Disorientation, unspecified: Secondary | ICD-10-CM

## 2019-11-30 LAB — ECG 12-LEAD
Q-T Interval: 368 ms
QRS Duration: 94 ms
QTC Calculation (Bezet): 447 ms
R Axis: 28 degrees
T Axis: 127 degrees
Ventricular Rate: 89 {beats}/min

## 2019-11-30 LAB — CBC AND DIFFERENTIAL
Absolute NRBC: 0 10*3/uL (ref 0.00–0.00)
Basophils Absolute Automated: 0.06 10*3/uL (ref 0.00–0.08)
Basophils Automated: 0.3 %
Eosinophils Absolute Automated: 0.01 10*3/uL (ref 0.00–0.44)
Eosinophils Automated: 0.1 %
Hematocrit: 28.8 % — ABNORMAL LOW (ref 37.6–49.6)
Hgb: 10.3 g/dL — ABNORMAL LOW (ref 12.5–17.1)
Immature Granulocytes Absolute: 0.14 10*3/uL — ABNORMAL HIGH (ref 0.00–0.07)
Immature Granulocytes: 0.8 %
Lymphocytes Absolute Automated: 0.25 10*3/uL — ABNORMAL LOW (ref 0.42–3.22)
Lymphocytes Automated: 1.3 %
MCH: 30.3 pg (ref 25.1–33.5)
MCHC: 35.8 g/dL (ref 31.5–35.8)
MCV: 84.7 fL (ref 78.0–96.0)
MPV: 9.3 fL (ref 8.9–12.5)
Monocytes Absolute Automated: 0.3 10*3/uL (ref 0.21–0.85)
Monocytes: 1.6 %
Neutrophils Absolute: 17.76 10*3/uL — ABNORMAL HIGH (ref 1.10–6.33)
Neutrophils: 95.9 %
Nucleated RBC: 0 /100 WBC (ref 0.0–0.0)
Platelets: 281 10*3/uL (ref 142–346)
RBC: 3.4 10*6/uL — ABNORMAL LOW (ref 4.20–5.90)
RDW: 14 % (ref 11–15)
WBC: 18.52 10*3/uL — ABNORMAL HIGH (ref 3.10–9.50)

## 2019-11-30 LAB — BASIC METABOLIC PANEL
Anion Gap: 15 (ref 5.0–15.0)
BUN: 60 mg/dL — ABNORMAL HIGH (ref 9–28)
CO2: 19 mEq/L — ABNORMAL LOW (ref 22–29)
Calcium: 8.4 mg/dL (ref 7.9–10.2)
Chloride: 106 mEq/L (ref 100–111)
Creatinine: 1.7 mg/dL — ABNORMAL HIGH (ref 0.7–1.3)
Glucose: 168 mg/dL — ABNORMAL HIGH (ref 70–100)
Potassium: 3.3 mEq/L — ABNORMAL LOW (ref 3.5–5.1)
Sodium: 140 mEq/L (ref 136–145)

## 2019-11-30 LAB — GLUCOSE WHOLE BLOOD - POCT
Whole Blood Glucose POCT: 174 mg/dL — ABNORMAL HIGH (ref 70–100)
Whole Blood Glucose POCT: 166 mg/dL — ABNORMAL HIGH (ref 70–100)
Whole Blood Glucose POCT: 172 mg/dL — ABNORMAL HIGH (ref 70–100)
Whole Blood Glucose POCT: 159 mg/dL — ABNORMAL HIGH (ref 70–100)

## 2019-11-30 LAB — MAGNESIUM: Magnesium: 2.4 mg/dL (ref 1.6–2.6)

## 2019-11-30 LAB — GFR: EGFR: 39.2

## 2019-11-30 SURGERY — DONT USE, USE 1095-ESOPHAGOGASTRODUODENOSCOPY (EGD), DIAGNOSTIC
Anesthesia: Anesthesia General | Site: Abdomen

## 2019-11-30 MED ORDER — PHENYLEPHRINE 100 MCG/ML IV BOLUS (ANESTHESIA)
PREFILLED_SYRINGE | INTRAVENOUS | Status: DC | PRN
Start: 2019-11-30 — End: 2019-11-30
  Administered 2019-11-30: 100 ug via INTRAVENOUS

## 2019-11-30 MED ORDER — PHENYLEPHRINE HCL 10 MG/ML IV SOLN (WRAP)
Status: AC
Start: 2019-11-30 — End: ?
  Filled 2019-11-30: qty 1

## 2019-11-30 MED ORDER — ALBUMIN HUMAN/BIOSIMILIAR 5% IV SOLN (WRAP)
25.0000 g | Freq: Once | INTRAVENOUS | Status: AC
Start: 2019-11-30 — End: 2019-11-30
  Administered 2019-11-30: 09:00:00 25 g via INTRAVENOUS
  Filled 2019-11-30 (×2): qty 500

## 2019-11-30 MED ORDER — TRACE MINERALS CU-MN-SE-ZN 300-55-60-3000 MCG/ML IV SOLN
INTRAVENOUS | Status: DC
Start: 2019-11-30 — End: 2019-11-30
  Filled 2019-11-30: qty 600

## 2019-11-30 MED ORDER — PLASMA-LYTE A IV INFUSION
INTRAVENOUS | Status: DC
Start: 2019-11-30 — End: 2019-11-30

## 2019-11-30 MED ORDER — POTASSIUM CHLORIDE 20 MEQ/50ML IV SOLN
20.0000 meq | INTRAVENOUS | Status: AC
Start: 2019-11-30 — End: 2019-11-30
  Administered 2019-11-30 (×2): 20 meq via INTRAVENOUS
  Filled 2019-11-30 (×2): qty 50

## 2019-11-30 MED ORDER — SODIUM CHLORIDE (PF) 0.9 % IJ SOLN
INTRAMUSCULAR | Status: AC
Start: 2019-11-30 — End: ?
  Filled 2019-11-30: qty 10

## 2019-11-30 MED ORDER — PROPOFOL 10 MG/ML IV EMUL (WRAP)
INTRAVENOUS | Status: DC | PRN
Start: 2019-11-30 — End: 2019-11-30
  Administered 2019-11-30: 30 mg via INTRAVENOUS
  Administered 2019-11-30: 10 mg via INTRAVENOUS
  Administered 2019-11-30: 20 mg via INTRAVENOUS

## 2019-11-30 MED ORDER — EPHEDRINE SULFATE 50 MG/ML IJ/IV SOLN (WRAP)
Status: AC
Start: 2019-11-30 — End: ?
  Filled 2019-11-30: qty 1

## 2019-11-30 MED ORDER — LORAZEPAM 2 MG/ML IJ SOLN
0.5000 mg | INTRAMUSCULAR | Status: DC | PRN
Start: 2019-11-30 — End: 2019-12-01

## 2019-11-30 MED ORDER — AMIODARONE HCL IN DEXTROSE 150-4.21 MG/100ML-% IV SOLN
150.0000 mg | Freq: Once | INTRAVENOUS | Status: AC
Start: 2019-11-30 — End: 2019-11-30
  Administered 2019-11-30: 02:00:00 150 mg via INTRAVENOUS
  Filled 2019-11-30: qty 100

## 2019-11-30 MED ORDER — SODIUM CHLORIDE 0.9 % IV MBP
4.5000 g | Freq: Three times a day (TID) | INTRAVENOUS | Status: DC
Start: 2019-11-30 — End: 2019-11-30
  Administered 2019-11-30: 12:00:00 4.5 g via INTRAVENOUS
  Filled 2019-11-30 (×4): qty 20

## 2019-11-30 MED ORDER — MORPHINE SULFATE 2 MG/ML IJ/IV SOLN (WRAP)
1.0000 mg | Status: DC | PRN
Start: 2019-11-30 — End: 2019-11-30

## 2019-11-30 MED ORDER — DEXMEDETOMIDINE HCL IN NACL 1000 MCG/250ML IV SOLN
0.0000 ug/kg/h | INTRAVENOUS | Status: DC
Start: 2019-11-30 — End: 2019-12-01
  Administered 2019-11-30: 21:00:00 0.7 ug/kg/h via INTRAVENOUS
  Administered 2019-12-01: 12:00:00 1 ug/kg/h via INTRAVENOUS
  Filled 2019-11-30 (×2): qty 250

## 2019-11-30 MED ORDER — SMOFLIPID (SOY-MCT-OLIVE-FISH OIL) 20% IV INFUSION (ADULT)
50.0000 g | INTRAVENOUS | Status: DC
Start: 2019-11-30 — End: 2019-11-30
  Filled 2019-11-30: qty 250

## 2019-11-30 MED ORDER — POTASSIUM CHLORIDE 20 MEQ/50ML IV SOLN
20.0000 meq | INTRAVENOUS | Status: AC
Start: 2019-11-30 — End: 2019-11-30
  Administered 2019-11-30 (×2): 20 meq via INTRAVENOUS
  Filled 2019-11-30 (×2): qty 50

## 2019-11-30 MED ORDER — LACTATED RINGERS IV SOLN
INTRAVENOUS | Status: DC | PRN
Start: 2019-11-30 — End: 2019-11-30

## 2019-11-30 MED ORDER — MORPHINE SULFATE 2 MG/ML IJ/IV SOLN (WRAP)
2.0000 mg | Status: DC | PRN
Start: 2019-11-30 — End: 2019-12-01
  Administered 2019-12-01 (×2): 2 mg via INTRAVENOUS
  Filled 2019-11-30 (×2): qty 1

## 2019-11-30 MED ORDER — DEXMEDETOMIDINE HCL IN NACL 200 MCG/50ML IV SOLN
0.0000 ug/kg/h | INTRAVENOUS | Status: DC
Start: 2019-11-30 — End: 2019-11-30
  Administered 2019-11-30: 16:00:00 0.2 ug/kg/h via INTRAVENOUS
  Filled 2019-11-30 (×2): qty 50

## 2019-11-30 MED ORDER — LIDOCAINE HCL 2 % IJ SOLN
INTRAMUSCULAR | Status: DC | PRN
Start: 2019-11-30 — End: 2019-11-30
  Administered 2019-11-30: 100 mg via INTRAVENOUS

## 2019-11-30 SURGICAL SUPPLY — 45 items
BLOCK BITE MAXI 60FR LF STRD STRAP SDPRT (Procedure Accessories) ×1
BLOCK BITE OD60 FR STURDY STRAP SIDEPORT (Procedure Accessories) ×1
BLOCK BITE OD60 FR STURDY STRAP SIDEPORT DENTAL RETENTION RIM MAXI (Procedure Accessories) ×1 IMPLANT
CATHETER BALLOON DILATATION CRE 2.8 MM (Balloons)
CATHETER BALLOON DILATATION CRE PEBAX (Balloons)
CATHETER ELHMST HMGLD GLDPRB 7FR 300CM (Procedure Accessories)
CATHETER OD10-11-12 MM ODSEC6 FR L180 CM CREâ„¢ BALLOON DILATATION L8 CM (Balloons) IMPLANT
CATHETER OD15-16.5-18 MM ODSEC6 FR L180 CM CREâ„¢ BALLOON DILATATION L8 (Balloons) IMPLANT
CATHETER OD6 FR ODSEC12-13.5-15 MM L180 CM CREâ„¢ BALLOON DILATATION L8 (Balloons) IMPLANT
CATHETER OD6-7-8 MM ODSEC6 FR L180 CM CREâ„¢ BALLOON DILATATION L8 CM (Balloons) IMPLANT
CATHETER OD7 FR L300 CM BIPOLAR ROUND (Procedure Accessories)
CATHETER OD7 FR L300 CM BIPOLAR ROUND DISTAL TIP STANDARD CONNECTOR (Procedure Accessories) IMPLANT
DILATOR ESCP PEBAX 2.8MM CRE 10-11-12MM (Balloons)
DILATOR ESCP PEBAX 2.8MM CRE 15-16.5-18 (Balloons)
DILATOR ESCP PEBAX 2.8MM CRE 6-7-8MM 6FR (Balloons)
DILATOR ESCP PEBAX CRE 6FR 12-13.5-15MM (Balloons)
ELECTRODE ADULT PATIENT RETURN L9 FT REM POLYHESIVE ACRYLIC FOAM (Procedure Accessories) IMPLANT
ELECTRODE PATIENT RETURN L9 FT VALLEYLAB (Procedure Accessories)
ELECTRODE PT RTN RM PHSV ACRL FM C30- LB (Procedure Accessories)
FORCEPS BIOPSY L240 CM LARGE CAPACITY (Instrument) ×1
FORCEPS BIOPSY L240 CM MICROMESH TEETH STREAMLINE CATHETER NEEDLE (Instrument) ×1 IMPLANT
FORCEPS BX SS LG CPC RJ 4 2.4MM 240CM (Instrument) ×1
GLOVE EXAM LARGE NITRILE CHEMOTHERAPY POWDER FREE SENSE OATMEAL (Glove) ×3 IMPLANT
GLOVE EXAM NITRILE RESTORE LG (Glove) ×3
GLV EXAM NITRILE RESTORE LG (Glove) ×6
GOWN ISL PP PE REG LG LF FULL BCK NK TIE (Gown) ×6
GOWN ISOLATION REGULAR LARGE FULL BACK NECK TIE ELASTIC CUFF (Gown) ×3 IMPLANT
NEEDLE SCLEROTHERAPY CARR-LOCKE OD25 GA ODSEC2.5 MM L230 CM INJECTION (Needles) IMPLANT
NEEDLE SCLEROTHERAPY OD25 GA ODSEC2.5 MM (Needles)
NEEDLE SCLRTX SS TFLN CRLK 25GA 2.5MM (Needles)
PROBE ELECTROSURGICAL L220 CM FLEXIBLE (Procedure Accessories)
PROBE ELECTROSURGICAL L220 CM FLEXIBLE STRAIGHT FIRE OD2.3 MM FIAPC (Procedure Accessories) IMPLANT
PROBE ESURG FIAPC 2.3MM 220CM STRL FLXB (Procedure Accessories)
SNARE ESCP MIC CPTVTR 13MM 240IN STRL (GE Lab Supplies)
SNARE SMALL HEXAGON CAPTIVATOR STIFF ENDOSCOPIC POLYPECTOMY (GE Lab Supplies) IMPLANT
SPONGE GAUZE L4 IN X W4 IN 16 PLY (Dressing) ×1
SPONGE GAUZE L4 IN X W4 IN 16 PLY MAXIMUM ABSORBENT USP TYPE VII (Dressing) ×1 IMPLANT
SPONGE GZE CTTN CRTY 4X4IN LF NS 16 PLY (Dressing) ×1
SYRINGE 50 ML GRADUATE NONPYROGENIC DEHP (Syringes, Needles)
SYRINGE 50 ML GRADUATE NONPYROGENIC DEHP FREE PVC FREE BD MEDICAL (Syringes, Needles) IMPLANT
SYRINGE INFL 60ML ALN II STRL GA DISP (Syringes, Needles)
SYRINGE INFLATION 60 ML GAUGE CRE (Syringes, Needles) IMPLANT
SYRINGE MED 50ML LF STRL GRAD N-PYRG (Syringes, Needles)
WATER STERILE PLASTIC POUR BOTTLE 250 ML (Irrigation Solutions) ×1 IMPLANT
WATER STRL 250ML LF PLS PR BTL (Irrigation Solutions) ×1

## 2019-11-30 NOTE — Anesthesia Postprocedure Evaluation (Signed)
Anesthesia Post Evaluation    Patient: [name] [name]    Procedure(s):  EGD    Anesthesia type: general    Last Vitals:   Vitals Value Taken Time   BP 98/54 11/30/19 1630   Temp 36.9 C (98.5 F) 11/30/19 1530   Pulse 160 11/30/19 1630   Resp 37 11/30/19 1630   SpO2 92 % 11/30/19 1630                 Anesthesia Post Evaluation:     Patient Evaluated: PACU  Patient Participation: complete - patient participated  Level of Consciousness: awake  Pain Score: 0  Pain Management: adequate    Airway Patency: patent    Anesthetic complications: No      PONV Status: none    Cardiovascular status: stable  Respiratory status: room air  Hydration status: stable        Signed by: Juanell Fairly, MD, 11/30/2019 4:43 PM

## 2019-11-30 NOTE — Plan of Care (Signed)
Problem: Moderate/High Fall Risk Score >5  Goal: Patient will remain free of falls  Outcome: Progressing  Flowsheets (Taken 11/29/2019 2000)  High (Greater than 13):   HIGH-Bed alarm on at all times while patient in bed   HIGH-Apply yellow "Fall Risk" arm band   HIGH-Consider use of low bed    Note: PSA at bedside. Will continue monitoring patient closely.      Problem: Inadequate Tissue Perfusion-Venous  Goal: Tissue perfusion is adequate-venous  Outcome: Progressing  Flowsheets (Taken 11/30/2019 0649)  Tissue perfusion is adequate-venous:   Increase activity as tolerated / progressive mobility   Teach/review/reinforce ankle pump exercises   VTE  prevention: Administer anticoagulant(s) and/or apply anti-embolism stockings/devices as ordered    Note: Heparin subQ in place and administered this shift.      Problem: Compromised Hemodynamic Status  Goal: Vital signs and fluid balance maintained/improved  Outcome: Progressing  Flowsheets (Taken 11/30/2019 0649)  Vital signs and fluid balance are maintained/improved:   Position patient for maximum circulation/cardiac output   Monitor/assess vitals and hemodynamic parameters with position changes   Monitor and compare daily weight   Monitor intake and output. Notify LIP if urine output is less than 30 mL/hour.   Monitor/assess lab values and report abnormal values    Note: Continued to monitor patient vital signs and labs. Electrolyte replaced per orders this morning.

## 2019-11-30 NOTE — Plan of Care (Signed)
Problem: Psychosocial and Spiritual Needs  Goal: Demonstrates ability to cope with hospitalization/illness  Outcome: Not Progressing  Flowsheets (Taken 11/30/2019 2349)  Demonstrates ability to cope with hospitalizations/illness:   Provide quiet environment   Encourage verbalization of feelings/concerns/expectations     Problem: Safety  Goal: Patient will be free from injury during hospitalization  Outcome: Progressing  Flowsheets (Taken 11/30/2019 2349)  Patient will be free from injury during hospitalization:   Assess patient's risk for falls and implement fall prevention plan of care per policy   Hourly rounding   Provide and maintain safe environment   Assess for patients risk for elopement and implement Elopement Risk Plan per policy     Problem: Pain  Goal: Pain at adequate level as identified by patient  Outcome: Progressing  Flowsheets (Taken 11/30/2019 2349)  Pain at adequate level as identified by patient:   Identify patient comfort function goal   Assess for risk of opioid induced respiratory depression, including snoring/sleep apnea. Alert healthcare team of risk factors identified.   Assess pain on admission, during daily assessment and/or before any "as needed" intervention(s)   Reassess pain within 30-60 minutes of any procedure/intervention, per Pain Assessment, Intervention, Reassessment (AIR) Cycle     Problem: Compromised Hemodynamic Status  Goal: Vital signs and fluid balance maintained/improved  Outcome: Progressing  Flowsheets (Taken 11/30/2019 2349)  Vital signs and fluid balance are maintained/improved:   Position patient for maximum circulation/cardiac output   Monitor/assess vitals and hemodynamic parameters with position changes     Problem: Inadequate Gas Exchange  Goal: Adequate oxygenation and improved ventilation  Outcome: Progressing  Flowsheets (Taken 11/30/2019 2349)  Adequate oxygenation and improved ventilation:   Assess lung sounds   Monitor SpO2 and treat as needed

## 2019-11-30 NOTE — Progress Notes (Signed)
INFECTIOUS DISEASE PROGRESS NOTE       Antibiotics:    Zosyn #3  Micafungin #2  S/p vancomycin    Lines:    R IJ TLC 11/2  PIV  External urinary catheter    Assessment/Plan:    79 y/o male with Parkinson's disease, esophageal dysmotility, DM, CAD s/p CABG, HTN    Admitted with cough, fatigue, falls at home  Septic shock due to right lung empyema/necrotizing pneumonia with bacteremia  -cultures with Candida krusei, anaerobic GPC/Lactobacillus sp.  Chest tube placed with purulent output      1. Septic shock - due to bacteremia, empyema with GI tract organisms. I am concerned for esophageal perforation given chest tube output and organisms involved, rather than aspiration alone. Renal function improving. On lower pressors. WBC increased but not unexpected given 70% bandemia 2 days prior; this is improving.   Continue Zosyn and micafungin to cover the above organisms; C.krusei is inherently resistant to fluconazole.   GI following for planned EGD.   Chest tube management per IR/ICU.    Discussed with patient, his grandson and ICU staff.     Jillene Bucks, MD ([id number])  [facility]  [phone]    Subjective:    States he feels ok; denies pain. On room air. Remains on low-dose norepi 38mcg/min.     Physical Exam:      Vitals:    11/30/19 1130   BP: 112/54   Pulse: 89   Resp: (!) 33   Temp: 97.5 F (36.4 C)   SpO2: 97%   Vitals reviewed. Afebrile  General: awake, alert, elderly male, in NAD  HEENT: anicteric sclera  Heart: RRR  Lungs: diminished at right base; chest tube with purulent output  Abdomen: soft, NT, ND, +BS  Extremities: no edema; no rash  Lines: PIV; external urinary catheter; R IJ TLC    Labs:    Lab results reviewed.  Recent Labs     11/30/19  0221   WBC 18.52*   Hgb 10.3*   Hematocrit 28.8*   Platelets 281     Recent CMP   Recent Labs     11/30/19  0221   Glucose 168*   BUN 60*   Creatinine 1.7*   Sodium 140   Potassium 3.3*   Chloride 106   CO2 19*       Microbiology:    All  microbiology results reviewed.   11/4 blood cx: pending  11/2 urine cx: negative  11/2 pleural fluid cx: C.krusei; Lactobacillus gasseri  11/2 mrsa screen negative  11/2 covid/flu negative  11/1 blood cx: Lactobacillus gasseri/GPC 2/2 sets    Radiology:    All radiological procedures reviewed.   Radiology Results (24 Hour)     Procedure Component Value Units Date/Time    XR Chest AP Portable [[id number]] Collected: 11/30/19 1030    Order Status: Completed Updated: 11/30/19 1035    Narrative:      History: Right empyema. Exam compared prior chest CT study 11/29/2019.    FINDINGS:  Portable AP view. 1012.  Since last exam, the right pleural tube has been repositioned, with  guidewire now removed and pleural tube remaining coiled in the lower  right hemithorax. There is a minimally increasing, moderate lateral  right pneumothorax. Dense consolidation persists in the right mid and  lower chest. There is also moderate consolidation in the left lower  chest. Postoperative changes are again noted from previous coronary  artery bypass. Heart is normal size.  Right venous catheter is present  with tip in the SVC.      Impression:       Right pleural tube now in the inferior right hemithorax,  with guidewire removed. Increasing moderate lateral right pneumothorax,  with persistent dense consolidation in the right mid and lower chest.  Remainder as above.    Wilmon Pali, MD   11/30/2019 10:33 AM    CT Chest W PO [[id number]] Collected: 11/29/19 1608    Order Status: Completed Updated: 11/29/19 1636    Narrative:      History: Leukocytosis and anemia. Parkinson's dementia. Sepsis, empyema  with necrotizing pneumonia. Rule out esophageal perforation.    COMPARISON: Unenhanced chest CT yesterday    TECHNIQUE: CT esophagram without and with oral contrast. The following  dose reduction techniques were utilized: Automated exposure control  and/or adjustment of the mA and/or kV according to patient size, and the  use of iterative  reconstruction technique.    FINDINGS: Oral contrast ingestion was markedly limited in this patient  with dementia, limiting detail. No oral contrast extravasation is  observed. The esophageal dilation has improved since yesterday, and  debris in the esophageal lumen yesterday has passed. Right greater than  left lung consolidation persists. A right pleural catheter remains, and  a small right hydropneumothorax has increased. A guidewire remains in  the right pleural catheter after additional review of yesterday's and  today's studies, the wire tip extending to the posterior upper right  hemithorax, the wire pulled back somewhat since yesterday when it  extended to the apex. No pneumomediastinum has developed. A small  layering left pleural effusion remains low in density.    The heart size is normal. Coronary arteries are calcified. The patient  is post CABG. The thoracic aorta is atherosclerotic but not aneurysmal.      Impression:        1. Limited oral contrast ingestion limiting detail, but no extravasation  or other confirmation of esophageal perforation. Luminal findings  yesterday represented debris within a dilated esophagus. The debris has  now passed, and the air distended esophagus is less dilated.  2. The guidewire remains within the right pleural catheter. Increased  small right hydropneumothorax. Unchanged small left pleural effusion.    Urgent findings were discussed with Dr. Eston Mould at 16:40, and with Doristine Section, PA by secure chat.    Nelta Numbers, MD   11/29/2019 4:34 PM              [place] ([id number])   [facility]   [phone]

## 2019-11-30 NOTE — Consults (Signed)
Nutrition Assessment    [name] [name] 79 y.o. male   MRN: [id number]      Reason for Assessment: MD Verbal Consult: TPN recommendations    Nutrition Recommendation:  Goal: Meet nutrition needs via PN  Plan:   1) Recommend initiate 1.8L non-standard TPN (335g Dextrose, 90g AA, 50g SMOF lipid) GIR: 3.1mg /kg/min   2) Had initial conversation with son regarding pts interest in long-term nutrition options   3) Pt will likely eventually need PEG once stabilized   4) D/C plasmalyte with PN initiation    Donetta Potts RD, CNSC  Clinical Nutrition Manager  Ext. [phone]    ______________________________________________________________________      Assessment Data:  Summary:   79yo male admitted with cough and recurrent falls. PMH significant for parkinson's disease, DM2, CKD, HLD, CAD s/p stent/CABG, PAD s/p femoral bypass, depression, gout, esophageal dysmotility. Found to have empyema and multifocal PNA, CT placed in ED with significant purulent drainage. Per notes, RLL PNA is possibly d/t necrotizing PNA from aspiration vs. Esophageal perforation to pleural space resulting in abscess/empyema. GI Consulted for large distended esophagus, recommending EGD once more stable (once off pressors/amio) and recommending CTS consult. Consulted for TPN assessment. Palliative care following.    Pt seen with sitter present in room, son also present with whom the patient resides. Pt is alert and oriented to self, on small dose levophed. Denies N/V, no abdominal pain. Abdomen is flat, soft, NT/ND +hypoactive BS +BM x2 so far today. Right posterior CT with ~253ml brown thick drainage in pleuravac. Per son, UBW ~152-160# and has been stable, c/w current weight 164#. Reports he eats a regular diet at home and takes ensure 1x/day typically. Appetite was good until 10/30 when he wasn't eating as well, but he did take 2-3 Ensure 10/30-11/1. Pt now NPO x3 days. No peripheral edema present.    Per son, at baseline pt is able dress, shower and  ambulate up/down 3 flights of stairs on his own and is very independent. Asked if pt and son have had discussions surrounding PEG placement in the past given h/o Parkinson's and per son, they have but in a more general sense and less definitely, but his feeling is that pt would want it if it would be beneficial. Son is also agreeable to IV nutrition.     Spoke with intensivist regarding possiblity of jejunal vs. Gastric feeding tube placement by GI during EGD. Per discussion with intensivist, would not recommend additional feeding tube placements given current issues which could worsen. D/w intensivist ? Of ability to give parkinson's medications as well as advanced age and pt already likely aspirating and would likely require a PEG vs. JT anyway either by IR or GI- intensivist is in agreement, but doesn't feel this is the right time with all the acute issues pt is currently experiencing.    Weight Monitoring Weight Weight Method   04/06/2019 78.2 kg    05/12/2019 76.658 kg    06/14/2019 76.204 kg    10/06/2019 79.017 kg    11/01/2019 82.3 kg Bed Scale   11/14/2019 71.668 kg    11/28/2019 69.6 kg    11/28/2019 74.7 kg Bed Scale     Adm dx:  Empyema of lung   Patient Active Problem List   Diagnosis   . Parkinson's disease   . Diabetes mellitus type 2, controlled   . Mild episode of recurrent major depressive disorder   . Neutrophilic leukocytosis   . Low back pain   .  Hypertension   . Hepatic cyst   . Dyslipidemia   . Gouty arthropathy   . Diffuse esophageal spasm   . Depression   . CAD (coronary artery disease)   . Anosmia   . Benign essential hypertension   . Angina pectoris   . Nocturia   . Optic neuritis   . Other hyperlipidemia   . Pulmonary nodule, left   . S/P CABG (coronary artery bypass graft)   . Shoulder joint pain   . Tremor   . Ureteric stone   . Urge incontinence   . Chest pain   . Normocytic anemia   . Esophageal dysphagia   . Empyema of lung     PMH:  has a past medical history of Coronary artery disease  (bypass 26 years ago), Diabetes mellitus type 2, controlled, Diabetic nephropathy associated with type 2 diabetes mellitus, Diabetic nephropathy associated with type 2 diabetes mellitus, Hepatic cyst, Hyperlipidemia, Hypertension (30 years ago), Normocytic anemia, Parkinsons, and Stented coronary artery.    Recent Labs   Lab 11/30/19  0221 11/29/19  0353 11/28/19  0502 11/27/19  2311   Sodium 140 138 136 134*   Potassium 3.3* 3.3* 3.8 3.3*   Chloride 106 103 100 93*   CO2 19* 23 22 16*   BUN 60* 55* 46* 47*   Creatinine 1.7* 1.9* 2.1* 2.5*   Glucose 168* 167* 163* 190*   Calcium 8.4 8.4 8.7 10.1   Magnesium 2.4 2.3 2.2  --    EGFR 39.2 34.5 30.7 25.1   WBC 18.52* 16.17* 24.05* 23.57*   Hematocrit 28.8* 26.7* 29.8* 36.7*   Hgb 10.3* 9.5* 10.6* 12.6   Bilirubin, Total  --   --   --  1.1   AST (SGOT)  --   --   --  16   ALT  --   --   --  <6   Alkaline Phosphatase  --   --   --  80       Recent Labs   Lab 11/30/19  0737 11/30/19  0354 11/29/19  2333 11/29/19  1956 11/29/19  1543   Whole Blood Glucose POCT 166* 174* 148* 142* 133*       Current Facility-Administered Medications   Medication Dose Route Frequency   . heparin (porcine)  5,000 Units Subcutaneous Q8H SCH   . insulin lispro  1-5 Units Subcutaneous Q4H SCH   . micafungin  100 mg Intravenous Q24H   . pantoprazole  40 mg Intravenous Daily   . piperacillin-tazobactam  4.5 g Intravenous Q8H     . amiodarone 0.5 mg/min (11/30/19 0923)   . norepinephrine (LEVOPHED) infusion 1 mcg/min (11/30/19 1122)   . Plasma-Lyte A 100 mL/hr at 11/30/19 0927     PRN meds given in the past 48 hours: liquifilm tears    Social History:       Resides with son    Intake History:    See above                                                                       Orders Placed This Encounter      Diet NPO effective now    Orders Placed This Encounter  Procedures   . Diet NPO effective now     Food intake: NPO    No Known Allergies    Nutrition Focused Physical Exam:  Head - Per son,  intermittent food getting stuck/coughing with PO  Upper Body - moderate depletion of fat/muscle stores, but c/w age and disease process  Lower Body - significant depletion of stores, but at baseline per d/w family  Edema - none  GI function - see above    Learning Needs: see above    Anthropometrics  Height: 180.3 cm (5\' 11" )  Weight: 74.7 kg (164 lb 10.9 oz)  Weight Change: 7.33  IBW/kg (Calculated) Male: 78.21 kg  IBW/kg (Calculated) Male: 70.43 kg  BMI (calculated): 23    Total Daily Energy Needs: 1814.4 to 2177.28 kcal  Method for Calculating Energy Needs: 25 kcal - 30 kcal per kg  at 72.576 kg (Usual body weight)       Total Daily Protein Needs: 79.8336 to 101.6064 g  Method for Calculating Protein Needs: 1.1 g - 1.4 g per kg at 72.576 kg (Usual body weight)       Total Daily Fluid Needs: 1814.4 to 2177.28 ml  Method for Calculating Fluid Needs: 25 ml - 30 ml  per kg at 72.576 kg (Usual body weight)       Nutrition Diagnosis:     Altered GI function related to ?esophageal perforation as evidenced by CT scan and CT output    Intervention:  Goal: Meet nutrition needs via PN  Plan:   1) Recommend initiate 1.8L non-standard TPN (335g Dextrose, 90g AA, 50g SMOF lipid) GIR: 3.1mg /kg/min   2) Had initial conversation with son regarding pts interest in long-term nutrition options   3) Pt will likely eventually need PEG once stabilized    Monitoring/Evaluation:   1. PO intake  2. Weights  3. GI symptoms      Donetta Potts, RD

## 2019-11-30 NOTE — Progress Notes (Addendum)
[facility] ICU Multidisciplinary Rounds   11/30/2019  9:07 AM    Team Members: [x]  MD: Armenta__ [x]  Charge RN [x]  RN  []  Pharmacist []  Dietician []  NP/PA []  CM/SW []  Clinical Director []  RT [x]  Palliative Care        Daily IP ICU Checklist:   Central Line: [x]  CVC [x]  CVC day: _2__  CVC location: [x]  IJ []  SC []  Femoral []  other: ___   []  D/C CVC []  Change line   []  CVC indication: ______  IV Infusions:  Current Facility-Administered Medications   Medication   . amiodarone   . norepinephrine (LEVOPHED) infusion     . amiodarone 1 mg/min (11/30/19 0845)   . norepinephrine (LEVOPHED) infusion 2 mcg/min (11/30/19 0758)     VAP Bundle:   Respiratory Status: [x]  RA []  Nasal Cannula []  BiPAP []  HFNC []  Ventilator  Settings:________________  []  HOB elevated 30 degrees  []  Oral Care   []  Plan for SAT/SBT today  GI: Ulcer Prophylaxis: []  Not indicated []  H2 blocker  [x]  PPI    []  Tube feeds []  Tolerating Feeds []  TPN [x]  NPO []  Diet:____  CAUTI Prevention: []  indwelling foley catheter [x]  External cath    Foley Catheter day: _____  []  Foley indication: _______    []  Foley Secured   []  MD order in   Integumentary/VTE Prophylaxis: [x]  SCD  Chemical PPX: []  SQ lovenox [x]  SQ heparin  []  On hold:___   []  Wounds/WOCN Consult       Other:    []  Other Consults:_______  []  Family/Social:___ []  Family Meeting date: ___  []  PT/OT needed  []  Speech Therapy  []  Restraint orders  []  Glucose Controlled   []  Insulin Protocol: []  DKA []  Adult ICU Protocol []    [x]  Labs and Radiologic Studies Reviewed  []  Plan for transfer/discharge?:______     Notes:    Unable to take po - possible EGD today  repleted electrolytes   Repeat blood cultures  Chest tube continues to put out pus like substance  CV surgery consulted - wanted GI to scope first  Start IV fluids and give albumin  Hold heparin for EGD  Start TPN for nutrition

## 2019-11-30 NOTE — Transfer of Care (Signed)
Anesthesia Transfer of Care Note    Patient: [name] [name]    Procedures performed: Procedure(s):  EGD    Anesthesia type: General TIVA    Patient location:ICU    Last vitals:   Vitals:    11/30/19 1421   BP:    Pulse: 88   Resp:    Temp:    SpO2:        Post pain: Patient not complaining of pain, continue current therapy      Mental Status:sedated    Respiratory Function: tolerating room air    Cardiovascular: stable    Nausea/Vomiting: patient not complaining of nausea or vomiting    Hydration Status: adequate    Post assessment: no apparent anesthetic complications and no reportable events    Signed by: Juanell Fairly, MD  11/30/19 3:11 PM

## 2019-11-30 NOTE — Anesthesia Preprocedure Evaluation (Addendum)
Anesthesia Evaluation    AIRWAY    Mallampati: II    TM distance: >3 FB  Neck ROM: full  Mouth Opening:full  Planned to use difficult airway equipment: No CARDIOVASCULAR    cardiovascular exam normal, regular and normal       DENTAL         PULMONARY    pulmonary exam normal and clear to auscultation     OTHER FINDINGS                  Relevant Problems   CARDIO   (+) Angina pectoris   (+) Benign essential hypertension   (+) CAD (coronary artery disease)   (+) Hypertension   (+) S/P CABG (coronary artery bypass graft)      GU/RENAL   (+) Hepatic cyst      ENDO   (+) Diabetes mellitus type 2, controlled      OTHER   (+) Gouty arthropathy       PSS Anesthesia Comments: DM , Parkinson's disease, , CAD, Hx of CABG HTN, Anemia, , PVD/ Hx of fem/pop  Bypass, Major depression- recurrent episodes, , Diffuse esophageal sapsm , Tremor, Emphysema, COPD, Bill pneumonia, chest tube ,Susputious to esophageal perforation- CT scan rule out  Perforation, . Sepsis, Esophageal dysmotility, Enlarged esophagus, ECG accelaerated rhytham with PVC's  Myocardial perfusion test- 10/15/2021_- EF 64 %        Anesthesia Plan    ASA 3 - emergent     general                     intravenous induction   Detailed anesthesia plan: general IV  Monitors/Adjuncts: other    Post Op: other  Trial extubation is not planned.  Post op pain management: per surgeon    informed consent obtained    Plan discussed with CRNA.                   Signed by: Juanell Fairly, MD 11/30/19 1:19 PM

## 2019-11-30 NOTE — Plan of Care (Signed)
Problem: Nutrition  Goal: Nutritional intake is adequate  Outcome: Progressing  Flowsheets (Taken 11/30/2019 1657)  Nutritional intake is adequate:  Marland Kitchen Monitor daily weights  . Encourage/perform oral hygiene as appropriate  . Encourage/administer dietary supplements as ordered (i.e. tube feed, TPN, oral, OGT/NGT, supplements)  . Include patient/patient care companion in decisions related to nutrition  . Consult/collaborate with Clinical Nutritionist     Problem: Altered GI Function  Goal: Nutritional intake is adequate  Outcome: Progressing  Flowsheets (Taken 11/30/2019 1657)  Nutritional intake is adequate:  Marland Kitchen Monitor daily weights  . Encourage/perform oral hygiene as appropriate  . Encourage/administer dietary supplements as ordered (i.e. tube feed, TPN, oral, OGT/NGT, supplements)  . Include patient/patient care companion in decisions related to nutrition  . Consult/collaborate with Clinical Nutritionist  Goal: Fluid and electrolyte balance are achieved/maintained  Outcome: Progressing  Flowsheets (Taken 11/30/2019 1657)  Fluid and electrolyte balance are achieved/maintained:  . Monitor intake and output every shift  . Monitor/assess lab values and report abnormal values  . Provide adequate hydration  . Assess for confusion/personality changes  . Monitor daily weight  . Assess and reassess fluid and electrolyte status  EGD completed at bedside, Pt's son updated by Dr. Lollie Marrow. Attempted to wean off levophed, pt became hypotensive despite starting albumin and maintenance fluids. Pt confused, restless, sitter at bedside, started on precedex gtt.

## 2019-11-30 NOTE — H&P (Signed)
GI PRE PROCEDURE NOTE    Proceduralist Comments:   Review of Systems and Past Medical / Surgical History performed: Yes     Indications:Dysphagia with abnormal CT     Previous Adverse Reaction to Anesthesia or Sedation (if yes, describe): No    Physical Exam / Laboratory Data (If applicable)   General: Lethargic, fatigued appearing  Lungs: Lungs clear to auscultation  Cardiac: RRR, normal S1S2.    Abdomen: Soft, non tender. Normal active bowel sounds  Other:       Recent CBC   Recent Labs     11/30/19  0221   Hgb 10.3*   Hematocrit 28.8*   Platelets 281     Recent BMP   Recent Labs     11/30/19  0221   Glucose 168*   BUN 60*   Creatinine 1.7*   Calcium 8.4   Sodium 140   Potassium 3.3*   Chloride 106   CO2 19*     Recent CMP   Recent Labs     11/30/19  0221   Glucose 168*   BUN 60*   Creatinine 1.7*   Sodium 140   CO2 19*   Calcium 8.4     Recent PT/PTT No results for input(s): INR, PTT in the last 24 hours.    Invalid input(s): PTI, COUM, COUMP, ACOAG, ACOAP  Recent PT/INR No results for input(s): INR in the last 24 hours.    Invalid input(s): PTI, COUM, COUMP    American Society of Anesthesiologists (ASA) Physical Status Classification:   Anesthesia ASA Score: see anesthesia note          Planned Sedation:   Deep sedation with anesthesia    Attestation:   [name] [name] has been reassessed immediately prior to the procedure and is an appropriate candidate for the planned sedation and procedure. Risks, benefits and alternatives to the planned procedure and sedation have been explained to the patient or guardian:  yes        Signed by: Francisca December, MD

## 2019-11-30 NOTE — Progress Notes (Signed)
Case Management   Progression of Care:  Situation:  Patient Name:[name] [name],[name] [name]   LOS:2   Admitting ZO:XWRUEAV of lung [J86.9]  Pneumothorax on right [J93.9]     This Clinical research associate received order for [employer] GIP level of care. Referral placed.    Dewaine Conger, RN BSN MPA CCM, ACM-RN  Clinical Case Manager II  [facility]  Case Management Department  Phone: [phone]  Fax:     [phone]

## 2019-11-30 NOTE — Progress Notes (Signed)
Critical Care Daily Progress Note       Active Hospital Problems    Diagnosis   . Empyema of lung   . Esophageal dysphagia        Impression and Plan:  1. Septic shock / Right chest empyema - On low dose levophed (currently at 5 mcg/min).  No indication for stress dose steroids. Non invasive BP monitoring. Continue empiric antibiotics (zosyn, micafungin). CT output ~820 cc / 24 hrs, continue suction. Blood culture w/ Lactobacillus. Pleural fluid w/ Lactobacillus and Candida.  2. RLL pneumonia - Possible necrotizing pneumonia from aspiration vs esophageal distension and perforation to right pleural space w/ resulting abscess / empyema. 14 Fr right pigtail inserted in ER. Continue zosyn and micafungin. ID following.  3. Esophageal dysmotility - Seen by GI as outpatient (Dr Dorthy Cooler). EGD today. Continue PPI. Keep NPO, start TPN. GI following. Goals of care discussion w/ family.  4. AKI - Due to shock. Borderline urine output. Renally dosed meds. Avoid nephrotoxic agents. Poor candidate for RRT. Maintenance IVF w/ plasma lyte.  5. Acute anemia - No evidence of blood loss, no indication for transfusion. Continue to monitor daily CBC. Plt 288.  6. CAD - S/P 4vCABG in 2006. Resume medical management when able.  7. Acute delirium / Parkinson's disease - Resume medical management when able. Currently NPO. Start precedex.  8. Code Status - do not resuscitate    Medications:  Current Facility-Administered Medications   Medication Dose Route Frequency   . heparin (porcine)  5,000 Units Subcutaneous Q8H SCH   . insulin lispro  1-5 Units Subcutaneous Q4H SCH   . micafungin  100 mg Intravenous Q24H   . pantoprazole  40 mg Intravenous Daily   . piperacillin-tazobactam  4.5 g Intravenous Q8H       Infusions:  . Adult Central Parenteral Nutrition (TPN) non-standard 2-in-1      And   . SMOFLIPID (soy-MCT-olive-fish oil)     . amiodarone 0.5 mg/min (11/30/19 1223)   . dexmedeTOMIDine     . norepinephrine (LEVOPHED) infusion 5 mcg/min  (11/30/19 1348)   . Plasma-Lyte A 100 mL/hr at 11/30/19 1200       Past 24 hour events significant for: Pt is confused, agitated, anxious. Unable to follow commands at this moment. On low dose levophed. Oxygenating well on RA. Unable to assess ROS. Patient has parkison's disease.       Physical Examination:  BP (!) 88/52   Pulse 100   Temp 97.5 F (36.4 C) (Oral)   Resp (!) 24   Ht 1.803 m (5\' 11" )   Wt 74.7 kg (164 lb 10.9 oz)   SpO2 97%   BMI 22.97 kg/m       NECK: Supple; No JVD  LUNGS: Absent breath sounds to right base. Otherwise clear to auscultation, no wheezing.  HEART: RRR no MGR  ABD: NABS; SNT; active bowel sounds  EXT: No Edema; Peripheral pulses intact; Cap refill normal  NEURO: Awake and alert; follows commands; Grossly non-focal  LINES: RIJ TLC  PROPHY: UFH, PPI    Labs and x-rays reviewed    Critical care time excluding procedures = 40 min    Greater than 50% spent in counseling and coordination of care regarding current medical management and future plan of care

## 2019-11-30 NOTE — Consults (Signed)
[phone] / Available 24 hours    Hospice Informational Visit:    Date Time: 11/30/19 6:16 PM   Patient Name: [name] [name], [name] [name]   Requesting Physician: Inez Pilgrim, Domenica Reamer,*   Present at Visit: [name] [name],[name] [name]   Is Patient Alert and Oriented: []  YES     [x]  NO       Visit Outcome:   ECIN received for hospice info session. Collaborated with case manager, palliative provider and attending MD . Met with the patient's son, [name] [name],[name] [name]. The patient unable to participate with discussion due to disease progression, and being symptomatic. Discussed hospice services ; explained care philosophy and financial provision through Medicare. Shared with him about the Hospice care team and how [facility] will provide support to the patient and family.  He stats he's very familiar with hospice as his mother passed away under hospice care in the past.  She said, his sister and aunt are on their way to the hospital and the family wish is ensuring his father comfort. He was very emotional and crying thought an info session.  Much needed emotional support provided. [name] [name],[name] [name] ( son) singed consent in person. Due to work of breath that's requiring high flew oxygen, the patient meets criteria for GIP level of care. Attending to discuss with the patient's family further with a goal of care and will flip GIP level of care / transfer to [facility] tomorrow if the patient is medically stable to transport.         Hospice Care Guide Provided:  YES []   NO []    Hospice Certification of Illness Completed:  YES [x]   NO []    Hospice Consent Signed:  YES [x]   NO []      Plan:   Will continue to monitor patient care and status while patient is in the hospital. Will coordinate any discharge needs with CM and family    Hospice DME:   N/A    Next Steps:   Coordinate GIP admission with the patient's family, case manger and primary team       Signed by: Brand Males, RN    Thank you,  Orion Crook [facility] RN [facility]  Clinical  Liaison  [address]  [place], [place] [address]  [phone] : Mobile  [phone]: Office  [email or web address]  Our Mission  Provide Patients and Their Families with Advanced Illness Care of the Highest Quality.

## 2019-12-01 ENCOUNTER — Inpatient Hospital Stay
Admission: AD | Admit: 2019-12-01 | Discharge: 2019-12-27 | DRG: 951 | Disposition: E | Payer: No Typology Code available for payment source | Source: Intra-hospital | Attending: Family Medicine | Admitting: Family Medicine

## 2019-12-01 ENCOUNTER — Encounter: Payer: Self-pay | Admitting: Student in an Organized Health Care Education/Training Program

## 2019-12-01 DIAGNOSIS — E1151 Type 2 diabetes mellitus with diabetic peripheral angiopathy without gangrene: Secondary | ICD-10-CM | POA: Diagnosis present

## 2019-12-01 DIAGNOSIS — J189 Pneumonia, unspecified organism: Secondary | ICD-10-CM | POA: Diagnosis present

## 2019-12-01 DIAGNOSIS — K2289 Other specified disease of esophagus: Secondary | ICD-10-CM | POA: Diagnosis present

## 2019-12-01 DIAGNOSIS — J869 Pyothorax without fistula: Secondary | ICD-10-CM | POA: Diagnosis present

## 2019-12-01 DIAGNOSIS — E1169 Type 2 diabetes mellitus with other specified complication: Secondary | ICD-10-CM

## 2019-12-01 DIAGNOSIS — Z79899 Other long term (current) drug therapy: Secondary | ICD-10-CM

## 2019-12-01 DIAGNOSIS — R633 Feeding difficulties, unspecified: Secondary | ICD-10-CM | POA: Diagnosis present

## 2019-12-01 DIAGNOSIS — F458 Other somatoform disorders: Secondary | ICD-10-CM | POA: Diagnosis present

## 2019-12-01 DIAGNOSIS — I2510 Atherosclerotic heart disease of native coronary artery without angina pectoris: Secondary | ICD-10-CM | POA: Diagnosis present

## 2019-12-01 DIAGNOSIS — E1121 Type 2 diabetes mellitus with diabetic nephropathy: Secondary | ICD-10-CM | POA: Diagnosis present

## 2019-12-01 DIAGNOSIS — R451 Restlessness and agitation: Secondary | ICD-10-CM | POA: Diagnosis present

## 2019-12-01 DIAGNOSIS — Z515 Encounter for palliative care: Principal | ICD-10-CM

## 2019-12-01 DIAGNOSIS — R6521 Severe sepsis with septic shock: Secondary | ICD-10-CM | POA: Diagnosis present

## 2019-12-01 DIAGNOSIS — Z955 Presence of coronary angioplasty implant and graft: Secondary | ICD-10-CM

## 2019-12-01 DIAGNOSIS — R079 Chest pain, unspecified: Secondary | ICD-10-CM

## 2019-12-01 DIAGNOSIS — J9690 Respiratory failure, unspecified, unspecified whether with hypoxia or hypercapnia: Secondary | ICD-10-CM | POA: Diagnosis present

## 2019-12-01 DIAGNOSIS — Z8249 Family history of ischemic heart disease and other diseases of the circulatory system: Secondary | ICD-10-CM

## 2019-12-01 DIAGNOSIS — Z951 Presence of aortocoronary bypass graft: Secondary | ICD-10-CM

## 2019-12-01 DIAGNOSIS — Z9049 Acquired absence of other specified parts of digestive tract: Secondary | ICD-10-CM

## 2019-12-01 DIAGNOSIS — I10 Essential (primary) hypertension: Secondary | ICD-10-CM | POA: Diagnosis present

## 2019-12-01 DIAGNOSIS — M109 Gout, unspecified: Secondary | ICD-10-CM | POA: Diagnosis present

## 2019-12-01 DIAGNOSIS — G20 Parkinson's disease: Secondary | ICD-10-CM | POA: Diagnosis present

## 2019-12-01 DIAGNOSIS — Z7982 Long term (current) use of aspirin: Secondary | ICD-10-CM

## 2019-12-01 DIAGNOSIS — I209 Angina pectoris, unspecified: Secondary | ICD-10-CM

## 2019-12-01 DIAGNOSIS — Z87442 Personal history of urinary calculi: Secondary | ICD-10-CM

## 2019-12-01 DIAGNOSIS — R430 Anosmia: Secondary | ICD-10-CM

## 2019-12-01 DIAGNOSIS — E7849 Other hyperlipidemia: Secondary | ICD-10-CM | POA: Diagnosis present

## 2019-12-01 MED ORDER — DEXMEDETOMIDINE HCL IN NACL 200 MCG/50ML IV SOLN
0.0000 ug/kg/h | INTRAVENOUS | Status: DC
Start: 2019-12-01 — End: 2019-12-01

## 2019-12-01 MED ORDER — DEXMEDETOMIDINE HCL IN NACL 1000 MCG/250ML IV SOLN
0.0000 ug/kg/h | INTRAVENOUS | Status: DC
Start: 2019-12-01 — End: 2019-12-02
  Administered 2019-12-01: 14:00:00 0.8 ug/kg/h via INTRAVENOUS
  Filled 2019-12-01 (×2): qty 250

## 2019-12-01 MED ORDER — GLYCOPYRROLATE 0.2 MG/ML IJ SOLN (WRAP)
0.2000 mg | Freq: Three times a day (TID) | INTRAMUSCULAR | Status: DC | PRN
Start: 2019-12-01 — End: 2019-12-03

## 2019-12-01 MED ORDER — MORPHINE SULFATE IN DEXTROSE 100-5 MG/100ML-% IV SOLN
5.0000 mg/h | INTRAVENOUS | Status: DC
Start: 2019-12-01 — End: 2019-12-02
  Administered 2019-12-02: 5 mg/h via INTRAVENOUS
  Filled 2019-12-01: qty 100

## 2019-12-01 MED ORDER — LORAZEPAM 2 MG/ML IJ SOLN
0.5000 mg | INTRAMUSCULAR | Status: DC | PRN
Start: 2019-12-01 — End: 2019-12-03
  Administered 2019-12-01: 18:00:00 0.5 mg via INTRAVENOUS
  Filled 2019-12-01: qty 1

## 2019-12-01 MED ORDER — POLYVINYL ALCOHOL 1.4 % OP SOLN
1.0000 [drp] | OPHTHALMIC | Status: DC | PRN
Start: 2019-12-01 — End: 2019-12-03
  Administered 2019-12-02 (×2): 1 [drp] via OPHTHALMIC
  Filled 2019-12-01 (×2): qty 15

## 2019-12-01 MED ORDER — MORPHINE SULFATE 2 MG/ML IJ/IV SOLN (WRAP)
2.0000 mg | Status: DC | PRN
Start: 2019-12-01 — End: 2019-12-03
  Administered 2019-12-02 (×2): 2 mg via INTRAVENOUS
  Filled 2019-12-01 (×2): qty 1

## 2019-12-01 MED ORDER — DEXMEDETOMIDINE HCL IN NACL 200 MCG/50ML IV SOLN
0.0000 ug/kg/h | INTRAVENOUS | Status: DC
Start: 2019-12-01 — End: 2019-12-02
  Filled 2019-12-01: qty 50

## 2019-12-01 NOTE — Plan of Care (Incomplete)
Pt is

## 2019-12-01 NOTE — Progress Notes (Signed)
Pt agitated and restless overnight. Precedex gtt was titrated as needed and morphine was given x2. Will continue to monitor. Chest tube output brown and purulent ~210ml.

## 2019-12-01 NOTE — Progress Notes (Signed)
[phone] / Available 24 hours      Date Time: 12/22/2019 2:26 PM  Patient Name: [name] [name]  Attending Physician: Brock Ra, MD  Inpatient Hospice Start of Care: 12/18/2019  1:14 PM  Inpatient Hospice Length of Stay: 1  Hospice Diagnosis: Sepsis r/t  empyema  Symptom Management Need: Agitation/work of breathing  Code Status: NO CPR  -  ALLOW NATURAL DEATH     Lab Results   Component Value Date    COVID Nasopharyngeal 11/28/2019    COVID Not Detected 11/28/2019       Primary Caregiver: Barron Vanloan, son [phone]  Patient & Family Goals of Care: Comfort    Hospice RN Assessment:   Pt remains with sitter for occasional pulling at equipment, agitation.  Precedex infusion continuing with plan to wean after other family members have been able to visit. Consents for hospice/GIP admission signed by son and pt admitted to GIP (General In Patient) hospice for symptom management: agitation and work of breathing. Son remains bedside and support provided.     Scheduled Meds:  Current Facility-Administered Medications   Medication Dose Route Frequency     Continuous Infusions:  . dexmedeTOMIDine     . dexmedeTOMIDine       PRN Meds:.glycopyrrolate, LORazepam, morphine, polyvinyl alcohol    There were no vitals filed for this visit.    Plan:   Pt admitted to [facility] [place] [facility] for symptom management of agitation and work of breathing.  Plan to wean pt from Precedex drip after other family arrives to see patient.        Disposition:   TBD        For symptom management needs, please contact [facility].  After 5pm daily, death pronouncement to be done by house MD.     Signed by: Caleen Essex, RN

## 2019-12-01 NOTE — Discharge Summary (Signed)
Discharge Summary    Date:12-28-19   Patient Name: [name] [name]  Attending Physician: Inez Pilgrim, Domenica Reamer,*    Date of Admission:   11/27/2019    Date of Discharge:   2019/12/28    Admitting Diagnosis:   Septic shock    Discharge Dx:     Principal Diagnosis (Diagnosis after study, that is chiefly responsible for admission to inpatient status): Empyema of lung, septic shock, pneumonia, AKI, esophageal necrosis and dysmotility, acute anemia, acute delirium, parkinson's disease    Treatment Team:   Treatment Team:   Attending Provider: Inez Pilgrim, Domenica Reamer, MD  Consulting Physician: Christene Slates, MD  Consulting Physician: Charlestine Night, MD     Procedures performed:   Radiology: all results in the last 7 days  CT Head WO Contrast    Result Date: 11/28/2019   1. No CT evidence of acute intracranial abnormality. 2. Cerebral volume loss, intracranial atherosclerosis and mild sequela of chronic small vessel ischemic disease. Waynard Edwards, MD  11/28/2019 11:37 AM    CT Chest WO Contrast    Result Date: 11/28/2019  1. Extensive loculated mottled air and tissue density in the medial right lower chest, of similar appearance and potentially contiguous to that within a massively distended esophagus. Differential diagnosis includes necrotizing pneumonia from aspiration, vs esophageal perforation with mediastinitis/abscess. 2. A right pleural tube is in place, currently with adjacent trace right pleural effusion and minimal right pneumothorax. 3. Consolidation involves both lower lobes. 4. Postoperative changes from previous coronary bypass. 5. Urgent findings discussed with and acknowledged by Dr. Eston Mould at 1:30 PM. Wilmon Pali, MD  11/28/2019 1:31 PM    CT Chest W PO    Result Date: 11/29/2019  1. Limited oral contrast ingestion limiting detail, but no extravasation or other confirmation of esophageal perforation. Luminal findings yesterday represented debris within a dilated esophagus. The debris  has now passed, and the air distended esophagus is less dilated. 2. The guidewire remains within the right pleural catheter. Increased small right hydropneumothorax. Unchanged small left pleural effusion. Urgent findings were discussed with Dr. Eston Mould at 16:40, and with Doristine Section, PA by secure chat. Nelta Numbers, MD  11/29/2019 4:34 PM    XR Chest AP Portable    Result Date: 11/30/2019   Right pleural tube now in the inferior right hemithorax, with guidewire removed. Increasing moderate lateral right pneumothorax, with persistent dense consolidation in the right mid and lower chest. Remainder as above. Wilmon Pali, MD  11/30/2019 10:33 AM    XR Chest AP Portable    Result Date: 11/28/2019  1. Stable right-sided chest tube with small residual right-sided pneumothorax. 2. Interval placement of a right central venous catheter with its distal tip terminating over the SVC. 3. Persistent patchy airspace opacities within the right mid to lower lung field. 4. Mild left basilar atelectasis. Judd Gaudier, MD  11/28/2019 3:01 AM    Chest AP Portable    Result Date: 11/28/2019  1. Interval placement of a right-sided chest tube with slight interval decrease in size of a small right-sided pneumothorax. 2. Persistent patchy consolidative airspace opacities within the right mid to lower lung field. 3. Mild left basilar atelectasis. Judd Gaudier, MD  11/28/2019 1:18 AM    Chest AP Portable    Result Date: 11/27/2019  Moderate right hydropneumothorax. Patchy consolidation in the right mid to lower lung and left lower lobe likely reflects multifocal pneumonia. These critical results were discussed with Dr. Melida Gimenez at 11/27/2019  11:33 PM. Theador Hawthorne, MD  11/27/2019 11:34 PM      Reason for Admission:   Septic shock, pneumonia  Hospital Course:     79 yo M w/ PMHx of PD, CAD s/p CABG, DM2, PAD s/p femoral bypass. Presented for evaluation of cough, fatigue, poor PO intake. Hypotensive in ER w/ RLL pneumonia and hydropneumothorax. Started  sepsis treatment, covered w/ broad spectrum antibiotics, chest tube placement w/ purulent drainage, volume resuscitation. Started on levophed. CT chest concerning for esophageal perforation to the pleural space vs necrotizing pneumonia. Blood cultures positive for Lactobacillus, pleural fluid culture positive for Lactobacillus and Candida. GI, ID, palliative care and thoracic surgery consulted. Antibiotics switched to zosyn and micafungin. Patient started becoming increasingly agitated and confused, diagnosed as acute delirium and was started on precedex drip. EGD on 11/4 showed diffuse mucosal necrosis / ischemia concerning for malignancy. Goals of care discussion w/ family decided to focus on comfort as options for treatment were very limited. Will transition to [facility] hospice today.    Condition at Discharge:   Moribund  Today:     BP (!) 78/47   Pulse (!) 51   Temp 97.6 F (36.4 C) (Axillary)   Resp 17   Ht 1.803 m (5\' 11" )   Wt 74.7 kg (164 lb 10.9 oz)   SpO2 96%   BMI 22.97 kg/m   Ranges for the last 24 hours:  Temp:  [97.2 F (36.2 C)-98.5 F (36.9 C)] 97.6 F (36.4 C)  Heart Rate:  [50-180] 51  Resp Rate:  [13-52] 17  BP: (57-154)/(38-76) 78/47    Last set of labs   Recent Labs   Lab 11/30/19  0221   WBC 18.52*   Hgb 10.3*   Hematocrit 28.8*   Platelets 281     Recent Labs   Lab 11/30/19  0221   Sodium 140   Potassium 3.3*   Chloride 106   CO2 19*   BUN 60*   Creatinine 1.7*   EGFR 39.2   Glucose 168*   Calcium 8.4       Micro / Labs / Path pending:     Unresulted Labs     Procedure . . . Date/Time    CULTURE BLOOD AEROBIC AND ANAEROBIC [[id number]] Collected: 11/30/19 1057    Specimen: Blood, Venipuncture Updated: 11/30/19 1405    Narrative:      The order will result in two separate 8-46ml bottles  Please do NOT order repeat blood cultures if one has been  drawn within the last 48 hours  UNLESS concerned for  endocarditis  AVOID BLOOD CULTURE DRAWS FROM CENTRAL LINE IF  POSSIBLE  Indications:->Sepsis  Indications:->Bacteremia  1 BLUE+1 PURPLE    CULTURE BLOOD AEROBIC AND ANAEROBIC [[id number]] Collected: 11/30/19 1057    Specimen: Blood, Venipuncture Updated: 11/30/19 1405    Narrative:      The order will result in two separate 8-21ml bottles  Please do NOT order repeat blood cultures if one has been  drawn within the last 48 hours  UNLESS concerned for  endocarditis  AVOID BLOOD CULTURE DRAWS FROM CENTRAL LINE IF POSSIBLE  Indications:->Sepsis  Indications:->Bacteremia  1 BLUE+1 PURPLE          Discharge Instructions For Providers     1. [facility] hosp[facility]    Discharge Instructions:       Discharge Diet: NPO  Wound 11/01/19 Moisture Associated Skin Damage (MASD) Buttocks non-blanching redness, incontinence (Active)   Number of days: 29  CVC Triple Lumen 11/28/19 Right Internal jugular (Active)   Line necessity reviewed? Vasopressor administration/hemodynamics 12/02/2019 0600   Site Assessment Clean; Dry; Intact 12/14/2019 0600   Proximal Lumen Status Infusing; Capped 12/21/2019 0600   Medial Lumen Status Capped 12/13/2019 0600   Distal Lumen Status Capped 12/02/2019 0600   Line Care Connections checked and tightened 11/30/2019 0600   Dressing Type Transparent; Biopatch 12/04/2019 0600   Dressing Status Clean; Dry; Intact 12/05/2019 0600   Dressing/Line Intervention Fluid bag changed 11/29/19 2000   Biopatch Used? ([facility] Only) Yes 12/23/2019 0600   Curos Caps Used? ([facility] Only) Yes 12/18/2019 0600   End Caps Free From Blood Yes 12/14/2019 0600   Line Used For Blood Draw No 12/12/2019 0600   Dressing Change Due 12/04/19 11/30/19 2000   Cap Change Due 12/02/19 11/30/19 2000   Number of days: 3       Peripheral IV 11/29/19 20 G Right Antecubital (Active)   Site Assessment Clean; Dry; Intact 12/20/2019 0600   Line Status Saline Locked 12/17/2019 0600   Tubing Dated? Yes 11/30/19 1800   Dressing Status Clean; Dry; Intact 12/02/2019 0600   Dressing Dated? Yes 11/30/2019 0600   Dressing Change Due 12/06/19 11/30/19 2000    Reason Not Rotated Not due 12/13/2019 0600   Number of days: 1     Disposition:  Hospice    Minutes spent coordinating discharge and reviewing discharge plan: 30 minutes      Signed by: Su Hoff, MD

## 2019-12-01 NOTE — H&P (Signed)
[facility] Admission H&P  Physician Available 24 hours a day:  Please use number in sticky note to contact hospital team.  Office#: [phone]     Date Time: 12/07/2019 4:38 PM  Patient Name: [name] [name]  Attending Physician: Brock Ra, MD  Primary Care Physician: [name], MD    CC: Admission for hospice care     Assessment:     Active Hospital Problems    Diagnosis   . Admission for hospice care   . Empyema of lung   . Parkinson's disease   . Hypertension     79 y.o. male with known known Parkinson's, DMII, CAD s/p CABG 2006, PAD s/p femoral bypass admitted for inpatient hospice management after recent deterioration.     Plan:   - continue comfort care measures  - continue precedex drip; wean as able after starting morphine  - will start morphine drip this evening at 5 mg/h; titrate for agitation and air hunger  - PRN ativan 0.5 mg  - PRN robinul 0.2 mg q8h PRN; can increase frequency to q4-6h if needed  - oral care, PRN liquid tears  - [facility] consulting for further symptom management for end of life care    #Chronic: stop all chronic medications    #FEN - Diet NPO effective now      CODE STATUS: NO CPR  -  ALLOW NATURAL DEATH     DISPO:  Based on the information available on the day of this admission, patient will be admitted to service status: inpatient hospice    History of Presenting Illness:   [name] [name] is a 79 y.o. male with known Parkinson's, DMII, CAD s/p CABG 2006, PAD s/p femoral bypass admitted for inpatient hospice management after recent deterioration.     Patient originally presented 11/1 after 2 weeks of progressive symptoms and was admitted to the ICU for septic shock due to multifocal pneumonia with R sided empyema, likely aspiration in the setting of dysphagia with necrotizing component of pneumonia vs esophageal perforation. Patient was treated with broad spectrum antibiotics/antifungals and pressors and was on HFNC for respiratory  support. CT tube placed.     GI consulted for possible esophageal involvement, for which upper endoscopy was performed showing necrosis/ischemia due to likely malignancy. After goals of care with family, ICU team, and palliative care, family decided to proceed with hospice.     Course c/b significant agitation for which he was started on precedex drip.     Currently, patient remains sedated on precedex. Son at bedside states he appears much more comfortable than prior. Requests that any changes to sedation be made after planned family visit today.     Past Medical History:     Past Medical History:   Diagnosis Date   . 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 Diabetes mellitus type 2, controlled    . Diabetic nephropathy associated with type 2 diabetes mellitus    . Diabetic nephropathy associated with type 2 diabetes mellitus    . Hepatic cyst    . Hyperlipidemia    . Hypertension 30 years ago   . Normocytic anemia    . Parkinsons    . Stented coronary artery        Past Surgical History:     Past Surgical History:   Procedure Laterality Date   . CHOLECYSTECTOMY  1990s   . CORONARY ARTERY BYPASS GRAFT  1990s   .  EGD N/A 11/30/2019    Procedure: EGD;  Surgeon: Francisca December, MD;  Location: [facility];  Service: Gastroenterology;  Laterality: N/A;   . FEMORAL BYPASS  1994       Family History:     Family History   Problem Relation Age of Onset   . Hypertension Mother    . Hypertension Father        Social History:     Social History     Tobacco Use   . Smoking status: Never Smoker   . Smokeless tobacco: Never Used   Vaping Use   . Vaping Use: Never used   Substance Use Topics   . Alcohol use: Yes     Alcohol/week: 2.0 standard drinks     Types: 2 Cans of beer per week   . Drug use: Never        Allergies:   No Known Allergies    Medications:     Current Discharge Medication List      CONTINUE these medications which have NOT CHANGED    Details   allopurinol (ZYLOPRIM) 300 MG tablet TAKE 1 TABLET BY  MOUTH EVERY DAY  Qty: 30 tablet, Refills: 0    Associated Diagnoses: Gout, unspecified cause, unspecified chronicity, unspecified site      Ascorbic Acid (VITAMIN C PO) Take 1,000 mg by mouth      aspirin EC 81 MG EC tablet Take 81 mg by mouth daily         atorvastatin (LIPITOR) 20 MG tablet Take 2 tablets (40 mg total) by mouth nightly  Qty: 60 tablet, Refills: 0      carbidopa-levodopa (Rytary) 36.25-145 MG Cap CR per capsule Take 3 capsules by mouth every 4 (four) hours At 0800, 1200, 1600, 2000, 0000      Coenzyme Q10 (CoQ10) 200 MG Cap Take 400 mg by mouth      glucosamine-chondroitin 500-400 MG tablet Take 1 tablet by mouth      losartan (COZAAR) 25 MG tablet TAKE 1 TABLET BY MOUTH EVERY DAY  Qty: 90 tablet, Refills: 4    Associated Diagnoses: Essential hypertension      nitroglycerin (NITROSTAT) 0.4 MG SL tablet Place 1 tablet (0.4 mg total) under the tongue every 5 (five) minutes as needed for Chest pain  Qty: 25 tablet, Refills: 2    Associated Diagnoses: Chest pain, unspecified type      oxybutynin (DITROPAN-XL) 10 MG 24 hr tablet Take 1 tablet (10 mg total) by mouth daily  Qty: 30 tablet, Refills: 6      pantoprazole (PROTONIX) 40 MG tablet Take 1 tablet (40 mg total) by mouth every morning before breakfast  Qty: 30 tablet, Refills: 0      sertraline (ZOLOFT) 25 MG tablet TAKE 1 TABLET BY MOUTH EVERY DAY  Qty: 90 tablet, Refills: 1    Associated Diagnoses: Type 2 diabetes mellitus without complication, without long-term current use of insulin      sucralfate (CARAFATE) 1 g tablet Take 1 tablet (1 g total) by mouth 4 (four) times daily  Qty: 40 tablet, Refills: 0              Review of Systems:   All other systems were reviewed and are negative except: per HPI    Physical Exam:     VITAL SIGNS: Physical Exam:   Temp:  [97.2 F (36.2 C)-97.8 F (36.6 C)] 97.8 F (36.6 C)  Heart Rate:  [50-114] 50  Resp Rate:  [  13-41] 16  BP: (70-116)/(40-76) 83/47  There is no height or weight on file to calculate  BMI.  No intake or output data in the 24 hours ending 11/30/2019 1638 GEN: chronically ill appearing, does not respond to voice  HEENT: mouth dry  CARDS: RRR, no murmurs, rubs or gallops  LUNGS: comfortable WOB on RA  ABD: soft, non-tender, non-distended  EXT: no clubbing, cyanosis, or edema       Labs Reviewed:     Results     ** No results found for the last 24 hours. **        Imaging Reviewed:   Imaging results from ICU course reviewed    Signed by: Pernell Dupre, MD  Office #: [phone]     UJ:[id number], Pryor Ochoa, MD

## 2019-12-01 NOTE — Plan of Care (Signed)
Noted transition to inpatient hospice and discontinuation of antibiotics.     We will be available if further ID questions.     Discussed with Dr. Eston Mould.     Jillene Bucks, MD ([id number])  [facility]  [phone]

## 2019-12-02 LAB — ORGANISM REFERRED FOR IDENTIFICATION, AEROBIC BACTERIA

## 2019-12-02 MED ORDER — MORPHINE SULFATE IN DEXTROSE 100-5 MG/100ML-% IV SOLN
2.5000 mg/h | INTRAVENOUS | Status: DC
Start: 2019-12-02 — End: 2019-12-03
  Administered 2019-12-03: 11:00:00 2.5 mg/h via INTRAVENOUS
  Filled 2019-12-02 (×2): qty 100

## 2019-12-02 NOTE — Plan of Care (Signed)
Problem: Dyspnea  Goal: Dyspnea is at a manageable effort  Outcome: Progressing  Flowsheets (Taken 12/02/2019 0843)  Dyspnea is at a manageable effort:   Cool fan near patient, if available   Position for comfort   Opioid infusion to decrease respiratory distress, if ordered  Note: Pt off Morphine since 0600. RR 16, patient calm, no distress noted, no excess secretions noted at this time. Eye drops PRN, oral care performed and repositioning pt as needed. MD to follow up with family today with plan of care.

## 2019-12-02 NOTE — Progress Notes (Signed)
[phone] / Available 24 hours      Date Time: 12/02/19 2:23 PM  Patient Name: [name] [name]  Attending Physician: Brock Ra, MD  Inpatient Hospice Start of Care: 12/24/2019  1:14 PM  Inpatient Hospice Length of Stay: 2  Hospice Diagnosis: sepsis, empyema of lung  Symptom Management Need: Agitation, pain  Code Status: NO CPR  -  ALLOW NATURAL DEATH     Lab Results   Component Value Date    COVID Nasopharyngeal 11/28/2019    COVID Not Detected 11/28/2019       Primary Caregiver: Sergey Ishler, son [phone]  Patient & Family Goals of Care: Comfort    Hospice RN Assessment:   Pt unresponsive to verbal, tactile stimuli. Occasional furrow/grimace observed on face with UE tremors. IV Morphine continuous infusion restarted at at 2.5 mg/hour. No family present when visited.     Scheduled Meds:  Current Facility-Administered Medications   Medication Dose Route Frequency     Continuous Infusions:  . morphine 2.5 mg/hr (12/02/19 1220)     PRN Meds:.glycopyrrolate, LORazepam, morphine, polyvinyl alcohol    Vitals:    12/02/19 1200   BP: 103/51   Pulse: 73   Resp: 15   Temp:    SpO2: 97%       Plan:   Continue with [facility] for symptom management of agitation and pain.       Disposition:   TBD          Signed by: Caleen Essex, RN

## 2019-12-02 NOTE — Plan of Care (Signed)
Problem: Dyspnea  Goal: Dyspnea is at a manageable effort  Outcome: Progressing  Flowsheets (Taken 12/02/2019 0153)  Dyspnea is at a manageable effort:   Position for comfort   Opioid infusion to decrease respiratory distress, if ordered   Anxiolytics for anxiety, if ordered

## 2019-12-02 NOTE — Plan of Care (Signed)
Problem: Safety  Goal: Patient will be free from injury during hospitalization  Flowsheets (Taken 12/02/2019 2229)  Patient will be free from injury during hospitalization:  . Assess patient's risk for falls and implement fall prevention plan of care per policy  . Use appropriate transfer methods  . Provide and maintain safe environment  . Ensure appropriate safety devices are available at the bedside  . Include patient/ family/ care giver in decisions related to safety     Problem: Pain  Goal: Pain at adequate level as identified by patient  Flowsheets (Taken 12/02/2019 2229)  Pain at adequate level as identified by patient:  . Identify patient [name] [name] function goal  . Offer non-pharmacological pain management interventions  . Evaluate if patient [name] [name] function goal is met    Pt is on [name] [name] care. Pt receiving intravenous morphine infusion. Repositioning in place. Pt has a condom cath and chest tube. Pt's son with pt at this time. Safety precautions in place. Will continue to monitor.

## 2019-12-02 NOTE — Progress Notes (Signed)
Patient transferred to <<491>> at <1900> via <bed>>.   Hand off report given to <<Lauren>>.   Patient/ family <<at bedside>> notified regarding transfer. All belongings sent with patient (eye drops). Vital signs stable and appropriate for transfer.   Handoff completed with receiving RN/CT <<Lauren>> at bedside; morphine gtt hand off.   Family called to update on room 33, spoke with Onalee Hua.

## 2019-12-02 NOTE — Progress Notes (Signed)
[facility] Progress Note  Physician Available 24 hours a day:  Please call number in sticky note to reach hospital team.  Office#: [phone]     Date Time: 12/02/19 9:00 AM  Patient Name: [name] [name]  Attending Physician: Brock Ra, MD  Primary Care Physician: [name], MD    CC: Admission for hospice care    Assessment:   Principal Problem:    Admission for hospice care  Active Problems:    Parkinson's disease    Hypertension    Empyema of lung  Resolved Problems:    * No resolved hospital problems. *    79 y.o. male PMHx significant for Parkinson's, DMII, CAD s/p CABG 2006, PAD s/p femoral bypass admitted for inpatient hospice management.    Plan:   - continue comfort care measures  - discontinue precedex, continue morphine gtt at 2.5 mg/h  - transfer to general medical floor for further monitoring; no longer requires ICU level of care  - PRN ativan 0.5 mg  - PRN robinul 0.2 mg q8h PRN; can increase frequency to q4-6h if needed  - continue PRN morphine 2 mg IV for discomfort/air hunger/tremors not controlled with morphine drip  - oral care, PRN liquid tears  - [facility] consulting for further symptom management for end of life care    #Chronic: stop all chronic medications    #FEN - Diet NPO effective now      CODE STATUS: NO CPR  -  ALLOW NATURAL DEATH     DISPO:  Today's date: 12/02/2019  Admit Date: 2019/12/27  1:14 PM  Anticipated medical stability for discharge:no estimation, inpatient hospice  Service status: Inpatient: Due to: active management of comfort measures in hospice setting  Anticipated discharge needs: n/a    Subjective:     Interval History/HPI:     24 hour events: morphine drip initiated with the goal of weaning off precedex. Precedex off at 2340. Morphine off at 0600.     Seen with history per bedside ICU RN. Patient calm and comfortable based on nonverbal markers of pain and respiratory rate. Oral care and turns; does have some tremors with  repositioning but otherwise no concerns from nursing. BP has been stable in 70s-80s/50-60s.     Review of Systems:     Unable to obtain, not responsive    Physical Exam:     VITAL SIGNS: Physical Exam:   Temp:  [98.2 F (36.8 C)-98.9 F (37.2 C)] 98.9 F (37.2 C)  Heart Rate:  [50-151] 67  Resp Rate:  [13-34] 13  BP: (61-95)/(32-55) 79/44      Intake/Output Summary (Last 24 hours) at 12/02/2019 0900  Last data filed at 12/02/2019 0800  Gross per 24 hour   Intake 28.13 ml   Output 310 ml   Net -281.87 ml    GEN: not responsive to voice  HEENT:  Dry mucous membranes  CARDS: regular rate and rhythm, no m/r/g  LUNGS: comfortable respiratory rate on room air. Breathing through mouth. L lung with basilar wheeze, R lung base with diminished breath sounds. R sided chest tube in place draining seropurulent fluid. ABD: soft, non-tender, non-distended.   EXT: no edema    Foley in place draining dark yellow urine     Current Meds:     Current Facility-Administered Medications   Medication Dose Route Frequency     Current Facility-Administered Medications   Medication Dose Route Frequency Last Rate     Current  Facility-Administered Medications   Medication Dose Route   . glycopyrrolate  0.2 mg Intravenous   . LORazepam  0.5 mg Intravenous   . morphine  2 mg Intravenous   . polyvinyl alcohol  1 drop Both Eyes     Labs:     Labs (last 24 hours):  Results     ** No results found for the last 24 hours. **        Imaging (last 24 hours), reviewed and are significant for:  No results found.    Signed by: Pernell Dupre, MD   Office#: [phone]     UJ:[id number], Pryor Ochoa, MD

## 2019-12-04 LAB — LAB USE ONLY - HISTORICAL SURGICAL PATHOLOGY

## 2019-12-06 LAB — ORGANISM REFERRED FOR IDENTIFICATION, AEROBIC BACTERIA

## 2019-12-07 LAB — ORGANISM REFERRED FOR IDENTIFICATION, AEROBIC BACTERIA

## 2019-12-10 ENCOUNTER — Other Ambulatory Visit (INDEPENDENT_AMBULATORY_CARE_PROVIDER_SITE_OTHER): Payer: Self-pay | Admitting: Family Medicine

## 2019-12-10 DIAGNOSIS — M109 Gout, unspecified: Secondary | ICD-10-CM

## 2019-12-27 NOTE — Nursing Progress Note (Signed)
Called into room by Patient's family. Patient is starting to have 30 sec episodes of apnea. Patient does not appear in any distress. Emotional support given to family.

## 2019-12-27 NOTE — Progress Notes (Signed)
[phone] / Available 24 hours    Date Time: Dec 23, 2019 3:33 PM  Patient Name: [name] [name]  Attending Physician: Brock Ra, MD  Inpatient Hospice Start of Care: 12/19/2019  1:14 PM  Inpatient Hospice Length of Stay: 3  Hospice Diagnosis: Admission for hospice care  Symptom Management Need: pain, agitation  Code Status: NO CPR  -  ALLOW NATURAL DEATH     Lab Results   Component Value Date    COVID Nasopharyngeal 11/28/2019    COVID Not Detected 11/28/2019       Primary Caregiver: Finn Amos, son, [phone]  Patient & Family Goals of Care:  Pt comfort and peaceful passing    Situation:   79 y.o. male with past medical hx of Parkinson's disease, referred for hospice GIP level of care for symptom management of pain and restlessness.  DX: septic shock due to empyema.       Background:   Pt and his son Abisai share a home in [place]. Pt requiring more assistance over the last year, but still relatively independent.  Pt with a number of falls over the last few days PTA.  Pt was admitted to GIP on 11/5.  [place] - pt's wife died in hospice care 5 years ago and pt is very supportive of hospice philosophy.  Pt has two adult children, daughter Fannie Knee, and son Courtez.        Assessment:   Upon entering the room and introducing self, this Liaison noted that pt was not breathing.  Family aware and stated that pt stopped breathing at 2:15. Passing was peaceful.  Pt's sister also present- is a retired Armed forces technical officer from [employer]. Much support provided to pt's family.      Recommendations:   Reviewed [facility] bereavement services and next steps for care. Funeral Plans are in process- family will utilize [facility] in [place]- they are waiting a call back from them on who may be used locally.      Intervention/Plan:   Please call [facility] for any questions at [phone]    Thank you for the beautiful care of Mr. Montez Morita    For symptom management needs, please contact [facility].  After 5pm daily, death pronouncement to be done by house MD.     Signed by: Edyth Gunnels, LCSW   Clinical Social Worker/Hosital Liaison  [facility]

## 2019-12-27 NOTE — Progress Notes (Signed)
[facility] Progress Note  Physician Available 24 hours a day:  Please call number in sticky note to reach hospital team.  Office#: [phone]     Date Time: 12/24/2019 9:23 AM  Patient Name: [name] [name]  Attending Physician: Brock Ra, MD  Primary Care Physician: [name], MD    CC: Admission for hospice care    Assessment:   Principal Problem:    Admission for hospice care  Active Problems:    Parkinson's disease    Hypertension    Empyema of lung  Resolved Problems:    * No resolved hospital problems. *    79 y.o. male PMHx significant for Parkinson's, DMII, CAD s/p CABG 2006, PAD s/p femoral bypass admitted for inpatient hospice management. Remains comfortable on morphine gtt.     Plan:   - continue comfort care measures  - continue morphine gtt at 2.5 mg/h  - transfer to general medical floor for further monitoring; no longer requires ICU level of care  - PRN ativan 0.5 mg  - PRN robinul 0.2 mg q8h PRN; can increase frequency to q4-6h if needed  - continue PRN morphine 2 mg IV for discomfort/air hunger/tremors not controlled with morphine drip  - oral care, PRN liquid tears  - [facility] for further symptom management for end of life care    #Chronic: stop all chronic medications    #FEN - Diet NPO effective now      CODE STATUS: NO CPR  -  ALLOW NATURAL DEATH     DISPO:  Today's date: 12/17/2019  Admit Date: 2019/12/05  1:14 PM  Anticipated medical stability for discharge:no estimation, inpatient hospice  Service status: Inpatient: Due to: active management of comfort measures in hospice setting  Anticipated discharge needs: n/a    Subjective:     Interval History/HPI:     24 hour events: morphine drip restarted at steady rate. Transferred from ICU    Patient resting comfortable, HPI per notes and son, at bedside. Some agitation with alarms which calmed on its own. Chest tube remains in place.     Review of Systems:     Unable to obtain, not  responsive    Physical Exam:     VITAL SIGNS: Physical Exam:   Heart Rate:  [73-81] 76  Resp Rate:  [9-18] 9  BP: (100-122)/(50-58) 105/51      Intake/Output Summary (Last 24 hours) at 12/09/2019 0923  Last data filed at 12/05/2019 0981  Gross per 24 hour   Intake --   Output 900 ml   Net -900 ml    GEN: not responsive to voice  HEENT:  Dry mucous membranes  CARDS: regular rate and rhythm, no m/r/g  LUNGS: comfortable respiratory rate on room air. Breathing through mouth. L lung clear to auscultation, R lung base with diminished breath sounds. R sided chest tube in place draining seropurulent fluid. ABD: soft, non-tender, non-distended.   EXT: no edema    Foley in place draining dark yellow urine     Current Meds:     Current Facility-Administered Medications   Medication Dose Route Frequency     Current Facility-Administered Medications   Medication Dose Route Frequency Last Rate   . morphine  2.5 mg/hr Intravenous Continuous 2.5 mg/hr (12/02/19 1220)     Current Facility-Administered Medications   Medication Dose Route   . glycopyrrolate  0.2 mg Intravenous   . LORazepam  0.5 mg Intravenous   .  morphine  2 mg Intravenous   . polyvinyl alcohol  1 drop Both Eyes     Labs:     Labs (last 24 hours):  Results     ** No results found for the last 24 hours. **        Imaging (last 24 hours), reviewed and are significant for:  No results found.    Signed by: Pernell Dupre, MD   Office#: [phone]     UJ:[id number], Pryor Ochoa, MD

## 2019-12-27 NOTE — Death Summary (Signed)
DEATH SUMMARY NOTE     Date Time: 12/04/19 1:41 PM  Patient Name: [name] [name]  Attending Physician: No att. providers found  PCP: [name], MD    Date of Admission:   12-12-2019     Date and Time of Death:   2019/12/14 1524      Reason for Admission:   Septic shock [A41.9, R65.21], hospice inpatient needs     Hospital Problems:     Active Hospital Problems    Diagnosis   . Admission for hospice care   . Empyema of lung   . Parkinson's disease   . Hypertension      Cause of Death:   Respiratory failure, pneumonia     Diagnoses at Time of Death:     Patient Active Problem List    Diagnosis Date Noted   . Admission for hospice care 12-12-2019   . Feeding difficulties, unspecified 2019/12/12   . Empyema of lung 11/28/2019   . Normocytic anemia 11/02/2019   . Oropharyngeal dysphagia 11/02/2019   . Chest pain 11/01/2019   . Urge incontinence 10/06/2019   . 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 Neutrophilic leukocytosis 08/05/2015   . Dyslipidemia 08/05/2015   . Angina pectoris 08/05/2015   . S/P CABG (coronary artery bypass graft) 08/05/2015   . Benign essential hypertension 07/10/2015   . Parkinson's disease 04/25/2014   . Depression 04/25/2014   . Tremor 04/25/2014   . Pulmonary nodule, left 10/07/2013   . Diabetes mellitus type 2, controlled 08/28/2013   . Low back pain 08/28/2013   . Hypertension 08/28/2013   . Hepatic cyst 08/28/2013   . 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 Diffuse esophageal spasm 08/28/2013   . CAD (coronary artery disease) 08/28/2013   . Anosmia 08/28/2013   . Nocturia 08/28/2013   . Optic neuritis 08/28/2013   . Other hyperlipidemia 08/28/2013   . Shoulder joint pain 08/28/2013   . Ureteric stone 08/28/2013      Procedures Performed:   None      Complications:   None     Hospital Course / Key Events:   Hospice consulted for symptom management. Patient was transitioned from precedex gtt started in ICU to morphine gtt. He was transferred to the floor on a  stable dose with PRN morphine/ativan available. Over the next day his respiratory drive decreased and he passed peacefully.     Autopsy:   No Autopsy       Signed by: Pernell Dupre, MD

## 2019-12-27 NOTE — Death Pronouncement Note (Signed)
DEATH PRONOUNCEMENT NOTE    Date Time: 12/19/2019 4:36 PM  Patient Name: [name] [name]  Attending Physician: Laretta Alstrom, MD, MD    Time of Death: 65    Patient unresponsive to verbal & tactile stimuli.    Pupils non reactive.  Heart sounds absent.  Breath sounds absent.  No response to painful stimuli    Family present and appropriate.  Voices no need.      Death summary and death certificate as per Brock Ra, MD.    Case discussed with: RN to inform Primary attending.     Time spent > 15 minutes.     Signed,  Laretta Alstrom, MD  4:36 PM 12/14/2019

## 2019-12-27 NOTE — Plan of Care (Signed)
Problem: Safety  Goal: Patient will be free from injury during hospitalization  Outcome: Progressing  Flowsheets (Taken 12/21/2019 1101)  Patient will be free from injury during hospitalization: Provide and maintain safe environment  Goal: Patient will be free from infection during hospitalization  Outcome: Progressing  Flowsheets (Taken 11/29/2019 1101)  Free from Infection during hospitalization: Encourage patient and family to use good hand hygiene technique     Problem: Pain  Goal: Pain at adequate level as identified by patient  Outcome: Progressing  Flowsheets (Taken 12/04/2019 1101)  Pain at adequate level as identified by patient:   Identify patient comfort function goal   Evaluate if patient comfort function goal is met

## 2019-12-27 NOTE — Progress Notes (Signed)
Pt remains left medical floor 1815 per security with appropriate form.   PIV,  chest tube, skin dressings removed.

## 2019-12-27 DEATH — deceased

## 2020-01-09 ENCOUNTER — Ambulatory Visit
Admission: RE | Admit: 2020-01-09 | Payer: No Typology Code available for payment source | Source: Ambulatory Visit | Admitting: Student in an Organized Health Care Education/Training Program

## 2020-01-09 SURGERY — DONT USE, USE 1095-ESOPHAGOGASTRODUODENOSCOPY (EGD), DIAGNOSTIC
Anesthesia: Monitor Anesthesia Care | Site: Abdomen

## 2020-01-29 ENCOUNTER — Ambulatory Visit: Payer: No Typology Code available for payment source

## 2020-01-30 ENCOUNTER — Ambulatory Visit: Payer: No Typology Code available for payment source

## 2020-01-31 ENCOUNTER — Ambulatory Visit: Payer: No Typology Code available for payment source

## 2020-02-01 ENCOUNTER — Ambulatory Visit: Payer: No Typology Code available for payment source

## 2020-02-05 ENCOUNTER — Ambulatory Visit: Payer: No Typology Code available for payment source

## 2020-02-06 ENCOUNTER — Ambulatory Visit: Payer: No Typology Code available for payment source

## 2020-02-07 ENCOUNTER — Ambulatory Visit: Payer: No Typology Code available for payment source

## 2020-02-08 ENCOUNTER — Ambulatory Visit: Payer: No Typology Code available for payment source

## 2020-02-13 ENCOUNTER — Ambulatory Visit: Payer: No Typology Code available for payment source

## 2020-02-14 ENCOUNTER — Ambulatory Visit: Payer: No Typology Code available for payment source

## 2020-02-15 ENCOUNTER — Ambulatory Visit: Payer: No Typology Code available for payment source

## 2020-02-19 ENCOUNTER — Ambulatory Visit: Payer: No Typology Code available for payment source

## 2020-02-20 ENCOUNTER — Ambulatory Visit: Payer: No Typology Code available for payment source

## 2020-02-21 ENCOUNTER — Ambulatory Visit: Payer: No Typology Code available for payment source

## 2020-02-22 ENCOUNTER — Ambulatory Visit: Payer: No Typology Code available for payment source

## 2020-02-26 ENCOUNTER — Ambulatory Visit: Payer: No Typology Code available for payment source

## 2020-02-27 ENCOUNTER — Ambulatory Visit: Payer: No Typology Code available for payment source

## 2020-04-15 NOTE — Progress Notes (Signed)
deceased

## 2020-05-15 ENCOUNTER — Ambulatory Visit (INDEPENDENT_AMBULATORY_CARE_PROVIDER_SITE_OTHER): Payer: No Typology Code available for payment source | Admitting: Cardiovascular Disease
